# Patient Record
Sex: Female | Born: 1935 | Race: White | Hispanic: No | Smoking: Former smoker
Health system: Southern US, Community
[De-identification: ages and names within clinical notes are randomized; demographics above are authoritative.]

## PROBLEM LIST (undated history)

## (undated) DIAGNOSIS — I10 Essential (primary) hypertension: Secondary | ICD-10-CM

## (undated) DIAGNOSIS — K5790 Diverticulosis of intestine, part unspecified, without perforation or abscess without bleeding: Secondary | ICD-10-CM

## (undated) DIAGNOSIS — R911 Solitary pulmonary nodule: Secondary | ICD-10-CM

## (undated) DIAGNOSIS — F4310 Post-traumatic stress disorder, unspecified: Secondary | ICD-10-CM

## (undated) DIAGNOSIS — M8580 Other specified disorders of bone density and structure, unspecified site: Secondary | ICD-10-CM

## (undated) DIAGNOSIS — I499 Cardiac arrhythmia, unspecified: Secondary | ICD-10-CM

## (undated) DIAGNOSIS — K219 Gastro-esophageal reflux disease without esophagitis: Secondary | ICD-10-CM

## (undated) DIAGNOSIS — E785 Hyperlipidemia, unspecified: Secondary | ICD-10-CM

## (undated) DIAGNOSIS — D649 Anemia, unspecified: Secondary | ICD-10-CM

## (undated) DIAGNOSIS — A64 Unspecified sexually transmitted disease: Secondary | ICD-10-CM

## (undated) HISTORY — DX: Hyperlipidemia, unspecified: E78.5

## (undated) HISTORY — DX: Diverticulosis of intestine, part unspecified, without perforation or abscess without bleeding: K57.90

## (undated) HISTORY — DX: Essential (primary) hypertension: I10

## (undated) HISTORY — DX: Unspecified sexually transmitted disease: A64

## (undated) HISTORY — DX: Anemia, unspecified: D64.9

## (undated) HISTORY — PX: CERVICAL BIOPSY  W/ LOOP ELECTRODE EXCISION: SUR135

## (undated) HISTORY — DX: Solitary pulmonary nodule: R91.1

## (undated) HISTORY — PX: COLPOSCOPY: SHX161

## (undated) HISTORY — DX: Post-traumatic stress disorder, unspecified: F43.10

## (undated) HISTORY — DX: Other specified disorders of bone density and structure, unspecified site: M85.80

## (undated) HISTORY — PX: APPENDECTOMY: SHX54

## (undated) HISTORY — PX: TONSILLECTOMY: SUR1361

## (undated) HISTORY — PX: KNEE SURGERY: SHX244

## (undated) HISTORY — DX: Gastro-esophageal reflux disease without esophagitis: K21.9

---

## 1998-02-25 ENCOUNTER — Other Ambulatory Visit: Admission: RE | Admit: 1998-02-25 | Discharge: 1998-02-25 | Payer: Self-pay | Admitting: Obstetrics and Gynecology

## 1999-04-16 ENCOUNTER — Other Ambulatory Visit: Admission: RE | Admit: 1999-04-16 | Discharge: 1999-04-16 | Payer: Self-pay | Admitting: Obstetrics and Gynecology

## 1999-08-03 ENCOUNTER — Other Ambulatory Visit: Admission: RE | Admit: 1999-08-03 | Discharge: 1999-08-03 | Payer: Self-pay | Admitting: Obstetrics and Gynecology

## 1999-08-03 ENCOUNTER — Encounter (INDEPENDENT_AMBULATORY_CARE_PROVIDER_SITE_OTHER): Payer: Self-pay | Admitting: Specialist

## 2000-08-02 ENCOUNTER — Other Ambulatory Visit: Admission: RE | Admit: 2000-08-02 | Discharge: 2000-08-02 | Payer: Self-pay | Admitting: Obstetrics and Gynecology

## 2000-09-22 ENCOUNTER — Encounter: Admission: RE | Admit: 2000-09-22 | Discharge: 2000-09-22 | Payer: Self-pay | Admitting: Obstetrics and Gynecology

## 2000-09-22 ENCOUNTER — Encounter: Payer: Self-pay | Admitting: Obstetrics and Gynecology

## 2001-07-31 ENCOUNTER — Other Ambulatory Visit: Admission: RE | Admit: 2001-07-31 | Discharge: 2001-07-31 | Payer: Self-pay | Admitting: Obstetrics and Gynecology

## 2001-12-11 ENCOUNTER — Encounter: Payer: Self-pay | Admitting: Obstetrics and Gynecology

## 2001-12-11 ENCOUNTER — Encounter: Admission: RE | Admit: 2001-12-11 | Discharge: 2001-12-11 | Payer: Self-pay | Admitting: Obstetrics and Gynecology

## 2002-04-16 HISTORY — PX: FACIAL COSMETIC SURGERY: SHX629

## 2002-07-24 ENCOUNTER — Other Ambulatory Visit: Admission: RE | Admit: 2002-07-24 | Discharge: 2002-07-24 | Payer: Self-pay | Admitting: Obstetrics and Gynecology

## 2002-09-16 DIAGNOSIS — K5790 Diverticulosis of intestine, part unspecified, without perforation or abscess without bleeding: Secondary | ICD-10-CM

## 2002-09-16 HISTORY — DX: Diverticulosis of intestine, part unspecified, without perforation or abscess without bleeding: K57.90

## 2002-10-12 ENCOUNTER — Ambulatory Visit (HOSPITAL_COMMUNITY): Admission: RE | Admit: 2002-10-12 | Discharge: 2002-10-12 | Payer: Self-pay | Admitting: Gastroenterology

## 2002-10-12 ENCOUNTER — Encounter (INDEPENDENT_AMBULATORY_CARE_PROVIDER_SITE_OTHER): Payer: Self-pay | Admitting: Specialist

## 2002-12-18 ENCOUNTER — Encounter: Admission: RE | Admit: 2002-12-18 | Discharge: 2002-12-18 | Payer: Self-pay | Admitting: Obstetrics and Gynecology

## 2002-12-18 ENCOUNTER — Encounter: Payer: Self-pay | Admitting: Obstetrics and Gynecology

## 2003-08-15 ENCOUNTER — Other Ambulatory Visit: Admission: RE | Admit: 2003-08-15 | Discharge: 2003-08-15 | Payer: Self-pay | Admitting: Obstetrics and Gynecology

## 2003-10-03 ENCOUNTER — Encounter: Admission: RE | Admit: 2003-10-03 | Discharge: 2003-10-03 | Payer: Self-pay | Admitting: Internal Medicine

## 2004-05-22 ENCOUNTER — Encounter: Admission: RE | Admit: 2004-05-22 | Discharge: 2004-05-22 | Payer: Self-pay | Admitting: Internal Medicine

## 2004-05-27 ENCOUNTER — Encounter: Admission: RE | Admit: 2004-05-27 | Discharge: 2004-05-27 | Payer: Self-pay | Admitting: Internal Medicine

## 2004-08-19 ENCOUNTER — Other Ambulatory Visit: Admission: RE | Admit: 2004-08-19 | Discharge: 2004-08-19 | Payer: Self-pay | Admitting: Obstetrics and Gynecology

## 2005-07-07 ENCOUNTER — Encounter: Admission: RE | Admit: 2005-07-07 | Discharge: 2005-07-07 | Payer: Self-pay | Admitting: Internal Medicine

## 2005-09-14 ENCOUNTER — Other Ambulatory Visit: Admission: RE | Admit: 2005-09-14 | Discharge: 2005-09-14 | Payer: Self-pay | Admitting: Obstetrics and Gynecology

## 2005-10-07 ENCOUNTER — Encounter: Admission: RE | Admit: 2005-10-07 | Discharge: 2005-10-07 | Payer: Self-pay | Admitting: Obstetrics and Gynecology

## 2006-09-15 ENCOUNTER — Other Ambulatory Visit: Admission: RE | Admit: 2006-09-15 | Discharge: 2006-09-15 | Payer: Self-pay | Admitting: Obstetrics and Gynecology

## 2006-09-16 ENCOUNTER — Encounter: Admission: RE | Admit: 2006-09-16 | Discharge: 2006-09-16 | Payer: Self-pay | Admitting: Internal Medicine

## 2007-05-16 ENCOUNTER — Encounter: Admission: RE | Admit: 2007-05-16 | Discharge: 2007-06-08 | Payer: Self-pay | Admitting: Internal Medicine

## 2007-09-19 ENCOUNTER — Ambulatory Visit: Payer: Self-pay | Admitting: Family Medicine

## 2007-09-27 ENCOUNTER — Other Ambulatory Visit: Admission: RE | Admit: 2007-09-27 | Discharge: 2007-09-27 | Payer: Self-pay | Admitting: Obstetrics and Gynecology

## 2007-09-28 DIAGNOSIS — E669 Obesity, unspecified: Secondary | ICD-10-CM | POA: Insufficient documentation

## 2007-10-24 ENCOUNTER — Ambulatory Visit: Payer: Self-pay | Admitting: Family Medicine

## 2007-10-26 ENCOUNTER — Ambulatory Visit: Payer: Self-pay | Admitting: *Deleted

## 2007-11-02 ENCOUNTER — Encounter: Admission: RE | Admit: 2007-11-02 | Discharge: 2007-11-02 | Payer: Self-pay | Admitting: Internal Medicine

## 2007-11-28 ENCOUNTER — Encounter: Admission: RE | Admit: 2007-11-28 | Discharge: 2007-11-28 | Payer: Self-pay | Admitting: Internal Medicine

## 2007-12-20 ENCOUNTER — Encounter: Admission: RE | Admit: 2007-12-20 | Discharge: 2007-12-20 | Payer: Self-pay | Admitting: Gastroenterology

## 2007-12-20 IMAGING — RF DG ESOPHAGUS
15 of 18 series · 20 of 24 positions shown · non-contrast
Comparison: None

CLINICAL DATA: Dysphasia---- fluoroscopy time of 1.7-minute

BARIUM SWALLOW / ESOPHAGRAM

[Series 2: run · 1 of 1 slices shown (1 of 15)]
[im 1/1]
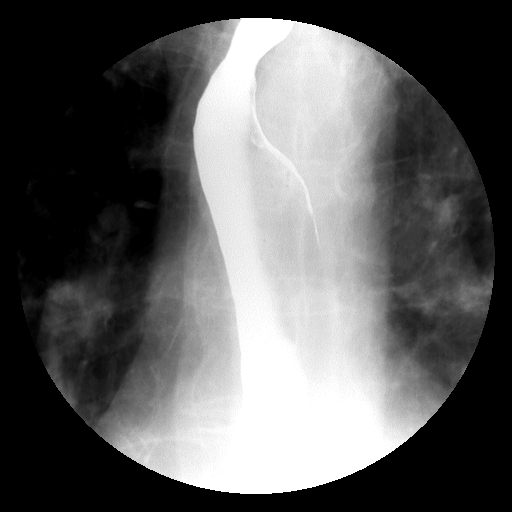

[Series 3: run · 1 of 1 slices shown (2 of 15)]
[im 1/1]
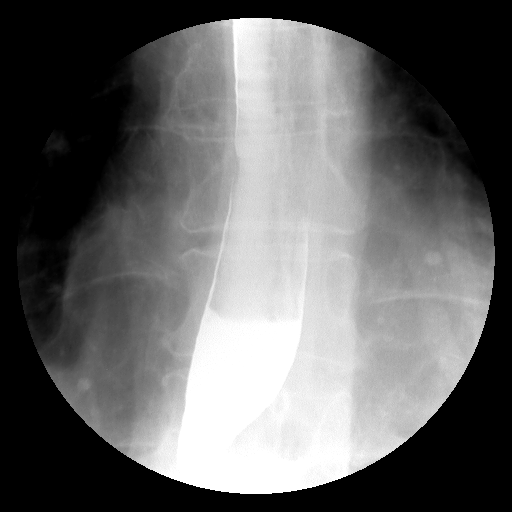

[Series 5: run · 1 of 1 slices shown (3 of 15)]
[im 1/1]
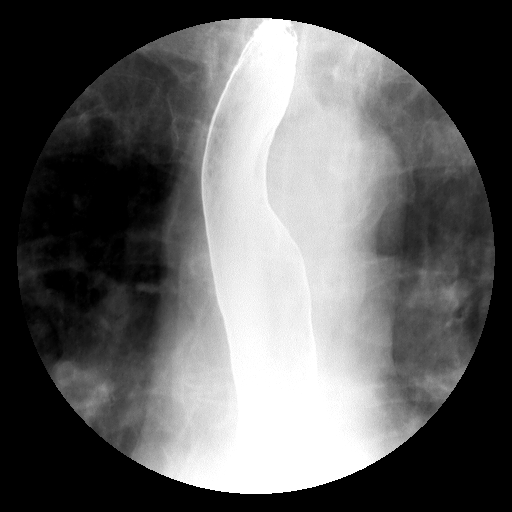

[Series 6: run · 1 of 1 slices shown (4 of 15)]
[im 1/1]
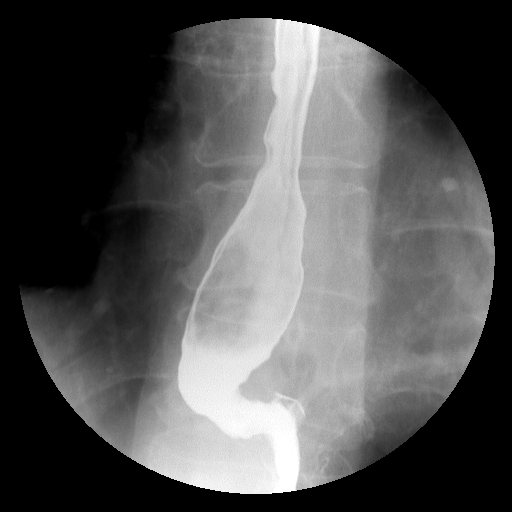

[Series 7: run · 3 of 7 slices shown (5 of 15)]
[im 1/7]
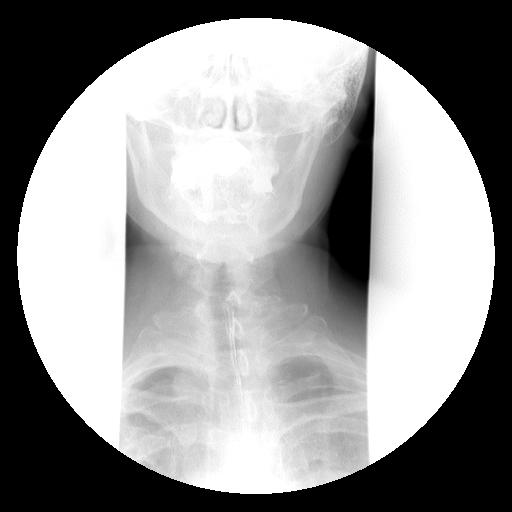
[im 3/7]
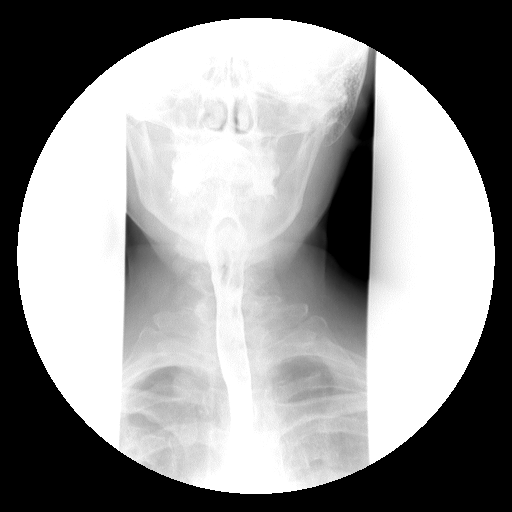
[im 5/7]
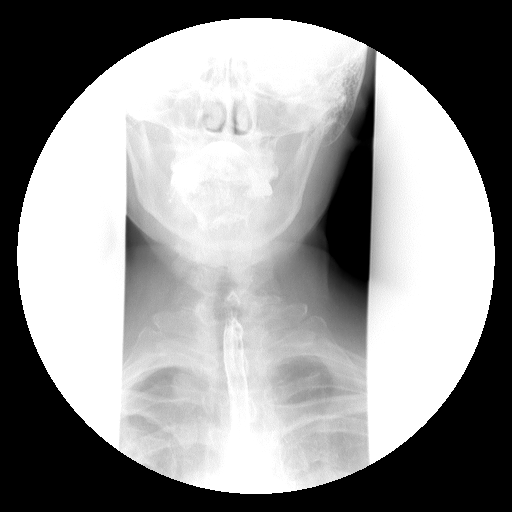

[Series 8: run · 4 of 6 slices shown (6 of 15)]
[im 1/6]
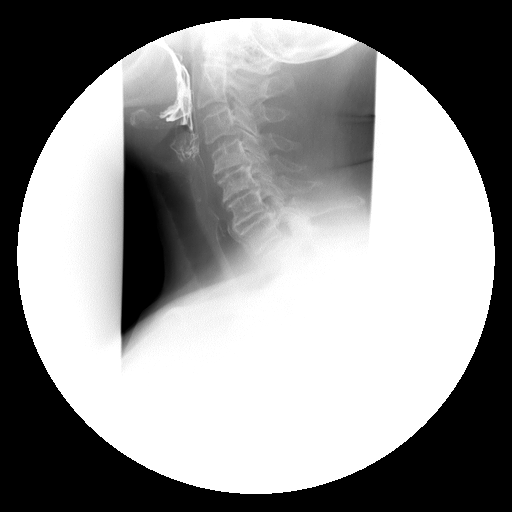
[im 2/6]
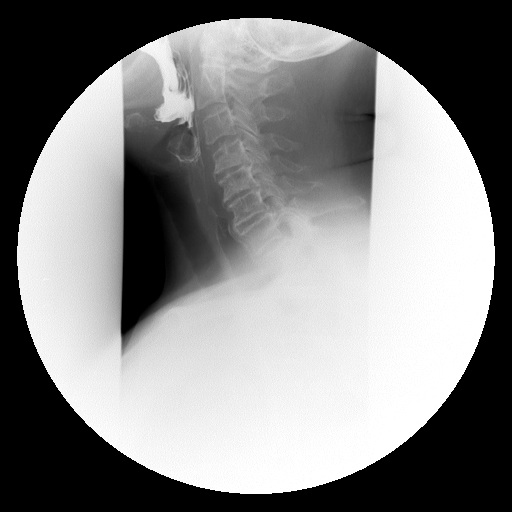
[im 4/6]
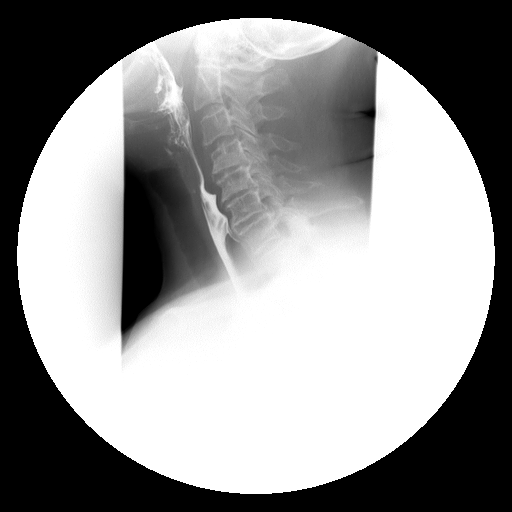
[im 6/6]
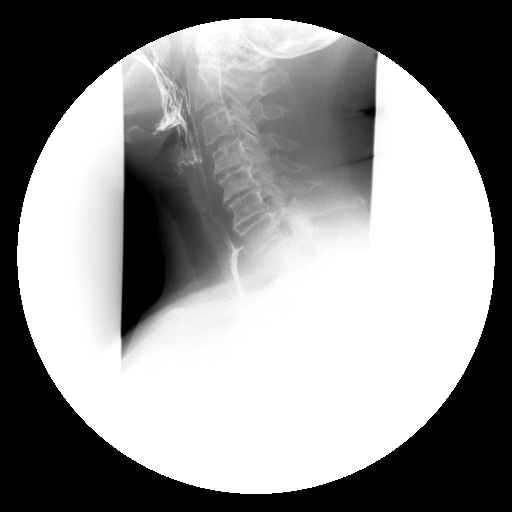

[Series 9: run · 1 of 1 slices shown (7 of 15)]
[im 1/1]
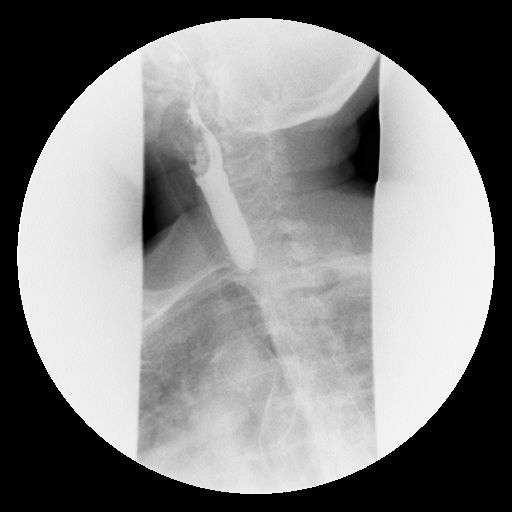

[Series 11: run · 1 of 1 slices shown (8 of 15)]
[im 1/1]
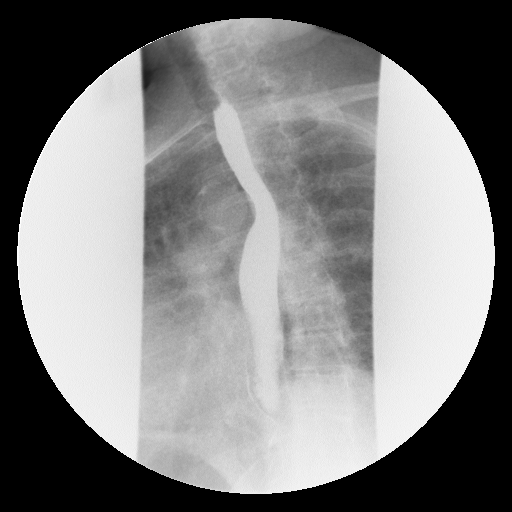

[Series 12: run · 1 of 1 slices shown (9 of 15)]
[im 1/1]
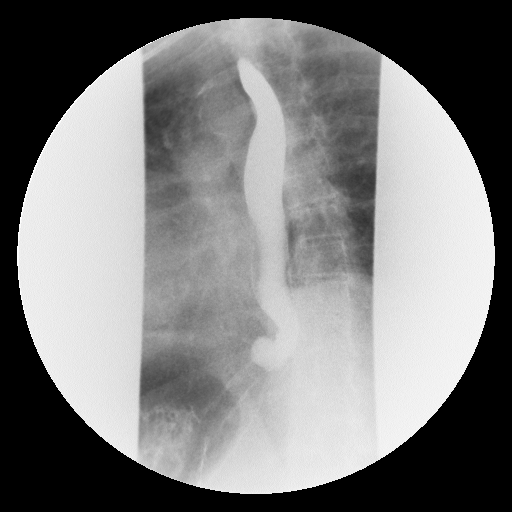

[Series 13: run · 1 of 1 slices shown (10 of 15)]
[im 1/1]
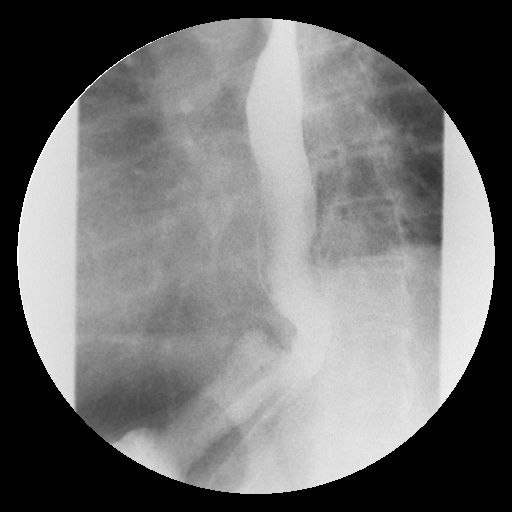

[Series 14: run · 1 of 1 slices shown (11 of 15)]
[im 1/1]
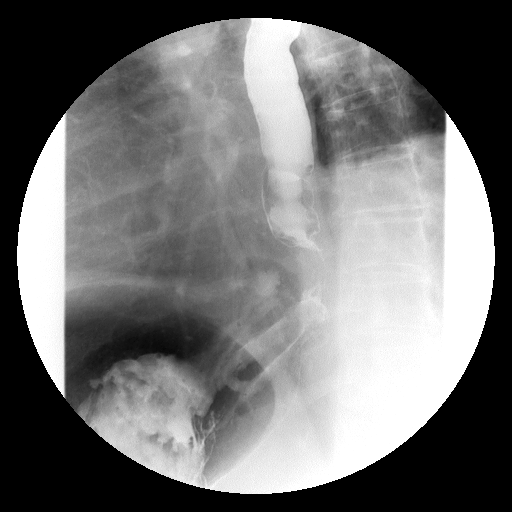

[Series 15: run · 1 of 1 slices shown (12 of 15)]
[im 1/1]
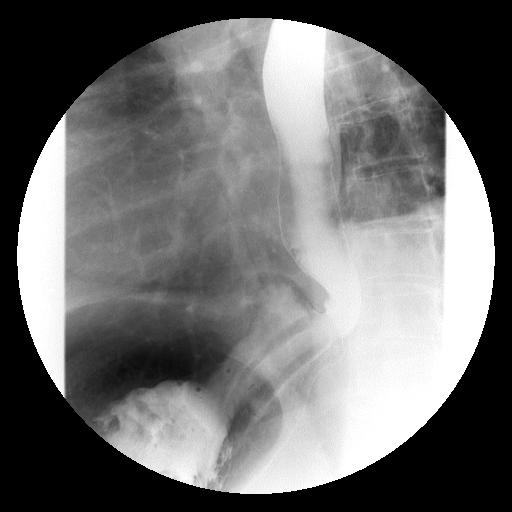

[Series 17: run · 1 of 1 slices shown (13 of 15)]
[im 1/1]
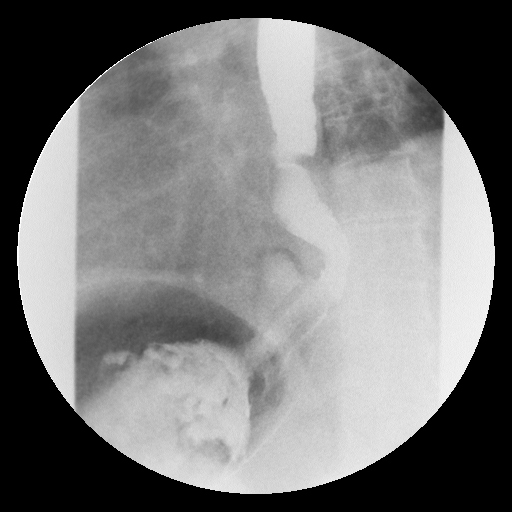

[Series 19: run · 1 of 1 slices shown (14 of 15)]
[im 1/1]
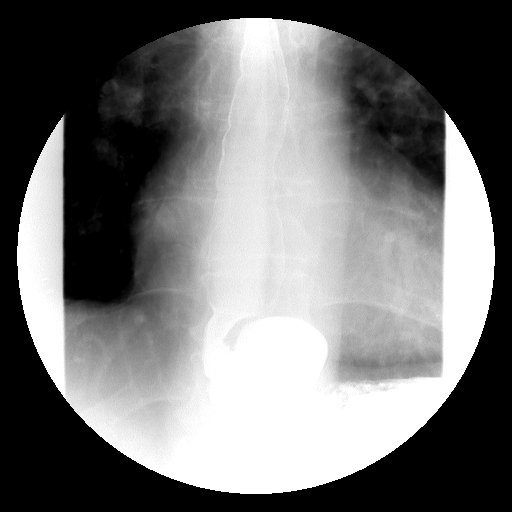

[Series 20: run · 1 of 1 slices shown (15 of 15)]
[im 1/1]
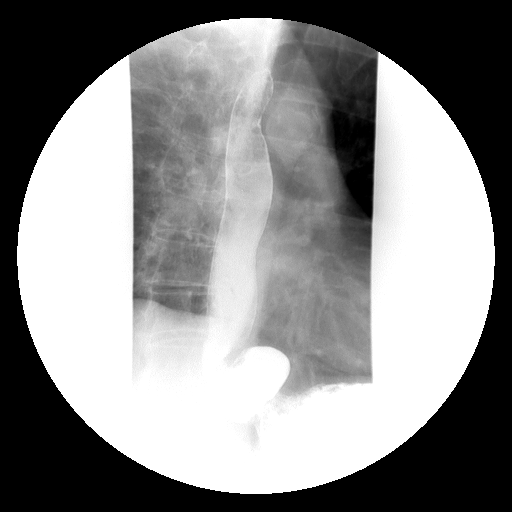

[20 of 24 positions shown; findings below may reference images not displayed]

FINDINGS: Double contrast upper GI shows the mucosa of the
esophagus to be normal.  A single contrast study shows the
swallowing mechanism to be normal.  There is a small hiatal hernia
present with mild to moderate gastroesophageal reflux.  Mild
tertiary contractions are noted distally.  Barium pill was given at
the end of the study which did pass into the stomach without delay.
IMPRESSION: 1.  Small hiatal hernia with mild to moderate reflux.
2.  Barium pill passes into the stomach without delay.

## 2008-04-24 ENCOUNTER — Ambulatory Visit (HOSPITAL_COMMUNITY): Admission: RE | Admit: 2008-04-24 | Discharge: 2008-04-24 | Payer: Self-pay | Admitting: Gastroenterology

## 2008-10-02 ENCOUNTER — Other Ambulatory Visit: Admission: RE | Admit: 2008-10-02 | Discharge: 2008-10-02 | Payer: Self-pay | Admitting: Obstetrics and Gynecology

## 2008-11-06 ENCOUNTER — Encounter: Admission: RE | Admit: 2008-11-06 | Discharge: 2008-11-06 | Payer: Self-pay | Admitting: Internal Medicine

## 2009-06-02 ENCOUNTER — Encounter: Admission: RE | Admit: 2009-06-02 | Discharge: 2009-06-02 | Payer: Self-pay | Admitting: Gastroenterology

## 2009-06-24 ENCOUNTER — Ambulatory Visit (HOSPITAL_COMMUNITY): Admission: RE | Admit: 2009-06-24 | Discharge: 2009-06-24 | Payer: Self-pay | Admitting: Gastroenterology

## 2009-09-24 ENCOUNTER — Encounter: Payer: Self-pay | Admitting: Cardiovascular Disease

## 2010-02-02 ENCOUNTER — Encounter: Admission: RE | Admit: 2010-02-02 | Discharge: 2010-02-02 | Payer: Self-pay | Admitting: Gastroenterology

## 2010-02-11 ENCOUNTER — Encounter: Admission: RE | Admit: 2010-02-11 | Discharge: 2010-02-11 | Payer: Self-pay | Admitting: Internal Medicine

## 2010-04-06 ENCOUNTER — Encounter: Admission: RE | Admit: 2010-04-06 | Discharge: 2010-04-06 | Payer: Self-pay | Admitting: Internal Medicine

## 2010-09-05 ENCOUNTER — Encounter: Payer: Self-pay | Admitting: Internal Medicine

## 2010-09-15 NOTE — Assessment & Plan Note (Signed)
Summary: Wt Mgmt Class / JCS               ]

## 2010-09-15 NOTE — Assessment & Plan Note (Signed)
Summary: f/u with dr Saheed Carrington/bmc               ]

## 2010-12-23 ENCOUNTER — Ambulatory Visit (HOSPITAL_COMMUNITY)
Admission: RE | Admit: 2010-12-23 | Discharge: 2010-12-23 | Disposition: A | Discharge status: Home / Self Care | Payer: Medicare Other | Source: Ambulatory Visit | Attending: Gastroenterology | Admitting: Gastroenterology

## 2010-12-23 DIAGNOSIS — K449 Diaphragmatic hernia without obstruction or gangrene: Secondary | ICD-10-CM | POA: Insufficient documentation

## 2010-12-23 DIAGNOSIS — K222 Esophageal obstruction: Secondary | ICD-10-CM | POA: Insufficient documentation

## 2010-12-23 DIAGNOSIS — IMO0002 Reserved for concepts with insufficient information to code with codable children: Secondary | ICD-10-CM | POA: Insufficient documentation

## 2010-12-23 DIAGNOSIS — T18108A Unspecified foreign body in esophagus causing other injury, initial encounter: Secondary | ICD-10-CM | POA: Insufficient documentation

## 2010-12-29 NOTE — Op Note (Signed)
[name], Jacobs                ACCOUNT NO.:  [id number]   MEDICAL RECORD NO.:  [id number]          PATIENT TYPE:  AMB   LOCATION:  ENDO                         FACILITY:  [facility]   PHYSICIAN:  Bernette Redbird, M.D.   DATE OF BIRTH:  01/03/1936   DATE OF PROCEDURE:  04/24/2008  DATE OF DISCHARGE:                               OPERATIVE REPORT   PROCEDURE:  Upper endoscopy with Savary dilatation of the esophagus  under fluoroscopy.   INDICATIONS:  Significant dysphagia symptoms in a 75 year old female,  not relieved by PPI therapy.   FINDINGS:  Moderately tight esophageal ring-like stricture at the GE  junction, dilated to 18 mm by Savary technique.   PROCEDURE:  The nature, purpose, and risks of the procedure have been  discussed with the patient who provided written consent after coming as  an outpatient to the [facility].  Sedation was fentanyl  100 mcg and Versed 10 mg IV without clinical instability.  The Pentax  video endoscope was passed under direct vision.  There might have been a  little bit of thickening of the posterior commissure of the larynx, but  there was no overt severe reflux laryngitis.  The esophagus was entered  without significant difficulty.   Right at the GE junction, there was a little bit of focal erosive  change, perhaps due to an impacted pill.  It did not look like classic  linear reflux esophagitis.  There was also a rather tight ring, which  offered resistance to passage of the 10-mm endoscope, but it was able to  pop through there into a small hiatal hernia, which was below the ring  and from there into a normal-appearing stomach, free of gastritis,  erosions, ulcers, polyps, or masses including a retroflexed view of the  cardia.  The pylorus, duodenal bulb, and the second duodenum looked  normal.   Savary dilatation was then performed in the standard fashion.  The  spring-tipped guidewire was passed through the scope into the  antrum of  the stomach, the scope was removed in an exchange fashion, the position  of the wire was confirmed fluoroscopically, and 14- and 16-mm Savary  dilators were slid over the guidewire under fluoroscopic guidance,  confirming passage of the widest portion of the dilator below the level  of the diaphragm in each case.  The patient was then re-endoscoped under  direct vision, which showed some fracture of the ring and a small amount  of fresh hemorrhage but no severe mucosal disruption, and still a little  bit of resistance to passage of the scope, so I went ahead and proceeded  dilatation with an 18-mm dilator in the same fashion.  The patient was  then re-endoscoped, which showed free passage of the scope into the  stomach and again a small amount of hemorrhage but no evidence of undue  trauma to the esophagus or hypopharynx.   The patient tolerated the procedure well, and there were no apparent  complications.   IMPRESSION:  Moderately tight ring-like stricture at the  gastroesophageal junction, no doubt accounting for the  patient's  dysphagia symptoms, dilated to 18 mm by Savary technique as described  above.   PLAN:  Clinical followup of dysphagia symptoms.           ______________________________  Bernette Redbird, M.D.     RB/MEDQ  D:  04/24/2008  T:  04/25/2008  Job:  [id number]   cc:   Gwen Pounds, MD

## 2011-01-01 NOTE — Op Note (Signed)
NAME:  Marissa Jacobs, Marissa Jacobs                          ACCOUNT NO.:  [id number]   MEDICAL RECORD NO.:  [id number]                   PATIENT TYPE:  AMB   LOCATION:  ENDO                                 FACILITY:  [facility]   PHYSICIAN:  Bernette Redbird, M.D.                DATE OF BIRTH:  Apr 18, 1936   DATE OF PROCEDURE:  10/12/2002  DATE OF DISCHARGE:                                 OPERATIVE REPORT   PROCEDURE:  Colonoscopy with biopsies.   INDICATIONS:  Colon cancer screening in a 75 year old female who had a  negative flexible sigmoidoscopy to 50 cm in the fairly recent past and does  not have any worrisome symptoms.   FINDINGS:  Mild left-side diverticulosis.  Diminutive hyperplastic-appearing  rectal polyps.   DESCRIPTION OF PROCEDURE:  The nature, purpose, and risks of the procedure  had been discussed with the patient, who provided written consent.  Sedation  was fentanyl 100 mcg and Versed 10 mg IV prior to and during the course of  the procedure without arrhythmias or significant desaturation.  The minimal  O2 saturation was approximately 88%.   The Olympus adjustable-tension pediatric video colonoscope was advanced  around the colon to the cecum as identified by visualization of the  ileocecal valve and the absence of further lumen, and pullback was then  performed.  The quality of the prep was excellent, and it is felt that all  areas were well-seen.   There were a couple of diminutive 2-3 mm sessile polyps in the rectum  removed by cold biopsy.   No larger polyps were seen, and there was no evidence of cancer, colitis,  vascular malformations, or other abnormalities except for a little bit of  left-side diverticulosis.   Retroflexion was not performed in the rectum, but reinspection of the rectum  disclosed no additional findings.  The patient tolerated the procedure well,  and there were no apparent complications.   It should be noted that scope advancement was fairly  easy, but we did  require some abdominal compression to help facilitate advancement and to  control looping.   IMPRESSION:  1. Diminutive rectal polyps.  2. Minimal left-side diverticulosis.   PLAN:  Await pathology on the biopsies, with anticipated follow-up, either a  flexible sigmoidoscopy or colonoscopy, in about five years depending on the  histologic findings.                                               Bernette Redbird, M.D.    RB/MEDQ  D:  10/12/2002  T:  10/12/2002  Job:  [id number]   cc:   Gwen Pounds, M.D.  [address]  [place]  [place] [address]  Fax: [phone]   Edwena Felty. Romine, M.D.  [address]  [place]  [place] [address]  Fax: [phone]

## 2011-04-09 ENCOUNTER — Other Ambulatory Visit: Payer: Self-pay | Admitting: Internal Medicine

## 2011-04-09 DIAGNOSIS — Z1231 Encounter for screening mammogram for malignant neoplasm of breast: Secondary | ICD-10-CM

## 2011-04-21 ENCOUNTER — Ambulatory Visit
Admission: RE | Admit: 2011-04-21 | Discharge: 2011-04-21 | Disposition: A | Discharge status: Home / Self Care | Payer: Medicare Other | Source: Ambulatory Visit | Attending: Internal Medicine | Admitting: Internal Medicine

## 2011-04-21 DIAGNOSIS — Z1231 Encounter for screening mammogram for malignant neoplasm of breast: Secondary | ICD-10-CM

## 2011-04-23 ENCOUNTER — Other Ambulatory Visit: Payer: Self-pay | Admitting: Dermatology

## 2011-06-29 ENCOUNTER — Other Ambulatory Visit: Payer: Self-pay | Admitting: Gastroenterology

## 2011-07-21 ENCOUNTER — Ambulatory Visit
Admission: RE | Admit: 2011-07-21 | Discharge: 2011-07-21 | Disposition: A | Discharge status: Home / Self Care | Payer: Medicare Other | Source: Ambulatory Visit | Attending: Gastroenterology | Admitting: Gastroenterology

## 2011-08-17 HISTORY — PX: ESOPHAGEAL DILATION: SHX303

## 2011-10-01 ENCOUNTER — Other Ambulatory Visit: Payer: Self-pay | Admitting: Internal Medicine

## 2011-10-01 DIAGNOSIS — E041 Nontoxic single thyroid nodule: Secondary | ICD-10-CM

## 2011-10-04 ENCOUNTER — Ambulatory Visit
Admission: RE | Admit: 2011-10-04 | Discharge: 2011-10-04 | Disposition: A | Discharge status: Home / Self Care | Payer: Medicare Other | Source: Ambulatory Visit | Attending: Internal Medicine | Admitting: Internal Medicine

## 2011-10-04 DIAGNOSIS — E041 Nontoxic single thyroid nodule: Secondary | ICD-10-CM

## 2012-03-24 ENCOUNTER — Other Ambulatory Visit: Payer: Self-pay | Admitting: Dermatology

## 2012-05-01 ENCOUNTER — Encounter: Payer: Self-pay | Admitting: *Deleted

## 2012-05-01 ENCOUNTER — Encounter: Payer: Medicare Other | Attending: Internal Medicine | Admitting: *Deleted

## 2012-05-01 VITALS — Ht 62.0 in | Wt 145.8 lb

## 2012-05-01 DIAGNOSIS — Z713 Dietary counseling and surveillance: Secondary | ICD-10-CM | POA: Insufficient documentation

## 2012-05-01 DIAGNOSIS — E785 Hyperlipidemia, unspecified: Secondary | ICD-10-CM | POA: Insufficient documentation

## 2012-05-01 NOTE — Progress Notes (Signed)
  Medical Nutrition Therapy:  Appt start time: 1100 end time:  1200.   Assessment:  Primary concerns today: hyperlipidemia.   MEDICATIONS: see list   DIETARY INTAKE:  Usual eating pattern includes 2 meals and 1-3 snacks per day.  Everyday foods include whole grains, lean proteins, fruits, and vegetables.  Avoided foods include high fat and most concentrated sweets.    24-hr recall:  B ( AM): cereal with skim milk; english muffin with low-fat cheese or cinnamon and fruit spread; smoothies (fruit, veggies, whey protein or flax); yogurt with slivered almonds; fruit; may have egg on wheat toast.  coffee Snk ( AM): not usually.  May have apple slices with peanut butter or nuts  L ( PM): may skip.  May have smoothie Snk ( PM): sometimes has part skim milk cheese with crackers and grapes or banana or orange D ( PM): fish (grilled or broiled) with vegetables or chicken; soup Snk ( PM): weight watchers fudgesicle or rare occasion lemon pie Beverages: green tea, skim milk, lemonade sometimes, zero calorie flavored water (not much plain water) Used to eat more sweets, but recently stopped.  Used to eat out more, but recently lessened  Usual physical activity: works out with trainer 1 day and by self 3 days (weights) and walks 5 days (30 min)  Estimated energy needs: 1500 calories 170 g carbohydrates 112 g protein 42 g fat  Progress Towards Goal(s):  In progress.   Nutritional Diagnosis:  NI-5.6.2 Excessive fat intake As related to history of high consumption of sweets and eating out, coupled with low physical activity.  As evidenced by overweight status and hyperlipidemia.    Intervention:  Nutrition counseling provided.  Unfortunately Marissa Jacobs was late to her appointment so her visit time was cut short.  However, spent time explaining lab results and the different types of blood lipids.  Clarified that her TAG were fine and HDL was fine- just LDL and total cholesterol were high.  Discussed  tips to reduce cholesterol.  Encouraged cardio exercise 3-4 days/week in addition to her usual lifting weights.  Encouraged whole grains, fruits, vegetables, and using olive oil in cooking.  Discussed [employer] as dietary resource.  Encouraged eating 3 meals a day and to avoid meal skipping.  Encouraged lean proteins at every meal and gradual weight loss of 2 lb/week  Handouts given during visit include:  1500 calorie meal plan  Reading food labels  Tips to reduce cholesterol and low sodium seasonings  Monitoring/Evaluation:  Dietary intake, exercise, and body weight in 1 month(s).

## 2012-05-01 NOTE — Patient Instructions (Signed)
Goals:  Eat 3 meals/day, Avoid meal skipping   Increase protein rich foods  Follow "Plate Method" for portion control  Limit carbohydrate1-2 servings/meal   Choose more whole grains, lean protein, low-fat dairy, and fruits/non-starchy vegetables.   Aim for >30 min of physical activity daily  Limit sugar-sweetened beverages and concentrated sweets  Follow tips to reduce cholesterol

## 2012-05-11 ENCOUNTER — Ambulatory Visit: Payer: Medicare Other | Admitting: Dietician

## 2012-05-29 ENCOUNTER — Ambulatory Visit: Payer: Medicare Other | Admitting: *Deleted

## 2012-06-15 ENCOUNTER — Other Ambulatory Visit: Payer: Self-pay | Admitting: Internal Medicine

## 2012-06-15 DIAGNOSIS — Z1231 Encounter for screening mammogram for malignant neoplasm of breast: Secondary | ICD-10-CM

## 2012-06-29 ENCOUNTER — Ambulatory Visit: Payer: Medicare Other | Admitting: *Deleted

## 2012-07-21 ENCOUNTER — Ambulatory Visit
Admission: RE | Admit: 2012-07-21 | Discharge: 2012-07-21 | Disposition: A | Discharge status: Home / Self Care | Payer: Medicare Other | Source: Ambulatory Visit | Attending: Internal Medicine | Admitting: Internal Medicine

## 2012-07-21 DIAGNOSIS — Z1231 Encounter for screening mammogram for malignant neoplasm of breast: Secondary | ICD-10-CM

## 2012-07-28 ENCOUNTER — Encounter: Payer: Self-pay | Admitting: Dietician

## 2012-07-28 ENCOUNTER — Encounter: Payer: Medicare Other | Attending: Internal Medicine | Admitting: Dietician

## 2012-07-28 VITALS — Wt 138.5 lb

## 2012-07-28 DIAGNOSIS — E785 Hyperlipidemia, unspecified: Secondary | ICD-10-CM | POA: Insufficient documentation

## 2012-07-28 DIAGNOSIS — Z713 Dietary counseling and surveillance: Secondary | ICD-10-CM | POA: Insufficient documentation

## 2012-07-28 NOTE — Progress Notes (Signed)
A: Pt reports some meal skipping. Has some trouble with appetite, especially in terms of protein options.  Pt has moderate wt loss of 7 lbs. Since last visit. Her stated goal is to return to previous normal wt range of 120-125 lbs.  Pt is curious about options for quick, high protein, transportable foods.   Pt intends to begin structured cardio routine- wants to know about duration, heart rate range, calculating heart rate range, etc.  Pt is concerned about certain sugar free foods she eats with her SO (DM II).   Pt also curious about kcal content of alcoholic beverages.  Dx: Food and nutrition-related knowledge deficit related to protein options, protein intake, kcal content of foods, as evidenced by pt questions regarding these topics.  I: Pt educated regarding kcal content of certain foods, necessary daily protein intake, including adequate dairy choices for protein intake, calcium content.  Pt provided instruction to continue previous dietary changes (low fat intake, kcal control for about 1500 kcal meal plan), and advised to continue exercise routine with 3 days per week cardio at minimum 20 minutes per session of any enjoyable activity. Pt provided Karvonen method for heart rate range calculation, also instructed on talk test to determine moderate versus vigorous intensity.  Pt has been advised to continue current wt loss goals, but to avoid dropping wt below previous norm to prevent loss of lean tissue. Pt also instructed to eat minimum 2 servings dairy plus 6 oz. Meat/chix/fish/veg protein food each day to ensure adequate protein intake.  In regards to sugar free foods, RD explained why diabetic patients should eat those foods, and that those foods are perfectly fine for non-diabetics as well.  M/E: Follow up in about 2 months, after next meeting with PCP and updated labs drawn for LDL, total cholesterol.

## 2012-08-01 ENCOUNTER — Other Ambulatory Visit: Payer: Self-pay | Admitting: Dermatology

## 2012-08-04 ENCOUNTER — Other Ambulatory Visit: Payer: Self-pay | Admitting: Internal Medicine

## 2012-08-04 DIAGNOSIS — M899 Disorder of bone, unspecified: Secondary | ICD-10-CM

## 2012-08-04 DIAGNOSIS — M949 Disorder of cartilage, unspecified: Secondary | ICD-10-CM

## 2012-08-11 ENCOUNTER — Other Ambulatory Visit: Payer: Medicare Other

## 2012-08-22 ENCOUNTER — Other Ambulatory Visit: Payer: Medicare Other

## 2012-09-29 ENCOUNTER — Ambulatory Visit: Payer: Medicare Other | Admitting: Dietician

## 2012-10-06 ENCOUNTER — Ambulatory Visit: Payer: Medicare Other | Admitting: *Deleted

## 2012-12-15 ENCOUNTER — Telehealth: Payer: Self-pay | Admitting: Obstetrics and Gynecology

## 2012-12-15 NOTE — Telephone Encounter (Signed)
Pt says her iron is low and wondering if she should start taking vitamin d.

## 2012-12-15 NOTE — Telephone Encounter (Signed)
Patient called with her Vitamin D results of 2 days ago. Was told it was 30. Explained due to new guidelines her results were within a good normal range and may use OTC vitamin d 600-800IU daily. Patient understood this. Patient is also concerned of her HGB report but does not know what it was and will call back on Monday with this. States Dr. Matthias Hughs her GI doctor was to have faxed this report here to our office. sue

## 2013-02-14 ENCOUNTER — Telehealth: Payer: Self-pay | Admitting: Obstetrics and Gynecology

## 2013-02-14 NOTE — Telephone Encounter (Signed)
Patient has some itching , very uncomfortable. Can you call  In something or does she need an appointment?

## 2013-02-14 NOTE — Telephone Encounter (Signed)
Patient states is having vaginal itching intermittently. Not every day but is very irriatating. Has a out of town trip scheduled first of August and would like to get this resolved. Pharmacy [facility]. Please advise. Marissa Jacobs  Chart in your cabinet.

## 2013-02-14 NOTE — Telephone Encounter (Signed)
Needs OV.  

## 2013-02-15 NOTE — Telephone Encounter (Signed)
Appointment scheduled with Dr. Tresa Res for 02/20/13 cm

## 2013-02-20 ENCOUNTER — Encounter: Payer: Self-pay | Admitting: Certified Nurse Midwife

## 2013-02-20 ENCOUNTER — Encounter: Payer: Self-pay | Admitting: Obstetrics and Gynecology

## 2013-02-20 ENCOUNTER — Ambulatory Visit (INDEPENDENT_AMBULATORY_CARE_PROVIDER_SITE_OTHER): Payer: MEDICARE | Admitting: Obstetrics and Gynecology

## 2013-02-20 VITALS — BP 130/62 | Ht 61.0 in | Wt 138.0 lb

## 2013-02-20 DIAGNOSIS — L293 Anogenital pruritus, unspecified: Secondary | ICD-10-CM

## 2013-02-20 DIAGNOSIS — L292 Pruritus vulvae: Secondary | ICD-10-CM

## 2013-02-20 NOTE — Progress Notes (Signed)
77 yo WF here c/o vaginal itching and irritation. Is mostly on the right side of the vulva, and is intermittant over the last month.  She is seeing a new man and plans to go up to [place] to visit him in August and is concerned that this itching might interfere with sex.  They have not been active sexually yet.  She has not had sex for many months.     Exam:  Ext: appears perfectly normal.  Area she points to is right gluteal just outside the majora, but no lesions are seen.              Vagina: pink, healthy appearing              BM:  No masses or tenderness.  A:  intermittant vulvar irritation  P: clobetasol cream prn and instructed.  STD risk discussed.  Rec: olive oil w/sex as needed.  Questions answered.

## 2013-02-20 NOTE — Patient Instructions (Signed)
Apply cream sparingly twice a day if itching occurs.

## 2013-02-21 MED ORDER — CLOBETASOL PROPIONATE 0.05 % EX OINT: TOPICAL_OINTMENT | Freq: Two times a day (BID) | CUTANEOUS | Status: DC

## 2013-02-21 NOTE — Addendum Note (Signed)
Addended by: Alison Murray on: 02/21/2013 09:42 AM   Modules accepted: Orders

## 2013-03-09 ENCOUNTER — Ambulatory Visit (INDEPENDENT_AMBULATORY_CARE_PROVIDER_SITE_OTHER): Payer: MEDICARE | Admitting: Obstetrics and Gynecology

## 2013-03-09 ENCOUNTER — Telehealth: Payer: Self-pay | Admitting: Obstetrics and Gynecology

## 2013-03-09 ENCOUNTER — Encounter: Payer: Self-pay | Admitting: Obstetrics and Gynecology

## 2013-03-09 VITALS — BP 120/60 | HR 60 | Temp 98.0°F | Ht 61.0 in | Wt 136.0 lb

## 2013-03-09 DIAGNOSIS — B3731 Acute candidiasis of vulva and vagina: Secondary | ICD-10-CM

## 2013-03-09 DIAGNOSIS — B373 Candidiasis of vulva and vagina: Secondary | ICD-10-CM

## 2013-03-09 MED ORDER — NYSTATIN-TRIAMCINOLONE 100000-0.1 UNIT/GM-% EX OINT: TOPICAL_OINTMENT | Freq: Two times a day (BID) | CUTANEOUS | Status: DC

## 2013-03-09 NOTE — Patient Instructions (Signed)
Use the cream I prescribed twice a day until the redness goes away, usually it will take about 3-4 days.

## 2013-03-09 NOTE — Telephone Encounter (Signed)
Patient has rash in her vaginal area . Wants to see someone today.

## 2013-03-09 NOTE — Telephone Encounter (Signed)
Spoke with pt about appt for rash. Scheduled appt with CR today at 1:45.

## 2013-03-09 NOTE — Progress Notes (Signed)
77 yo SWF G2P1 stating she took a shower this am and when drying off, noted her vulvar was slightly tender.  She looked in the mirror and saw it was red.  She is worried it is something she could pass to her boyfriend, who she hasn't seen since March but is going to see this weekend.  There is no itching associated with the redness.    Exam:  Ext:  Erythema in creases of lat Lab maj bilaterally, c/w yeast dermatitis.  Mild in degree.  BUS nl.  Vag appears pink and healthy w/o discharge.  BM:  Uterus small, mobile, NT.  Adnexa NT and w/o mass.  A:  Yeast dermatitis of vulva  P:  Nystat/TAC cream bid until redness resolves.

## 2013-06-11 ENCOUNTER — Other Ambulatory Visit: Payer: Self-pay | Admitting: Internal Medicine

## 2013-06-11 ENCOUNTER — Other Ambulatory Visit: Payer: Self-pay

## 2013-06-11 DIAGNOSIS — M899 Disorder of bone, unspecified: Secondary | ICD-10-CM

## 2013-06-11 DIAGNOSIS — Z1231 Encounter for screening mammogram for malignant neoplasm of breast: Secondary | ICD-10-CM

## 2013-06-18 ENCOUNTER — Encounter (INDEPENDENT_AMBULATORY_CARE_PROVIDER_SITE_OTHER): Payer: Self-pay

## 2013-06-18 ENCOUNTER — Ambulatory Visit (INDEPENDENT_AMBULATORY_CARE_PROVIDER_SITE_OTHER): Payer: Medicare Other | Admitting: Physical Therapy

## 2013-06-18 DIAGNOSIS — E559 Vitamin D deficiency, unspecified: Secondary | ICD-10-CM

## 2013-06-18 DIAGNOSIS — M6281 Muscle weakness (generalized): Secondary | ICD-10-CM

## 2013-06-18 DIAGNOSIS — M949 Disorder of cartilage, unspecified: Secondary | ICD-10-CM

## 2013-06-18 DIAGNOSIS — M899 Disorder of bone, unspecified: Secondary | ICD-10-CM

## 2013-06-18 DIAGNOSIS — R293 Abnormal posture: Secondary | ICD-10-CM

## 2013-06-26 ENCOUNTER — Encounter: Payer: Medicare Other | Admitting: Physical Therapy

## 2013-06-27 ENCOUNTER — Telehealth: Payer: Self-pay | Admitting: Obstetrics & Gynecology

## 2013-06-27 NOTE — Telephone Encounter (Signed)
Pt would like an appointment to see Dr. Hyacinth Meeker. Says it's personal and did not give any other reason.

## 2013-06-27 NOTE — Telephone Encounter (Signed)
LMTCB  aa 

## 2013-06-27 NOTE — Telephone Encounter (Signed)
Spoke with pt who reports she has been having itching around her rectum and up towards the vagina. Pt not SA but is dating someone from [place] who is coming for Thanksgiving. Pt denies odor or discharge. Pt cannot see anything when she looks at area with a mirror. Pt does not want to have this problem during the holidays. Sched OV with SM 06-29-13 at 10:30.

## 2013-06-28 ENCOUNTER — Encounter: Payer: Medicare Other | Admitting: Physical Therapy

## 2013-06-29 ENCOUNTER — Encounter: Payer: Self-pay | Admitting: Obstetrics & Gynecology

## 2013-06-29 ENCOUNTER — Ambulatory Visit (INDEPENDENT_AMBULATORY_CARE_PROVIDER_SITE_OTHER): Payer: MEDICARE | Admitting: Obstetrics & Gynecology

## 2013-06-29 VITALS — BP 120/64 | HR 72 | Ht 61.0 in | Wt 139.0 lb

## 2013-06-29 DIAGNOSIS — L293 Anogenital pruritus, unspecified: Secondary | ICD-10-CM

## 2013-06-29 DIAGNOSIS — L292 Pruritus vulvae: Secondary | ICD-10-CM

## 2013-06-29 NOTE — Progress Notes (Signed)
Subjective:     Patient ID: Marissa Jacobs, female   DOB: 06-Mar-1936, 77 y.o.   MRN: [id number]  Vaginal Itching  77 yo G2P1 DWF here for recurrent vulvar itching issues.  Is dating a man from [place] and hasn't been sexually active for months.  She has been prescribed clobetasol and mycolog.  Both seem to help but don't fix it.  Did have some topical reaction to a mini pad in the summer.  No vaginal bleeding.  No discharge.  No odor.  Long term pt of Dr. Harlene Salts.  Uses dove soap.  Uses angel soft toilet paper.  Uses snuggle fabric softener.    Review of Systems  All other systems reviewed and are negative.       Objective:   Physical Exam  Constitutional: She is oriented to person, place, and time. She appears well-developed and well-nourished.  Genitourinary: Vagina normal.    There is no rash, tenderness, lesion or injury on the right labia. There is lesion (see picture) on the left labia. There is no rash, tenderness or injury on the left labia.  Lymphadenopathy:       Right: No inguinal adenopathy present.       Left: No inguinal adenopathy present.  Neurological: She is alert and oriented to person, place, and time.  Skin: Skin is warm and dry.   Biopsy recommended.  Area right inferior vulvar cleansed with Betadine x 3.  0.5 cc 1% Lidocaine instilled.  3mm punch biopsy obtained.  Silver nitrate used for hemostasis.  Pt tolerated procedure well.  Biopsy labeled and sent to pathology.    Assessment:     Vulvar irritation S/P biopsy today     Plan:     Advised no fabric softner, free and clear detergent.  Also advised to change to Gilead toilet paper.  Biopsy pending and results will be called to pt.  Neosporin ok.

## 2013-06-29 NOTE — Patient Instructions (Signed)

## 2013-07-03 ENCOUNTER — Encounter: Payer: Medicare Other | Admitting: Physical Therapy

## 2013-07-04 ENCOUNTER — Telehealth: Payer: Self-pay | Admitting: Emergency Medicine

## 2013-07-04 NOTE — Telephone Encounter (Signed)
Message left to return call to Ashar Lewinski at [phone].    

## 2013-07-04 NOTE — Telephone Encounter (Signed)
Message copied by Joeseph Amor on Wed Jul 04, 2013  8:49 AM ------      Message from: Jerene Bears      Created: Tue Jul 03, 2013 10:57 PM       Please inform biopsy showed benign findings most consistent with a topical irritant--like the things we talked about (toilet paper, soaps, detergents).  It is ok to use the steroid ointment with symptoms but I want her to keep changing her products until she figures out which one is really causing the problem.  I'd like to have her check in in 3 months for OV just for f/u and to make sure the issue is improved/resolved. ------

## 2013-07-05 NOTE — Telephone Encounter (Signed)
Spoke with patient. Message from Dr. Hyacinth Meeker given. We discussed removing irritants from lifestyle and reinforced message from Dr. Hyacinth Meeker, free and clear detergents, body soaps and toilet paper with no additives. Patient is using neosporin which was okay with Dr. Hyacinth Meeker. She will decrease use as symptoms improve and we discussed using clobetesol for symptoms.   3 month follow up scheduled. Patient is agreeable with plan and will call back if needs further assistance or follow up prn.

## 2013-07-10 NOTE — Telephone Encounter (Signed)
Pt has a couple of questions for Dr. Hyacinth Meeker  No information given

## 2013-07-11 ENCOUNTER — Other Ambulatory Visit: Payer: Self-pay | Admitting: *Deleted

## 2013-07-11 MED ORDER — FAMCICLOVIR 125 MG PO TABS: 125.0000 mg | ORAL_TABLET | Freq: Two times a day (BID) | ORAL | Status: DC

## 2013-07-11 NOTE — Telephone Encounter (Signed)
LMTCB cm 

## 2013-07-11 NOTE — Telephone Encounter (Signed)
Patient is returning a call to Carol.

## 2013-07-11 NOTE — Telephone Encounter (Signed)
Pt asking for Rx for fever blister on lip. Rx for Famvir 125mg  bid  #10 x 1 refill routed to Dr. Hyacinth Meeker for approval.

## 2013-07-11 NOTE — Telephone Encounter (Signed)
Spoke with patient she is concerned that the Biopsy site has not completely healed, her sweet heart is in from out Of town not sure what to do. Using Neosporin on the Biopsy site daily and Cortizone Cream every third day.  Are  there any Precautions? Do's and Don'ts ?    Pt also has a fever blister on her lip can you prescribe a Cream or something.  [facility]  [place].

## 2013-07-11 NOTE — Telephone Encounter (Signed)
Left patient a voice message per her request,  Rx for Famvir sent to Pharmacy, use lubrication with sexual activity.

## 2013-07-11 NOTE — Telephone Encounter (Signed)
Has been on famvir in the past BID for 5 days.  Use lubrication with sexual activity.

## 2013-07-19 ENCOUNTER — Ambulatory Visit
Admission: RE | Admit: 2013-07-19 | Discharge: 2013-07-19 | Disposition: A | Discharge status: Home / Self Care | Payer: Medicare Other | Source: Ambulatory Visit | Attending: Internal Medicine | Admitting: Internal Medicine

## 2013-07-19 DIAGNOSIS — M899 Disorder of bone, unspecified: Secondary | ICD-10-CM

## 2013-07-23 ENCOUNTER — Ambulatory Visit: Payer: Medicare Other

## 2013-08-01 ENCOUNTER — Ambulatory Visit: Payer: Medicare Other

## 2013-08-10 ENCOUNTER — Ambulatory Visit
Admission: RE | Admit: 2013-08-10 | Discharge: 2013-08-10 | Disposition: A | Discharge status: Home / Self Care | Payer: Medicare Other | Source: Ambulatory Visit

## 2013-08-10 DIAGNOSIS — Z1231 Encounter for screening mammogram for malignant neoplasm of breast: Secondary | ICD-10-CM

## 2013-08-22 ENCOUNTER — Ambulatory Visit: Payer: Medicare Other

## 2013-10-04 ENCOUNTER — Telehealth: Payer: Self-pay | Admitting: Obstetrics & Gynecology

## 2013-10-04 NOTE — Telephone Encounter (Signed)
Patient cancel appointment 10/05/13 with Dr. Sabra Heck for 3 month recheck. Patient reports she is "not having the problem anymore but will call back to reschedule if needed."

## 2013-10-05 ENCOUNTER — Ambulatory Visit: Payer: MEDICARE | Admitting: Obstetrics & Gynecology

## 2013-10-29 ENCOUNTER — Ambulatory Visit: Payer: Self-pay | Admitting: Obstetrics and Gynecology

## 2013-10-29 ENCOUNTER — Encounter: Payer: Self-pay | Admitting: Obstetrics & Gynecology

## 2013-10-29 ENCOUNTER — Ambulatory Visit (INDEPENDENT_AMBULATORY_CARE_PROVIDER_SITE_OTHER): Payer: Medicare Other | Admitting: Obstetrics & Gynecology

## 2013-10-29 VITALS — BP 142/78 | HR 64 | Resp 16 | Ht 61.0 in | Wt 139.0 lb

## 2013-10-29 DIAGNOSIS — Z124 Encounter for screening for malignant neoplasm of cervix: Secondary | ICD-10-CM

## 2013-10-29 MED ORDER — LORAZEPAM 0.5 MG PO TABS: 0.5000 mg | ORAL_TABLET | Freq: Every day | ORAL | Status: DC

## 2013-10-29 MED ORDER — ESTRADIOL 0.025 MG/24HR TD PTTW: 1.0000 | MEDICATED_PATCH | TRANSDERMAL | Status: DC

## 2013-10-29 MED ORDER — PROGESTERONE MICRONIZED 100 MG PO CAPS: 100.0000 mg | ORAL_CAPSULE | Freq: Every day | ORAL | Status: DC

## 2013-10-29 NOTE — Patient Instructions (Addendum)

## 2013-10-29 NOTE — Progress Notes (Signed)
78 y.o. [id number] DivorcedCaucasianF here for annual exam.   Significant other is moving here from [place].  Son having a little trouble with this.  He's afraid this will change the relationship they have.  No vaginal bleeding.  No vulvar itching.  Having sex about three times a week.    Sees Dr. Virgina Jock every six months.  Last appt 10/08/13.  Cholesterol is mildly elevated.  BP mildly elevated at last visit.  Patient's last menstrual period was 08/16/1993.          Sexually active: yes  The current method of family planning is none.    Exercising: yes  walking and weight lifting Smoker:  Former smoker in [place] Maintenance: Pap:  10/03/09 WNL History of abnormal Pap:  Yes/CIN I with LEEP 1997 MMG:  08/10/13 3D-normal Colonoscopy:  12/12-repeat in 10 years BMD:   12/14-stable osteopenia TDaP:  With Dr Virgina Jock Screening Labs: PCP, Hb today: PCP, Urine today: PCP   reports that she quit smoking about 50 years ago. She has never used smokeless tobacco. She reports that she drinks about 2.5 ounces of alcohol per week. She reports that she does not use illicit drugs.  Past Medical History  Diagnosis Date  . GERD (gastroesophageal reflux disease)   . Hyperlipidemia   . STD (sexually transmitted disease)     HSV  . Post traumatic stress disorder   . Diverticulosis 2/04  . Anemia     secondary to taking omeprazole-on iron    Past Surgical History  Procedure Laterality Date  . Colposcopy    . Cervical biopsy  w/ loop electrode excision      CIN1  . Facial cosmetic surgery  9/03  . Esophageal dilation  2013  . Appendectomy    . Tonsillectomy    . Knee surgery      Current Outpatient Prescriptions  Medication Sig Dispense Refill  . CALCIUM PO Take 600 mg by mouth 2 (two) times daily.       . Cholecalciferol (VITAMIN D PO) Take 400 Units by mouth daily.       . Coenzyme Q10 (CO Q 10 PO) Take by mouth daily.      Marland Kitchen estradiol (VIVELLE-DOT) 0.025 MG/24HR Place 1 patch onto the  skin 2 (two) times a week.      . hydrochlorothiazide (MICROZIDE) 12.5 MG capsule Take 12.5 mg by mouth 2 (two) times daily.       . IRON PO Take by mouth daily. nuiron      . lisinopril (PRINIVIL,ZESTRIL) 10 MG tablet Take 10 mg by mouth daily.      . Omega-3 Fatty Acids (FISH OIL PO) Take 600 mg by mouth 2 (two) times daily.       . Omeprazole 20 MG TBEC Take 1 tablet by mouth daily.       . progesterone (PROMETRIUM) 100 MG capsule Take 100 mg by mouth daily.      Marland Kitchen tretinoin (RETIN-A) 0.025 % cream       . Ascorbic Acid (VITAMIN C PO) Take by mouth daily.      . clobetasol ointment (TEMOVATE) 0.05 % Apply topically 2 (two) times daily.  60 g  0  . famciclovir (FAMVIR) 125 MG tablet Take 1 tablet (125 mg total) by mouth 2 (two) times daily.  10 tablet  1  . Multiple Vitamins-Minerals (MULTIVITAMIN WITH MINERALS) tablet Take 1 tablet by mouth daily.      Marland Kitchen nystatin-triamcinolone ointment (MYCOLOG) Apply  topically 2 (two) times daily.  30 g  0   No current facility-administered medications for this visit.    Family History  Problem Relation Age of Onset  .       ROS:  Pertinent items are noted in HPI.  Otherwise, a comprehensive ROS was negative.  Exam:   BP 142/78  Pulse 64  Resp 16  Ht 5\' 1"  (1.549 m)  Wt 139 lb (63.05 kg)  BMI 26.28 kg/m2  LMP 08/16/1993  Weight change: +2lbs   Height: 5\' 1"  (154.9 cm)  Ht Readings from Last 3 Encounters:  10/29/13 5\' 1"  (1.549 m)  06/29/13 5\' 1"  (1.549 m)  03/09/13 5\' 1"  (1.549 m)    General appearance: alert, cooperative and appears stated age Head: Normocephalic, without obvious abnormality, atraumatic Neck: no adenopathy, supple, symmetrical, trachea midline and thyroid normal to inspection and palpation Lungs: clear to auscultation bilaterally Breasts: normal appearance, no masses or tenderness Heart: regular rate and rhythm Abdomen: soft, non-tender; bowel sounds normal; no masses,  no organomegaly Extremities: extremities  normal, atraumatic, no cyanosis or edema Skin: Skin color, texture, turgor normal. No rashes or lesions Lymph nodes: Cervical, supraclavicular, and axillary nodes normal. No abnormal inguinal nodes palpated Neurologic: Grossly normal   Pelvic: External genitalia:  no lesions              Urethra:  normal appearing urethra with no masses, tenderness or lesions              Bartholins and Skenes: normal                 Vagina: normal appearing vagina with normal color and discharge, no lesions              Cervix: no lesions              Pap taken: yes Bimanual Exam:  Uterus:  normal size, contour, position, consistency, mobility, non-tender              Adnexa: normal adnexa and no mass, fullness, tenderness               Rectovaginal: Confirms               Anus:  normal sphincter tone, no lesions  A:  Well Woman with normal exam PMP, on low dosed HRT Osteopenia Anxiety H/O HSV Esophageal stricture--has esophageal stretching about once a year with GI in [place]. Insomnia  P:   Mammogram yearly BMD done 12/14.   pap smear done today. Vivelle dot 0.025 mg twice weekly and Prometrium 100mg  daily 1-15 each month.  Rx to patient (for Vivelle dot to pt) and [facility]  D/W pt risks including stroke, MI, and breast cancer.  Pt willing to accept these risks. Ativan 0.5mg  qhs prn insomnia. Doesn't take Famvir unless needs to take it.  No RX needed. return annually or prn  An After Visit Summary was printed and given to the patient.

## 2013-10-31 LAB — IPS PAP SMEAR ONLY

## 2013-11-03 ENCOUNTER — Other Ambulatory Visit: Payer: Self-pay | Admitting: Obstetrics and Gynecology

## 2014-02-14 ENCOUNTER — Other Ambulatory Visit: Payer: Self-pay | Admitting: *Deleted

## 2014-02-14 MED ORDER — NYSTATIN-TRIAMCINOLONE 100000-0.1 UNIT/GM-% EX OINT: TOPICAL_OINTMENT | Freq: Two times a day (BID) | CUTANEOUS | Status: DC

## 2014-02-14 NOTE — Telephone Encounter (Signed)
Fax From: [facility] for Nystatin-Triamcinolone Last Refilled: 02/20/13 #60 grams  Last AEX: 10/29/13 With Dr. Sabra Heck Aex Scheduled:  11/01/14 with Dr. Sabra Heck  Please advise.

## 2014-06-17 ENCOUNTER — Encounter: Payer: Self-pay | Admitting: Obstetrics & Gynecology

## 2014-07-05 ENCOUNTER — Other Ambulatory Visit: Payer: Self-pay

## 2014-07-05 DIAGNOSIS — Z1231 Encounter for screening mammogram for malignant neoplasm of breast: Secondary | ICD-10-CM

## 2014-07-26 ENCOUNTER — Telehealth: Payer: Self-pay | Admitting: Obstetrics & Gynecology

## 2014-07-26 NOTE — Telephone Encounter (Signed)
Patient is asking for name of family counselor Dr.Miller had recommended. Patient says you can leave the name on her voicemail.

## 2014-07-29 NOTE — Telephone Encounter (Signed)
Patient was seen 10/29/13 for AEX, please advise Dr. Sabra Heck.

## 2014-07-31 NOTE — Telephone Encounter (Signed)
Marissa Jacobs at [place] or [facility] on [address]

## 2014-08-01 ENCOUNTER — Other Ambulatory Visit: Payer: Self-pay | Admitting: Obstetrics & Gynecology

## 2014-08-01 NOTE — Telephone Encounter (Signed)
Patient notified and thanks you!

## 2014-08-01 NOTE — Telephone Encounter (Signed)
Medication refill request: Ativan 0.5 mg  Last AEX:  10/29/13 with Dr. Sabra Heck Next AEX: 11/01/14 with Dr. Sabra Heck Last MMG (if hormonal medication request): N/A Refill authorized: #30/1 rfs, please advise.

## 2014-08-02 NOTE — Telephone Encounter (Signed)
RX printed, signed by Dr. Sabra Heck and faxed to [facility].

## 2014-08-12 ENCOUNTER — Ambulatory Visit
Admission: RE | Admit: 2014-08-12 | Discharge: 2014-08-12 | Disposition: A | Discharge status: Home / Self Care | Payer: PRIVATE HEALTH INSURANCE | Source: Ambulatory Visit

## 2014-08-12 DIAGNOSIS — Z1231 Encounter for screening mammogram for malignant neoplasm of breast: Secondary | ICD-10-CM

## 2014-11-01 ENCOUNTER — Ambulatory Visit (INDEPENDENT_AMBULATORY_CARE_PROVIDER_SITE_OTHER): Payer: Medicare Other | Admitting: Obstetrics & Gynecology

## 2014-11-01 ENCOUNTER — Encounter: Payer: Self-pay | Admitting: Obstetrics & Gynecology

## 2014-11-01 VITALS — BP 130/58 | HR 80 | Resp 16 | Ht 61.25 in | Wt 141.4 lb

## 2014-11-01 DIAGNOSIS — Z0000 Encounter for general adult medical examination without abnormal findings: Secondary | ICD-10-CM

## 2014-11-01 DIAGNOSIS — Z01419 Encounter for gynecological examination (general) (routine) without abnormal findings: Secondary | ICD-10-CM | POA: Diagnosis not present

## 2014-11-01 DIAGNOSIS — M8580 Other specified disorders of bone density and structure, unspecified site: Secondary | ICD-10-CM | POA: Diagnosis not present

## 2014-11-01 DIAGNOSIS — K219 Gastro-esophageal reflux disease without esophagitis: Secondary | ICD-10-CM | POA: Insufficient documentation

## 2014-11-01 DIAGNOSIS — R319 Hematuria, unspecified: Secondary | ICD-10-CM

## 2014-11-01 DIAGNOSIS — K222 Esophageal obstruction: Secondary | ICD-10-CM

## 2014-11-01 LAB — POCT URINALYSIS DIPSTICK
Bilirubin, UA: NEGATIVE
Glucose, UA: NEGATIVE
Ketones, UA: NEGATIVE
Leukocytes, UA: NEGATIVE
Nitrite, UA: NEGATIVE
Protein, UA: NEGATIVE
Urobilinogen, UA: NEGATIVE
pH, UA: 5

## 2014-11-01 MED ORDER — LORAZEPAM 0.5 MG PO TABS: 0.5000 mg | ORAL_TABLET | Freq: Every evening | ORAL | Status: DC | PRN

## 2014-11-01 MED ORDER — PROGESTERONE MICRONIZED 100 MG PO CAPS: 100.0000 mg | ORAL_CAPSULE | Freq: Every day | ORAL | Status: DC

## 2014-11-01 MED ORDER — ESTRADIOL 0.025 MG/24HR TD PTTW: 1.0000 | MEDICATED_PATCH | TRANSDERMAL | Status: DC

## 2014-11-01 NOTE — Addendum Note (Signed)
Addended by: Robley Fries on: 11/01/2014 03:20 PM   Modules accepted: Orders, SmartSet

## 2014-11-01 NOTE — Progress Notes (Signed)
79 y.o. [id number] DivorcedCaucasianF here for annual exam.  Doing well.  No vaginal bleeding.  Significant other moved here in August.    PCP:  Dr. Virgina Jock.  Will be seen in August for routine exam.  Usually seen every six months.  Patient's last menstrual period was 08/16/1993.          Sexually active: Yes.    The current method of family planning is post menopausal status.    Exercising: Yes.    Walking, weights 2-3x/wk Smoker:  no  Health Maintenance: Pap:  10/29/13 Negative  History of abnormal Pap:  no MMG:  08/12/14  Colonoscopy:  07/2011 Polyps- f/u in 10 years BMD:   07/19/2013 TDaP:  Not sure Screening Labs:labs done by Shon Baton, MD    reports that she quit smoking about 51 years ago. She has never used smokeless tobacco. She reports that she drinks about 2.5 oz of alcohol per week. She reports that she does not use illicit drugs.  Past Medical History  Diagnosis Date  . GERD (gastroesophageal reflux disease)   . Hyperlipidemia   . STD (sexually transmitted disease)     HSV  . Post traumatic stress disorder   . Diverticulosis 2/04  . Anemia     secondary to taking omeprazole-on iron  . Osteopenia     Past Surgical History  Procedure Laterality Date  . Colposcopy    . Cervical biopsy  w/ loop electrode excision      CIN1  . Facial cosmetic surgery  9/03  . Esophageal dilation  2013  . Appendectomy    . Tonsillectomy    . Knee surgery      Family History  Problem Relation Age of Onset  .       ROS:  Pertinent items are noted in HPI.  Otherwise, a comprehensive ROS was negative.  Exam:    General appearance: alert, cooperative and appears stated age Head: Normocephalic, without obvious abnormality, atraumatic Neck: no adenopathy, supple, symmetrical, trachea midline and thyroid normal to inspection and palpation Lungs: clear to auscultation bilaterally Breasts: normal appearance, no masses or tenderness Heart: regular rate and rhythm Abdomen: soft,  non-tender; bowel sounds normal; no masses,  no organomegaly Extremities: extremities normal, atraumatic, no cyanosis or edema Skin: Skin color, texture, turgor normal. No rashes or lesions Lymph nodes: Cervical, supraclavicular, and axillary nodes normal. No abnormal inguinal nodes palpated Neurologic: Grossly normal   Pelvic: External genitalia:  no lesions              Urethra:  normal appearing urethra with no masses, tenderness or lesions              Bartholins and Skenes: normal                 Vagina: normal appearing vagina with normal color and discharge, no lesions              Cervix: no lesions              Pap taken: No. Bimanual Exam:  Uterus:  normal size, contour, position, consistency, mobility, non-tender              Adnexa: normal adnexa and no mass, fullness, tenderness               Rectovaginal: Confirms               Anus:  normal sphincter tone, no lesions  Chaperone was present for exam.  A:  Well Woman with normal exam PMP, on low dosed HRT Osteopenia Anxiety H/O HSV Esophageal stricture--has esophageal stretching.  Goes about once a year with Dr. Adria Devon at [facility]. Insomnia  P: Mammogram yearly BMD done 12/14. Order placed for BMD.  She will do it this year with her MMG. pap smear done 2015.  No pap today. Vivelle dot 0.025 mg twice weekly and Prometrium 100mg  daily 1-15 each month. Rx to patient (for Vivelle dot to pt) and [facility]. D/W pt risks including stroke, MI, and breast cancer. Pt desires to continue these medications. Ativan 0.5mg  qhs prn insomnia.  #30/1RF. Doesn't take Famvir unless needs to take it. No RX needed. return annually or prn   j

## 2014-11-02 LAB — URINALYSIS, MICROSCOPIC ONLY
Bacteria, UA: NONE SEEN
Casts: NONE SEEN
Crystals: NONE SEEN
Squamous Epithelial / LPF: NONE SEEN

## 2015-01-22 ENCOUNTER — Telehealth: Payer: Self-pay | Admitting: Obstetrics & Gynecology

## 2015-01-22 ENCOUNTER — Other Ambulatory Visit: Payer: Self-pay | Admitting: Obstetrics & Gynecology

## 2015-01-22 NOTE — Telephone Encounter (Signed)
Note not needed 

## 2015-01-22 NOTE — Telephone Encounter (Signed)
Medication refill request: Famciclovir 125 mg  Last AEX:  11/01/14 with SM Next AEX: 01/14/16 with SM Last MMG (if hormonal medication request): n/a Refill authorized: Please advise  (Routed to Dr. Quincy Simmonds since Dr. Sabra Heck is out of the office today)

## 2015-04-15 ENCOUNTER — Other Ambulatory Visit: Payer: Self-pay | Admitting: Obstetrics & Gynecology

## 2015-04-15 MED ORDER — PROGESTERONE MICRONIZED 100 MG PO CAPS: 100.0000 mg | ORAL_CAPSULE | Freq: Every day | ORAL | Status: DC

## 2015-05-12 ENCOUNTER — Ambulatory Visit: Payer: PRIVATE HEALTH INSURANCE | Attending: Internal Medicine | Admitting: Physical Therapy

## 2015-05-12 DIAGNOSIS — R293 Abnormal posture: Secondary | ICD-10-CM | POA: Diagnosis present

## 2015-05-12 DIAGNOSIS — R29898 Other symptoms and signs involving the musculoskeletal system: Secondary | ICD-10-CM | POA: Diagnosis present

## 2015-05-12 DIAGNOSIS — M436 Torticollis: Secondary | ICD-10-CM | POA: Insufficient documentation

## 2015-05-12 NOTE — Patient Instructions (Signed)
Posture Tips DO: - stand tall and erect - keep chin tucked in - keep head and shoulders in alignment - check posture regularly in mirror or large window - pull head back against headrest in car seat;  Change your position often.  Sit with lumbar support. DON'T: - slouch or slump while watching TV or reading - sit, stand or lie in one position  for too long;  Sitting is especially hard on the spine so if you sit at a desk/use the computer, then stand up often!   Copyright  VHI. All rights reserved.  Posture - Standing   Good posture is important. Avoid slouching and forward head thrust. Maintain curve in low back and align ears over shoul- ders, hips over ankles.  Pull your belly button in toward your back bone.   Copyright  VHI. All rights reserved.  Posture - Sitting   Sit upright, head facing forward. Try using a roll to support lower back. Keep shoulders relaxed, and avoid rounded back. Keep hips level with knees. Avoid crossing legs for long periods.   Copyright  VHI. All rights reserved.  Flexibility: Corner Stretch   Standing in corner with hands just above shoulder level and feet _10-12___ inches from corner, lean forward until a comfortable stretch is felt across chest. Hold __30__ seconds. Repeat ___2-3_ times per set. Do _1-3__ sets per session. Do _2___ sessions per day.  http://orth.exer.us/343   Copyright  VHI. All rights reserved.    USE THE WALL TO CHECK IN WITH POSTURE  CHIN RETRACTIONHead Press With Chin Tuck   Tuck chin SLIGHTLY toward chest, keep mouth closed. Feel weight on back of head. Increase weight by pressing head down. Hold __10_ seconds. Relax. Repeat _3-5__ times. Surface: floor   Copyright  VHI. All rights reserved.

## 2015-05-12 NOTE — Therapy (Signed)
[place], [place], [address] Phone: [phone]   Fax:  [phone]  Physical Therapy Evaluation  Patient Details  Name: Marissa Jacobs MRN: [id number] Date of Birth: Oct 05, 1935 Referring Provider:  Shon Baton, MD  Encounter Date: 05/12/2015      PT End of Session - 05/12/15 1319    Visit Number 1   Number of Visits 8   Date for PT Re-Evaluation 06/09/15   PT Start Time 1202   PT Stop Time 1245   PT Time Calculation (min) 43 min   Activity Tolerance Patient tolerated treatment well   Behavior During Therapy --  needs redirection, asked multitude of questions    Arrived 15 min late, she went to the wrong facility.     Past Medical History  Diagnosis Date  . GERD (gastroesophageal reflux disease)   . Hyperlipidemia   . STD (sexually transmitted disease)     HSV  . Post traumatic stress disorder   . Diverticulosis 2/04  . Anemia     secondary to taking omeprazole-on iron  . Osteopenia     Past Surgical History  Procedure Laterality Date  . Colposcopy    . Cervical biopsy  w/ loop electrode excision      CIN1  . Facial cosmetic surgery  9/03  . Esophageal dilation  2013  . Appendectomy    . Tonsillectomy    . Knee surgery      There were no vitals filed for this visit.  Visit Diagnosis:  Abnormal posture  Upper extremity weakness  Stiffness of cervical spine      Subjective Assessment - 05/12/15 1207    Subjective Woke 6 weeks ago with stiffness, pain in neck. This is now resolved.  She is here for PT to improve her posture and prevent further decline of spine.  She has occ stiffness in neck.  Legs are occ weak when she sits in one position for awhile. She is a regular exerciser and would like to be sure her routine is safe and  effective.    Pertinent History Sees Chiro, osteopenia, GERD   Limitations Sitting   How long can you sit comfortably? not limited   How long can you  stand comfortably? not limited   How long can you walk comfortably? not limited   Diagnostic tests XR arthritis, scoliosis   Patient Stated Goals prevent my head from drifting further fw   Currently in Pain? No/denies   Pain Score --  Was severe when initially began   Pain Location Neck   Pain Orientation Posterior   Pain Descriptors / Indicators Tightness   Pain Type Chronic pain   Pain Onset More than a month ago   Pain Frequency Occasional   Aggravating Factors  stretches, chiro   Pain Relieving Factors heat, ibuprofen    Multiple Pain Sites No            [facility] PT Assessment - 05/12/15 1216    Assessment   Medical Diagnosis Cervical spondylosis, scoliosis, kyphosis   Onset Date/Surgical Date --  6 weeks ago   Next MD Visit unknown   Prior Therapy yes   Precautions   Precautions --  osteopenia   Restrictions   Weight Bearing Restrictions No   Balance Screen   Has the patient fallen in the past 6 months No   Prior Function   Level of Independence Independent   Vocation Full time employment   Vocation Requirements  real estate agent   Cognition   Overall Cognitive Status Within Functional Limits for tasks assessed   Observation/Other Assessments   Focus on Therapeutic Outcomes (FOTO)  NT   Sensation   Light Touch Not tested   Coordination   Gross Motor Movements are Fluid and Coordinated Not tested   Posture/Postural Control   Posture/Postural Control Postural limitations   Postural Limitations Forward head;Increased thoracic kyphosis;Left pelvic obliquity   Posture Comments high Rt. shoulder, high L hip,    AROM   Cervical Flexion 50   Cervical Extension 40   Cervical - Right Side Bend 27   Cervical - Left Side Bend 26   Cervical - Right Rotation 50   Cervical - Left Rotation 55   Lumbar Flexion WNL   Lumbar Extension 25%   Lumbar - Right Side Bend WNL   Lumbar - Left Side Bend WNL   Lumbar - Right Rotation WFL   Lumbar - Left Rotation [facility]   Strength    Right/Left Shoulder --  4/5 shoulder flex and abd, rhomboids 3+/5    Right Shoulder Flexion 4/5   Right Shoulder Extension 4/5   Left Shoulder Flexion 4-/5   Left Shoulder Extension 4/5   Right/Left Hip --  WNL   Right/Left Knee --  [facility]   Palpation   Palpation comment min pain Rt. upper trap and levator           [facility] Adult PT Treatment/Exercise - 05/12/15 1216    Self-Care   Self-Care Posture   Posture used wall for alignment, mirror for feedbackj regarding chin tuck           PT Education - 05/12/15 1318    Education provided Yes   Education Details PT/POC, osteopenia, posture, spinal curves and prevention of progression of sx , pt with alot of questions regarding products, chiropractor and exercises   Person(s) Educated Patient   Methods Explanation;Demonstration;Handout   Comprehension Verbalized understanding;Returned demonstration             PT Long Term Goals - 05/12/15 1542    PT LONG TERM GOAL #1   Title Pt will be I with HEP for posture/core/UE  strength   Time 5   Period Weeks   Status New   PT LONG TERM GOAL #2   Title Pt will be able to correct posture in standing and sitting without cues.    Time 5   Period Weeks   Status New   PT LONG TERM GOAL #3   Title Pt will increase UE strength in rhomboids/lower trap to 4/5 or more to improve posture   Baseline 3+/5   Time 5   Status New   PT LONG TERM GOAL #4   Title Pt will understand RICE, concepts of muscle balance and use safe body mechanics related to Osteopenia   Time 5   Period Weeks   Status New               Plan - 05/12/15 1539    Clinical Impression Statement Patient presents with postural imbalance, abnormal spinal alignment and min UE weakness.  Her neck pain has resolved, however she seeks guidance on proper HEP for prevention of progression of postural changes.  She was advised to stop going to the chiropractor while in PT and was open to that.    Pt will benefit from  skilled therapeutic intervention in order to improve on the following deficits Decreased strength;Postural dysfunction;Impaired flexibility;Decreased range of motion;Hypomobility;Pain;Increased  fascial restricitons   Rehab Potential Good   PT Frequency 2x / week   PT Duration 4 weeks   PT Treatment/Interventions Manual techniques;Cryotherapy;Therapeutic activities;Therapeutic exercise;Electrical Stimulation;Neuromuscular re-education;Moist Heat;Ultrasound;Patient/family education;Taping;Dry needling;Other (comment);Functional mobility training  Pilates based PT   PT Next Visit Plan pt to bring in her HEP/routine for guidance   PT Home Exercise Plan gave corner stretch and cervical retractions   Consulted and Agree with Plan of Care Patient          G-Codes - May 22, 2015 1546    Functional Assessment Tool Used clinical judgement   Functional Limitation Changing and maintaining body position   Changing and Maintaining Body Position Current Status (V9563) At least 20 percent but less than 40 percent impaired, limited or restricted   Changing and Maintaining Body Position Goal Status (O7564) At least 1 percent but less than 20 percent impaired, limited or restricted       Problem List Patient Active Problem List   Diagnosis Date Noted  . Acid reflux 11/01/2014  . Hyperlipidemia 07/28/2012  . OBESITY, UNSPECIFIED 09/28/2007    Marleny Faller May 22, 2015, 3:49 PM  [place] [facility] [address] [place], [place], [address] Phone: [phone]   Fax:  [phone]  Raeford Razor, PT May 22, 2015 3:49 PM Phone: [phone] Fax: [phone]

## 2015-05-13 ENCOUNTER — Ambulatory Visit: Payer: PRIVATE HEALTH INSURANCE

## 2015-06-02 ENCOUNTER — Ambulatory Visit: Payer: Medicare Other | Attending: Internal Medicine | Admitting: Physical Therapy

## 2015-06-02 DIAGNOSIS — R293 Abnormal posture: Secondary | ICD-10-CM | POA: Diagnosis present

## 2015-06-02 DIAGNOSIS — M436 Torticollis: Secondary | ICD-10-CM | POA: Insufficient documentation

## 2015-06-02 DIAGNOSIS — R29898 Other symptoms and signs involving the musculoskeletal system: Secondary | ICD-10-CM | POA: Diagnosis present

## 2015-06-02 NOTE — Therapy (Signed)
[place], [place], [address] Phone: [phone]   Fax:  [phone]  Physical Therapy Treatment  Patient Details  Name: LONIE RUMMELL MRN: [id number] Date of Birth: 1936-07-18 No Data Recorded  Encounter Date: 06/02/2015      PT End of Session - 06/02/15 1517    Visit Number 2   Number of Visits 8   Date for PT Re-Evaluation 06/09/15   PT Start Time 3664   PT Stop Time 1507   PT Time Calculation (min) 33 min   Activity Tolerance Patient tolerated treatment well   Behavior During Therapy [facility] for tasks assessed/performed      Past Medical History  Diagnosis Date  . GERD (gastroesophageal reflux disease)   . Hyperlipidemia   . STD (sexually transmitted disease)     HSV  . Post traumatic stress disorder   . Diverticulosis 2/04  . Anemia     secondary to taking omeprazole-on iron  . Osteopenia     Past Surgical History  Procedure Laterality Date  . Colposcopy    . Cervical biopsy  w/ loop electrode excision      CIN1  . Facial cosmetic surgery  9/03  . Esophageal dilation  2013  . Appendectomy    . Tonsillectomy    . Knee surgery      There were no vitals filed for this visit.  Visit Diagnosis:  Abnormal posture  Upper extremity weakness  Stiffness of cervical spine      Subjective Assessment - 06/02/15 1442    Subjective No complaints of pain. Patient has been doing her chin tucks against the wall.    Currently in Pain? No/denies                         [facility] Adult PT Treatment/Exercise - 06/02/15 0001    Lumbar Exercises: Supine   Other Supine Lumbar Exercises foam roller: arms alternating, horiz abd/add and circles    Other Supine Lumbar Exercises marching and clam x 10 each    Lumbar Exercises: Prone   Single Arm Raise 5 reps  poor head neck and shoulder alignment    Straight Leg Raise 10 reps   Other Prone Lumbar Exercises prone knee bend  x10 each     Other Prone Lumbar Exercises Transverse Abdominus        Self care: chin tuck against wall, foam roller benefits, posture and technique for new core stab ex., spine extension for posture work.          PT Education - 06/02/15 1516    Education provided Yes   Education Details HEP and technique   Person(s) Educated Patient   Methods Explanation;Demonstration;Tactile cues;Verbal cues;Handout   Comprehension Verbalized understanding;Need further instruction;Tactile cues required;Verbal cues required             PT Long Term Goals - 06/02/15 1519    PT LONG TERM GOAL #1   Title Pt will be I with HEP for posture/core/UE  strength   Status On-going   PT LONG TERM GOAL #2   Title Pt will be able to correct posture in standing and sitting without cues.    Status On-going   PT LONG TERM GOAL #3   Title Pt will increase UE strength in rhomboids/lower trap to 4/5 or more to improve posture   Status On-going   PT LONG TERM GOAL #4   Title Pt will understand RICE,  concepts of muscle balance and use safe body mechanics related to Osteopenia   Status On-going               Plan - 06/02/15 1517    Clinical Impression Statement Worked on principles of stabilization and working against gravity for posture. Pt has foam roller at home and was encouraged to try HEP on the roller when ready.  Mod to max cues for technique today.    PT Next Visit Plan pt to bring in her HEP/routine for guidance   PT Home Exercise Plan gave corner stretch and cervical retractions, prone swim prep and Tr A progression   Consulted and Agree with Plan of Care Patient        Problem List Patient Active Problem List   Diagnosis Date Noted  . Acid reflux 11/01/2014  . Hyperlipidemia 07/28/2012  . OBESITY, UNSPECIFIED 09/28/2007    Varie Machamer 06/02/2015, 3:20 PM  [place], [place], [address] Phone: [phone]    Fax:  [phone]  Name: ISSA LUSTER MRN: [id number] Date of Birth: 1936-06-14  Raeford Razor, PT 06/02/2015 3:21 PM Phone: [phone] Fax: [phone]

## 2015-06-02 NOTE — Patient Instructions (Signed)
Given prone UE lift and LE lift, as wellas Transverse Abdominus activation, clam and march.  Patient with many questions in limited amt of time.

## 2015-06-05 ENCOUNTER — Encounter: Payer: PRIVATE HEALTH INSURANCE | Admitting: Physical Therapy

## 2015-06-09 ENCOUNTER — Encounter: Payer: PRIVATE HEALTH INSURANCE | Admitting: Physical Therapy

## 2015-06-12 ENCOUNTER — Encounter: Payer: PRIVATE HEALTH INSURANCE | Admitting: Physical Therapy

## 2015-06-13 ENCOUNTER — Encounter: Payer: PRIVATE HEALTH INSURANCE | Admitting: Physical Therapy

## 2015-06-16 ENCOUNTER — Ambulatory Visit: Payer: Medicare Other | Admitting: Physical Therapy

## 2015-06-16 DIAGNOSIS — R293 Abnormal posture: Secondary | ICD-10-CM | POA: Diagnosis not present

## 2015-06-16 DIAGNOSIS — R29898 Other symptoms and signs involving the musculoskeletal system: Secondary | ICD-10-CM

## 2015-06-16 DIAGNOSIS — M436 Torticollis: Secondary | ICD-10-CM

## 2015-06-16 NOTE — Therapy (Signed)
[place], [place], [address] Phone: [phone]   Fax:  [phone]  Physical Therapy Treatment  Patient Details  Name: Marissa Jacobs MRN: [id number] Date of Birth: 01/05/1936 No Data Recorded  Encounter Date: 06/16/2015      PT End of Session - 06/16/15 1448    Visit Number 3   Number of Visits 8   Date for PT Re-Evaluation 06/09/15   PT Start Time 1425  pt 10 min late   PT Stop Time 1518   PT Time Calculation (min) 53 min   Activity Tolerance Patient tolerated treatment well   Behavior During Therapy [facility] for tasks assessed/performed      Past Medical History  Diagnosis Date  . GERD (gastroesophageal reflux disease)   . Hyperlipidemia   . STD (sexually transmitted disease)     HSV  . Post traumatic stress disorder   . Diverticulosis 2/04  . Anemia     secondary to taking omeprazole-on iron  . Osteopenia     Past Surgical History  Procedure Laterality Date  . Colposcopy    . Cervical biopsy  w/ loop electrode excision      CIN1  . Facial cosmetic surgery  9/03  . Esophageal dilation  2013  . Appendectomy    . Tonsillectomy    . Knee surgery      There were no vitals filed for this visit.  Visit Diagnosis:  Abnormal posture  Upper extremity weakness  Stiffness of cervical spine      Subjective Assessment - 06/16/15 1427    Subjective No pain, patient brought in folder full of exercises.     Currently in Pain? No/denies      PT demonstrated her HEP: prone alternating arms, "superman", verbally reviewed other exercises included corner stretch, foam roller and plank.    Pilates Reformer used for LE/core strength, postural strength, lumbopelvic disassociation and core control.  Exercises included:  Footwork 2 Red and 1 Blue parallel, neutral/ER.   Supine Arm work 1 Red 1 Yellow, Arcs and V  Feet in Straps 1 red 1 Yellow Arcs and circles  Seated Arms on long box: Rows,  shoulder extension ( 1Red) and hinge back with biceps (blue x 10) Patient needs max cues for breathing, alignment and maintaining pelvic stability.           PT Education - 06/16/15 1441    Education provided Yes   Education Details Forensic scientist) Educated Patient   Methods Demonstration;Explanation   Comprehension Verbalized understanding;Need further instruction;Verbal cues required;Tactile cues required             PT Long Term Goals - 06/16/15 1521    PT LONG TERM GOAL #1   Title Pt will be I with HEP for posture/core/UE  strength   Status Achieved   PT LONG TERM GOAL #2   Title Pt will be able to correct posture in standing and sitting without cues.    Status Achieved   PT LONG TERM GOAL #3   Title Pt will increase UE strength in rhomboids/lower trap to 4/5 or more to improve posture   Status Partially Met   PT LONG TERM GOAL #4   Title Pt will understand RICE, concepts of muscle balance and use safe body mechanics related to Osteopenia   Status Achieved               Plan - 06/16/15 1520  Clinical Impression Statement PAtient is I with all exercises, has had a good home program and just needed direction.  She is pain free, wants to have better posture but now understands it is more about preventing further decline.  She will continue with community based Pilates instruction.    PT Next Visit Plan NA, DC   PT Home Exercise Plan gave corner stretch and cervical retractions, prone swim prep and Tr A progression   Consulted and Agree with Plan of Care Patient          G-Codes - 2015-06-20 1521    Functional Assessment Tool Used clinical judgement   Functional Limitation Changing and maintaining body position   Changing and Maintaining Body Position Current Status (H6154) At least 1 percent but less than 20 percent impaired, limited or restricted   Changing and Maintaining Body Position Goal Status (S8457) At least 1 percent but less than 20  percent impaired, limited or restricted   Changing and Maintaining Body Position Discharge Status (N3448) At least 1 percent but less than 20 percent impaired, limited or restricted      Problem List Patient Active Problem List   Diagnosis Date Noted  . Acid reflux 11/01/2014  . Hyperlipidemia 07/28/2012  . OBESITY, UNSPECIFIED 09/28/2007    Marissa Jacobs 20-Jun-2015, 3:30 PM  [facility] [address] [place], [place], [address] Phone: [phone]   Fax:  [phone]  Name: Marissa Jacobs MRN: [id number] Date of Birth: 1935/09/03  PHYSICAL THERAPY DISCHARGE SUMMARY  Visits from Start of Care: 3  Current functional level related to goals / functional outcomes: Goals met   Remaining deficits: Posture, neck and scapular stability   Education / Equipment: HEP, posture and pilates Plan: Patient agrees to discharge.  Patient goals were met. Patient is being discharged due to the patient's request.  ?????    No need for skilled PT at this time.  Raeford Razor, PT 06-20-15 3:31 PM Phone: [phone] Fax: [phone]

## 2015-06-19 ENCOUNTER — Encounter: Payer: PRIVATE HEALTH INSURANCE | Admitting: Physical Therapy

## 2015-06-23 ENCOUNTER — Encounter: Payer: PRIVATE HEALTH INSURANCE | Admitting: Physical Therapy

## 2015-06-26 ENCOUNTER — Encounter: Payer: PRIVATE HEALTH INSURANCE | Admitting: Physical Therapy

## 2015-07-09 ENCOUNTER — Other Ambulatory Visit: Payer: Self-pay

## 2015-07-09 DIAGNOSIS — Z1231 Encounter for screening mammogram for malignant neoplasm of breast: Secondary | ICD-10-CM

## 2015-08-14 ENCOUNTER — Other Ambulatory Visit: Payer: Self-pay | Admitting: Neurology

## 2015-08-14 ENCOUNTER — Ambulatory Visit
Admission: RE | Admit: 2015-08-14 | Discharge: 2015-08-14 | Disposition: A | Discharge status: Home / Self Care | Payer: Medicare Other | Source: Ambulatory Visit

## 2015-08-14 DIAGNOSIS — Z1231 Encounter for screening mammogram for malignant neoplasm of breast: Secondary | ICD-10-CM

## 2015-08-14 NOTE — Telephone Encounter (Signed)
Denied refill for Epi pen. Patient needs OV. Last OV 09/12/12

## 2015-09-22 ENCOUNTER — Telehealth: Payer: Self-pay | Admitting: Obstetrics & Gynecology

## 2015-09-22 NOTE — Telephone Encounter (Signed)
Dr.Miller, may I send in rx for Vivelle Dot 0.25 mg #8 0RF to [facility] on [address]? Unfortunately we are unable to know if her insurance will cover this. If PA is needed our office will be notified and I would be happy to complete this for the patient.

## 2015-09-22 NOTE — Telephone Encounter (Signed)
It is fine to call into the [facility].  You know the PA will be declines for brand only but ok to try.  Thanks.

## 2015-09-22 NOTE — Telephone Encounter (Signed)
Patient requesting refill of Vivelle Dot 0.25 mg (30 day supply) sent to [facility] on [address].. Patient typically get this rx from [place] but she has not received it just yet. Patient has been off of this for more than 10 days. Patient says we may need to authorize this rx because she gets brand name only. She said if we need to call Aurea Graff to Authorize it their number is [phone] and to press option 1. Patient asked if we could get this done today, I told her that refill requests typically take 48 hours to complete, but that we would do the best we could. Best # to reach: [phone]

## 2015-09-23 MED ORDER — VIVELLE-DOT 0.025 MG/24HR TD PTTW: 1.0000 | MEDICATED_PATCH | TRANSDERMAL | Status: DC

## 2015-09-23 NOTE — Telephone Encounter (Signed)
Left message to call [place] at [phone].  Does patient need rx sent to [facility] off [address] at [address] or [address] at [place]?

## 2015-09-23 NOTE — Telephone Encounter (Signed)
Spoke with patient. She would like to have her rx sent to [facility] off [address] at [address] [employer] [address]. Rx for Vivelle Dot 0.025 mg #8 0RF sent to pharmacy of choice. She is agreeable. States she has been off of her patch for 2 weeks and would like to know if it is okay for her to restart. Advised it is okay for her to place a new patch at this time. Reports that she takes Prometrium 100 mg days 1-15 of the month. She has not been taking this medication since she has not had a patch on. Requesting recommendations on how to take her Prometrium for this month. Advised I will speak with Dr.Miller and return call with further recommendations. She is agreeable.

## 2015-09-23 NOTE — Telephone Encounter (Signed)
Left message to call Puneet Selden at [phone]. 

## 2015-09-23 NOTE — Telephone Encounter (Signed)
Spoke with patient. Advised of message as seen below from Dr.Miller. She is agreeable and verbalizes understanding.  Routing to provider for final review. Patient agreeable to disposition. Will close encounter.  

## 2015-09-23 NOTE — Telephone Encounter (Signed)
Patient returning call.

## 2015-09-23 NOTE — Telephone Encounter (Signed)
She can just restart the prometrium now and then take it again at the beginning of March.  It is not a problem to take it closer together this month.

## 2015-09-23 NOTE — Telephone Encounter (Signed)
Pt sent me a letter that I will put on your desk.  We did a PA last year and it was approved.  Will you do this again?  Thanks.

## 2015-09-24 NOTE — Telephone Encounter (Signed)
PA for patient's Prometrium 100 mg has been submitted via covermymeds. I will await response regarding coverage approval or denial.

## 2015-10-09 ENCOUNTER — Encounter: Payer: Self-pay | Admitting: *Deleted

## 2015-10-15 ENCOUNTER — Ambulatory Visit (INDEPENDENT_AMBULATORY_CARE_PROVIDER_SITE_OTHER): Payer: Medicare Other | Admitting: *Deleted

## 2015-10-15 ENCOUNTER — Encounter: Payer: Self-pay | Admitting: Vascular Surgery

## 2015-10-15 DIAGNOSIS — I781 Nevus, non-neoplastic: Secondary | ICD-10-CM

## 2015-10-15 NOTE — Progress Notes (Signed)
X=.3% Sotradecol administered with a 27g butterfly.  Patient received a total of 5cc.  Treated all spider veins I saw. Tol well. Easy access. Anticipate good results. Follow prn. She was happy with her last tx in 2009.  Photos: No.  Compression stockings applied: Yes.

## 2015-10-16 ENCOUNTER — Telehealth: Payer: Self-pay

## 2015-10-16 NOTE — Telephone Encounter (Signed)
Notified patient that her PA for Vivelle Dot has been approved from 08/15/2015-08/15/2016. She is agreeable and states that she got notification regarding her Vivelle Dot and her Prometrium PA approvals. She is very grateful. States she has written a letter to [place] thanking her and will drop this by the office. Patient is asking if there are any openings for an annual exam in March. Her last aex was performed 11/01/2014. States she is currently scheduled for her aex in May. Appointment for her aex has been moved to 11/11/2015 at 3 pm with Dr.Miller. She is agreeable to date and time.  Routing to provider for final review. Patient agreeable to disposition. Will close encounter.

## 2015-10-24 ENCOUNTER — Telehealth: Payer: Self-pay | Admitting: Vascular Surgery

## 2015-10-24 NOTE — Telephone Encounter (Signed)
Pt. had a balance of $300 on 10-15-15 for sclero.  I charged her credit card $500 and did not realize my error until the end of the day.  We voided the $500.  Pt. is to stop back so we could run her card for $300.

## 2015-10-27 ENCOUNTER — Other Ambulatory Visit: Payer: Self-pay | Admitting: Internal Medicine

## 2015-10-27 DIAGNOSIS — I10 Essential (primary) hypertension: Secondary | ICD-10-CM

## 2015-11-05 ENCOUNTER — Other Ambulatory Visit: Payer: Medicare Other

## 2015-11-10 ENCOUNTER — Ambulatory Visit
Admission: RE | Admit: 2015-11-10 | Discharge: 2015-11-10 | Disposition: A | Discharge status: Home / Self Care | Payer: Medicare Other | Source: Ambulatory Visit | Attending: Internal Medicine | Admitting: Internal Medicine

## 2015-11-10 DIAGNOSIS — I10 Essential (primary) hypertension: Secondary | ICD-10-CM

## 2015-11-11 ENCOUNTER — Encounter: Payer: Self-pay | Admitting: Obstetrics & Gynecology

## 2015-11-11 ENCOUNTER — Ambulatory Visit (INDEPENDENT_AMBULATORY_CARE_PROVIDER_SITE_OTHER): Payer: Medicare Other | Admitting: Obstetrics & Gynecology

## 2015-11-11 VITALS — BP 110/56 | HR 98 | Resp 16 | Ht 61.0 in | Wt 144.0 lb

## 2015-11-11 DIAGNOSIS — Z01419 Encounter for gynecological examination (general) (routine) without abnormal findings: Secondary | ICD-10-CM

## 2015-11-11 DIAGNOSIS — Z124 Encounter for screening for malignant neoplasm of cervix: Secondary | ICD-10-CM | POA: Diagnosis not present

## 2015-11-11 MED ORDER — PROGESTERONE MICRONIZED 100 MG PO CAPS: 100.0000 mg | ORAL_CAPSULE | Freq: Every day | ORAL | Status: DC

## 2015-11-11 MED ORDER — ESTRADIOL 0.025 MG/24HR TD PTTW: 1.0000 | MEDICATED_PATCH | TRANSDERMAL | Status: DC

## 2015-11-11 NOTE — Progress Notes (Signed)
80 y.o. [id number] DivorcedCaucasianF here for annual exam.  Doing well.  On HRT and absolutely does NOT want to stop.  Still dating same person.  He moved here and they are living in her home.  Denies vaginal bleeding.  PCP:  Dr. Virgina Jock.  Lab work and vaccines are done with him.  She forgot to bring lab work with her today.  Patient's last menstrual period was 08/16/1993.          Sexually active: Yes.    The current method of family planning is post menopausal status.    Exercising: Yes.    pilates, light weight Smoker:  no  Health Maintenance: Pap:  10/29/13 Neg History of abnormal Pap:  no MMG:  08/15/15 BIRADS1:Neg Colonoscopy:  12/25/2010 f/u 10 years  BMD:   07/19/13 osteopenia  TDaP:  UTD w/ PCP Screening Labs: PCP, Urine today: PCP   reports that she quit smoking about 52 years ago. She has never used smokeless tobacco. She reports that she drinks about 3.0 oz of alcohol per week. She reports that she does not use illicit drugs.  Past Medical History  Diagnosis Date  . GERD (gastroesophageal reflux disease)   . Hyperlipidemia   . STD (sexually transmitted disease)     HSV  . Post traumatic stress disorder   . Diverticulosis 2/04  . Anemia     secondary to taking omeprazole-on iron  . Osteopenia     Past Surgical History  Procedure Laterality Date  . Colposcopy    . Cervical biopsy  w/ loop electrode excision      CIN1  . Facial cosmetic surgery  9/03  . Esophageal dilation  2013  . Appendectomy    . Tonsillectomy    . Knee surgery      Current Outpatient Prescriptions  Medication Sig Dispense Refill  . CALCIUM PO Take 600 mg by mouth 2 (two) times daily.     . Cholecalciferol (VITAMIN D PO) Take 2,500 mg by mouth daily.     . Coenzyme Q10 (CO Q 10 PO) Take by mouth daily.    Marland Kitchen estradiol (VIVELLE-DOT) 0.025 MG/24HR Place 1 patch onto the skin 2 (two) times a week. 24 patch 4  . hydrochlorothiazide (MICROZIDE) 12.5 MG capsule Take 12.5 mg by mouth 2 (two) times  daily.     Marland Kitchen lisinopril (PRINIVIL,ZESTRIL) 10 MG tablet Take 10 mg by mouth daily.    . Magnesium 200 MG TABS Take by mouth at bedtime.    . Melatonin 3 MG CAPS Place under the tongue at bedtime.    . Omega-3 Fatty Acids (FISH OIL PO) Take 600 mg by mouth 2 (two) times daily.     . Omeprazole 20 MG TBEC Take 1 tablet by mouth daily.     . progesterone (PROMETRIUM) 100 MG capsule Take 1 capsule (100 mg total) by mouth daily. (Patient taking differently: Take 100 mg by mouth daily. First 15 days) 30 capsule 13  . tretinoin (RETIN-A) 0.025 % cream     . clobetasol ointment (TEMOVATE) 0.05 % Apply topically 2 (two) times daily. (Patient not taking: Reported on 11/01/2014) 60 g 0  . Multiple Vitamins-Minerals (MULTIVITAMIN WITH MINERALS) tablet Take 1 tablet by mouth daily. Reported on 11/11/2015     No current facility-administered medications for this visit.    Family History  Problem Relation Age of Onset  .       ROS:  Pertinent items are noted in HPI.  Otherwise, a comprehensive  ROS was negative.  Exam:   BP 110/56 mmHg  Pulse 98  Resp 16  Ht '5\' 1"'$  (1.549 m)  Wt 144 lb (65.318 kg)  BMI 27.22 kg/m2  LMP 08/16/1993  Weight change: +5#  Height: '5\' 1"'$  (154.9 cm)  Ht Readings from Last 3 Encounters:  11/11/15 '5\' 1"'$  (1.549 m)  11/01/14 5' 1.25" (1.556 m)  10/29/13 '5\' 1"'$  (1.549 m)    General appearance: alert, cooperative and appears stated age Head: Normocephalic, without obvious abnormality, atraumatic Neck: no adenopathy, supple, symmetrical, trachea midline and thyroid normal to inspection and palpation Lungs: clear to auscultation bilaterally Breasts: normal appearance, no masses or tenderness Heart: regular rate and rhythm Abdomen: soft, non-tender; bowel sounds normal; no masses,  no organomegaly Extremities: extremities normal, atraumatic, no cyanosis or edema Skin: Skin color, texture, turgor normal. No rashes or lesions Lymph nodes: Cervical, supraclavicular, and axillary  nodes normal. No abnormal inguinal nodes palpated Neurologic: Grossly normal   Pelvic: External genitalia:  no lesions              Urethra:  normal appearing urethra with no masses, tenderness or lesions              Bartholins and Skenes: normal                 Vagina: normal appearing vagina with normal color and discharge, no lesions              Cervix: no lesions              Pap taken: Yes.   Bimanual Exam:  Uterus:  normal size, contour, position, consistency, mobility, non-tender              Adnexa: normal adnexa and no mass, fullness, tenderness               Rectovaginal: Confirms               Anus:  normal sphincter tone, no lesions  Chaperone was present for exam.  A:  Well Woman with normal exam PMP, on low dosed HRT Osteopenia Anxiety H/O HSV, no recent outbreaks Esophageal stricture--has esophageal stretching. Is seen at [facility] by Dr. Adria Devon Insomnia  P: Mammogram yearly BMD done 12/14. Order placed for BMD. She will do it this year with her MMG. pap smear done 2015.  Pap obtained today. Vivelle dot 0.025 mg twice weekly and Prometrium '100mg'$  daily 1-15 each month. Rx to patient (for Vivelle dot to pt) and Prom[facility] to [facility]. D/W pt risks including stroke, MI, and breast cancer. Pt desires to continue these medications and is aware of risks including DVT, PE, stroke, MI Doesn't take Famvir unless needs to take it. No RX needed. return annually or prn

## 2015-11-12 LAB — IPS PAP SMEAR ONLY

## 2016-01-13 ENCOUNTER — Ambulatory Visit: Payer: Medicare Other | Admitting: Obstetrics & Gynecology

## 2016-02-04 ENCOUNTER — Other Ambulatory Visit: Payer: Self-pay | Admitting: Internal Medicine

## 2016-02-04 DIAGNOSIS — M47892 Other spondylosis, cervical region: Secondary | ICD-10-CM

## 2016-02-18 ENCOUNTER — Ambulatory Visit
Admission: RE | Admit: 2016-02-18 | Discharge: 2016-02-18 | Disposition: A | Discharge status: Home / Self Care | Payer: Medicare Other | Source: Ambulatory Visit | Attending: Internal Medicine | Admitting: Internal Medicine

## 2016-02-18 DIAGNOSIS — M47892 Other spondylosis, cervical region: Secondary | ICD-10-CM

## 2016-03-31 ENCOUNTER — Encounter (HOSPITAL_COMMUNITY): Payer: Medicare Other

## 2016-04-09 ENCOUNTER — Telehealth: Payer: Self-pay | Admitting: Obstetrics & Gynecology

## 2016-04-09 NOTE — Telephone Encounter (Signed)
Patient says she is going to the bathroom more than usual.

## 2016-04-09 NOTE — Telephone Encounter (Signed)
It is common in menopause to have this symptom.  As her exam was normal in march and the symptoms were going on before that time per her history, I think it is ok to watch and let me know if it worsens or she has any additional symptoms.  Would be happy to see her at any time.

## 2016-04-09 NOTE — Telephone Encounter (Signed)
Spoke with patient. Patient reports that she has been experiencing frequent urination since before March. Patientw as seen for her aex on 11/11/2015 with Dr.Miller and reports she forgot to mention this at her appointment. She is currently taking hydrochlorothiazide 12.5 mg daily which she reports she has been taking for years. Denies any recent change to dosage. Reports she is getting up to use the restroom 3-4 timer per night. Usually gets up 2 times per night. Asking if this could be from her medication or if this is normal. Denies any urinary pressure, burning with urination, lower back pain, fever, or chills. "I do not want to start on any medication for this since that could cause other side effects. I just want to know if this is normal." Advised will need to be seen in the office for further evaluation to determine cause of frequent urination. Patient states she would like Dr.Miller's opinion prior to scheduling. Again states she in not interested in medication for urinary frequency.

## 2016-04-12 NOTE — Telephone Encounter (Signed)
Left message to call Teran Daughenbaugh at [phone]. 

## 2016-04-12 NOTE — Telephone Encounter (Signed)
Spoke with patient. Advised of message as seen below from Dr.Miller. She is agreeable and verbalizes understanding.  Routing to provider for final review. Patient agreeable to disposition. Will close encounter.  

## 2016-04-22 DIAGNOSIS — R079 Chest pain, unspecified: Secondary | ICD-10-CM | POA: Insufficient documentation

## 2016-04-22 DIAGNOSIS — R9431 Abnormal electrocardiogram [ECG] [EKG]: Secondary | ICD-10-CM | POA: Insufficient documentation

## 2016-04-23 ENCOUNTER — Ambulatory Visit (INDEPENDENT_AMBULATORY_CARE_PROVIDER_SITE_OTHER): Payer: Medicare Other | Admitting: Interventional Cardiology

## 2016-04-23 ENCOUNTER — Encounter: Payer: Self-pay | Admitting: Interventional Cardiology

## 2016-04-23 VITALS — BP 136/68 | HR 66 | Ht 62.0 in | Wt 141.0 lb

## 2016-04-23 DIAGNOSIS — R0789 Other chest pain: Secondary | ICD-10-CM | POA: Diagnosis not present

## 2016-04-23 DIAGNOSIS — R9431 Abnormal electrocardiogram [ECG] [EKG]: Secondary | ICD-10-CM

## 2016-04-23 DIAGNOSIS — E785 Hyperlipidemia, unspecified: Secondary | ICD-10-CM | POA: Diagnosis not present

## 2016-04-23 MED ORDER — ASPIRIN EC 81 MG PO TBEC: 81.0000 mg | DELAYED_RELEASE_TABLET | Freq: Every day | ORAL | 3 refills | Status: DC

## 2016-04-23 NOTE — Progress Notes (Signed)
Cardiology Office Note    Date:  04/23/2016   ID:  [name], DOB [birth year], MRN [id number]  PCP:  Precious Reel, MD  Cardiologist: Marissa Grooms, MD   Chief Complaint  Patient presents with  . Coronary Artery Disease  . Chest Pain    History of Present Illness:  Marissa Jacobs is a 80 y.o. female is referred for evaluation of recurring chest discomfort and transient EKG abnormality.  This is a pleasant 80 year old female with altered with [place]. For one year she has experienced intermittent episodes of chest discomfort that she poorly characterizes. She describes the chest by placing her hand over the chest and states it feels as though there is a tightness or pressure. These episodes occur spontaneously. They're not precipitated by physical activity. There is no radiation of the discomfort. There is no sweating. Concurrently, she has also noticed more dyspnea on exertion than usual. She and her husband usually walk the [employer] several times a week. Within this past year she has noted dyspnea with this activity that was not present several years ago. The walk is never precipitated any chest discomfort. She was seen in June after having had an episode of chest discomfort 1 week prior. An EKG done at [facility] by Marissa Molder, NP demonstrated anterolateral precordial T-wave inversion with poor R-wave progression. This finding was new compared to prior EKGs (potassium on this date was 3.4).Marland Kitchen  Past Medical History:  Diagnosis Date  . Anemia    secondary to taking omeprazole-on iron  . Diverticulosis 2/04  . GERD (gastroesophageal reflux disease)   . Hyperlipidemia   . Osteopenia   . Post traumatic stress disorder   . STD (sexually transmitted disease)    HSV    Past Surgical History:  Procedure Laterality Date  . APPENDECTOMY    . CERVICAL BIOPSY  W/ LOOP ELECTRODE EXCISION     CIN1  . COLPOSCOPY    . ESOPHAGEAL DILATION  2013    . FACIAL COSMETIC SURGERY  9/03  . KNEE SURGERY    . TONSILLECTOMY      Current Medications: Outpatient Medications Prior to Visit  Medication Sig Dispense Refill  . CALCIUM PO Take 600 mg by mouth 2 (two) times daily.     . Cholecalciferol (VITAMIN D PO) Take 2,500 mg by mouth daily.     . Coenzyme Q10 (CO Q 10 PO) Take by mouth daily.    Marland Kitchen estradiol (VIVELLE-DOT) 0.025 MG/24HR Place 1 patch onto the skin 2 (two) times a week. 24 patch 4  . hydrochlorothiazide (MICROZIDE) 12.5 MG capsule Take 12.5 mg by mouth 2 (two) times daily.     Marland Kitchen lisinopril (PRINIVIL,ZESTRIL) 10 MG tablet Take 10 mg by mouth daily.    . Magnesium 200 MG TABS Take by mouth at bedtime.    . Melatonin 3 MG CAPS Place under the tongue at bedtime.    . Multiple Vitamins-Minerals (MULTIVITAMIN WITH MINERALS) tablet Take 1 tablet by mouth daily. Reported on 11/11/2015    . Omega-3 Fatty Acids (FISH OIL PO) Take 600 mg by mouth 2 (two) times daily.     Marland Kitchen tretinoin (RETIN-A) 0.025 % cream     . clobetasol ointment (TEMOVATE) 0.05 % Apply topically 2 (two) times daily. (Patient not taking: Reported on 11/01/2014) 60 g 0  . Omeprazole 20 MG TBEC Take 1 tablet by mouth daily.     . progesterone (PROMETRIUM) 100 MG capsule Take  1 capsule (100 mg total) by mouth daily. (Patient not taking: Reported on 04/23/2016) 30 capsule 13  . progesterone (PROMETRIUM) 100 MG capsule Take 1 capsule (100 mg total) by mouth daily. (Patient not taking: Reported on 04/23/2016) 30 capsule 12   No facility-administered medications prior to visit.      Allergies:   Codeine and Amoxicillin   Social History   Social History  . Marital status: Divorced    Spouse name: N/A  . Number of children: N/A  . Years of education: N/A   Social History Main Topics  . Smoking status: Former Smoker    Quit date: 02/21/1963  . Smokeless tobacco: Never Used     Comment: in college  . Alcohol use 3.0 oz/week    5 Standard drinks or equivalent per week  .  Drug use: No  . Sexual activity: Yes    Partners: Male    Birth control/ protection: Post-menopausal   Other Topics Concern  . None   Social History Narrative  . None     Family History:  The patient's family history includes COPD in her mother; Sudden death in her father.   ROS:   Please see the history of present illness.    She has some stiffness in her neck. She has numbness and tingling in her hands and arms that awakens her from sleep. There is mid and low back discomfort for which she sees a our practice. She has had low potassium associated with hydrochlorothiazide therapy. The potassium was 3.4 on the day that the above EKG was done (note in the H&P).  All other systems reviewed and are negative.   PHYSICAL EXAM:   VS:  BP 136/68   Pulse 66   Ht '5\' 2"'$  (1.575 m)   Wt 141 lb (64 kg)   LMP 08/16/1993   BMI 25.79 kg/m    GEN: Well nourished, well developed, in no acute distress  HEENT: normal  Neck: no JVD, carotid bruits, or masses Cardiac: RRR; no murmurs, rubs, or gallops,no edema  Respiratory:  clear to auscultation bilaterally, normal work of breathing GI: soft, nontender, nondistended, + BS MS: no deformity or atrophy  Skin: warm and dry, no rash Neuro:  Alert and Oriented x 3, Strength and sensation are intact Psych: euthymic mood, full affect  Wt Readings from Last 3 Encounters:  04/23/16 141 lb (64 kg)  11/11/15 144 lb (65.3 kg)  11/01/14 141 lb 6.4 oz (64.1 kg)      Studies/Labs Reviewed:   EKG:  EKG  Normal sinus rhythm, nonspecific ST abnormality. Essentially normal tracing. When compared to the [place] electrocardiogram from June 2017 the poor R-wave progression and precordial V1 through V4 T wave inversion has normalized.  Recent Labs: No results found for requested labs within last 8760 hours.   Lipid Panel No results found for: CHOL, TRIG, HDL, CHOLHDL, VLDL, LDLCALC, LDLDIRECT  Additional studies/ records that were  reviewed today include:  Data from [facility] was reviewed. Laboratory data with a potassium of 3.4 in June was noted. An echocardiogram performed in 2011 demonstrated normal LV size and function with EF 75%. There was mild LVH noted.    ASSESSMENT:    1. Other chest pain   2. Abnormal ECG   3. Hyperlipidemia      PLAN:  In order of problems listed above:  1. The chest discomfort in conjunction with the transient EKG abnormalities noted above are worrisome for ischemic heart  disease. Her symptoms are somewhat atypical in that there is no exertion related discomfort. Episodes of discomfort appeared to occur at random and last less than 5 minutes. Over the past she has she has noted dyspnea on exertion. We need to exclude obstructive coronary disease. Other possibilities include coronary artery spasm and/or stress cardiomyopathy syndrome that was resolving at the time of EKG in June. To assess this we will perform a stress Myoview study to exclude reversible ischemia. This will also assess LV function. 2. The EKG in June at [facility] had the appearance of a recent anterolateral myocardial infarction. In comparison to today's EKG, the tracing has resolved to a normal appearing tracing. This pattern raises a question of stress cardiomyopathy type clinical syndrome at that time. 3. We discussed hyperlipidemia. She is anti-statin. Under the current circumstances I have recommended that she start an aspirin 81 mg per day. It would not be unreasonable to attempt LDL cholesterol controlled to near 70 which will require statin therapy. She wants to speak to her primary physician about this.    Medication Adjustments/Labs and Tests Ordered: Current medicines are reviewed at length with the patient today.  Concerns regarding medicines are outlined above.  Medication changes, Labs and Tests ordered today are listed in the Patient Instructions below. Patient Instructions   Your physician has recommended you make the following change in your medication:  1.) start aspirin 81 mg (enteric coated) once a day  Your physician has requested that you have en exercise stress myoview. For further information please visit [email or web address]. Please follow instruction sheet, as given.  Follow up with your physician will depend on test results.      Signed, Marissa Grooms, MD  04/23/2016 12:49 PM    [address], [place],   [id number] Phone: [phone]; Fax: [phone]

## 2016-04-23 NOTE — Patient Instructions (Signed)
Your physician has recommended you make the following change in your medication:  1.) start aspirin 81 mg (enteric coated) once a day  Your physician has requested that you have en exercise stress myoview. For further information please visit [email or web address]. Please follow instruction sheet, as given.  Follow up with your physician will depend on test results.

## 2016-04-27 ENCOUNTER — Telehealth (HOSPITAL_COMMUNITY): Payer: Self-pay | Admitting: *Deleted

## 2016-04-27 NOTE — Telephone Encounter (Signed)
Left message on voicemail per DPR in reference to upcoming appointment scheduled on 04/29/16 with detailed instructions given per Myocardial Perfusion Study Information Sheet for the test. LM to arrive 15 minutes early, and that it is imperative to arrive on time for appointment to keep from having the test rescheduled. If you need to cancel or reschedule your appointment, please call the office within 24 hours of your appointment. Failure to do so may result in a cancellation of your appointment, and a $50 no show fee. Phone number given for call back for any questions. Kirstie Peri

## 2016-04-28 ENCOUNTER — Telehealth: Payer: Self-pay | Admitting: Interventional Cardiology

## 2016-04-28 NOTE — Telephone Encounter (Signed)
Did not need the encounter

## 2016-04-29 ENCOUNTER — Ambulatory Visit (HOSPITAL_COMMUNITY): Payer: Medicare Other | Attending: Cardiology

## 2016-04-29 DIAGNOSIS — R0789 Other chest pain: Secondary | ICD-10-CM | POA: Diagnosis present

## 2016-04-29 LAB — MYOCARDIAL PERFUSION IMAGING
Estimated workload: 7 METS
Exercise duration (min): 5 min
Exercise duration (sec): 0 s
LV dias vol: 52 mL (ref 46–106)
LV sys vol: 5 mL
MPHR: 142 {beats}/min
Peak HR: 142 {beats}/min
Percent HR: 100 %
RATE: 0.29
Rest HR: 68 {beats}/min
SDS: 2
SRS: 5
SSS: 7
TID: 0.73

## 2016-04-29 MED ORDER — TECHNETIUM TC 99M TETROFOSMIN IV KIT
10.7000 | PACK | Freq: Once | INTRAVENOUS | Status: AC | PRN
  Administered 2016-04-29: 11 via INTRAVENOUS
  Filled 2016-04-29: qty 11

## 2016-04-29 MED ORDER — TECHNETIUM TC 99M TETROFOSMIN IV KIT
10.6000 | PACK | Freq: Once | INTRAVENOUS | Status: DC | PRN
  Filled 2016-04-29: qty 11

## 2016-04-29 MED ORDER — REGADENOSON 0.4 MG/5ML IV SOLN: 0.4000 mg | Freq: Once | INTRAVENOUS | Status: DC

## 2016-04-29 MED ORDER — TECHNETIUM TC 99M TETROFOSMIN IV KIT
32.6000 | PACK | Freq: Once | INTRAVENOUS | Status: DC | PRN
  Filled 2016-04-29: qty 33

## 2016-04-29 MED ORDER — TECHNETIUM TC 99M TETROFOSMIN IV KIT
32.6000 | PACK | Freq: Once | INTRAVENOUS | Status: AC | PRN
  Administered 2016-04-29: 33 via INTRAVENOUS
  Filled 2016-04-29: qty 33

## 2016-07-06 ENCOUNTER — Other Ambulatory Visit: Payer: Self-pay | Admitting: Internal Medicine

## 2016-07-06 DIAGNOSIS — Z1231 Encounter for screening mammogram for malignant neoplasm of breast: Secondary | ICD-10-CM

## 2016-07-13 ENCOUNTER — Other Ambulatory Visit: Payer: Self-pay | Admitting: Internal Medicine

## 2016-07-13 DIAGNOSIS — M8580 Other specified disorders of bone density and structure, unspecified site: Secondary | ICD-10-CM

## 2016-07-21 ENCOUNTER — Ambulatory Visit
Admission: RE | Admit: 2016-07-21 | Discharge: 2016-07-21 | Disposition: A | Discharge status: Home / Self Care | Payer: Medicare Other | Source: Ambulatory Visit | Attending: Internal Medicine | Admitting: Internal Medicine

## 2016-07-29 ENCOUNTER — Ambulatory Visit
Admission: RE | Admit: 2016-07-29 | Discharge: 2016-07-29 | Disposition: A | Discharge status: Home / Self Care | Payer: Medicare Other | Source: Ambulatory Visit | Attending: Internal Medicine | Admitting: Internal Medicine

## 2016-07-29 DIAGNOSIS — M8580 Other specified disorders of bone density and structure, unspecified site: Secondary | ICD-10-CM

## 2016-08-24 ENCOUNTER — Ambulatory Visit
Admission: RE | Admit: 2016-08-24 | Discharge: 2016-08-24 | Disposition: A | Discharge status: Home / Self Care | Payer: Medicare HMO | Source: Ambulatory Visit | Attending: Internal Medicine | Admitting: Internal Medicine

## 2016-08-24 DIAGNOSIS — Z1231 Encounter for screening mammogram for malignant neoplasm of breast: Secondary | ICD-10-CM | POA: Diagnosis not present

## 2016-08-27 DIAGNOSIS — Z6826 Body mass index (BMI) 26.0-26.9, adult: Secondary | ICD-10-CM | POA: Diagnosis not present

## 2016-08-27 DIAGNOSIS — I10 Essential (primary) hypertension: Secondary | ICD-10-CM | POA: Diagnosis not present

## 2016-08-27 DIAGNOSIS — G5603 Carpal tunnel syndrome, bilateral upper limbs: Secondary | ICD-10-CM | POA: Diagnosis not present

## 2016-08-27 DIAGNOSIS — M4722 Other spondylosis with radiculopathy, cervical region: Secondary | ICD-10-CM | POA: Diagnosis not present

## 2016-08-27 DIAGNOSIS — M5030 Other cervical disc degeneration, unspecified cervical region: Secondary | ICD-10-CM | POA: Diagnosis not present

## 2016-08-27 DIAGNOSIS — M542 Cervicalgia: Secondary | ICD-10-CM | POA: Diagnosis not present

## 2016-08-31 DIAGNOSIS — R69 Illness, unspecified: Secondary | ICD-10-CM | POA: Diagnosis not present

## 2016-09-24 DIAGNOSIS — G5603 Carpal tunnel syndrome, bilateral upper limbs: Secondary | ICD-10-CM | POA: Diagnosis not present

## 2016-09-24 DIAGNOSIS — G5623 Lesion of ulnar nerve, bilateral upper limbs: Secondary | ICD-10-CM | POA: Diagnosis not present

## 2016-09-24 DIAGNOSIS — M9981 Other biomechanical lesions of cervical region: Secondary | ICD-10-CM | POA: Diagnosis not present

## 2016-09-28 DIAGNOSIS — M7989 Other specified soft tissue disorders: Secondary | ICD-10-CM | POA: Diagnosis not present

## 2016-09-28 DIAGNOSIS — M79605 Pain in left leg: Secondary | ICD-10-CM | POA: Diagnosis not present

## 2016-09-28 DIAGNOSIS — M79604 Pain in right leg: Secondary | ICD-10-CM | POA: Diagnosis not present

## 2016-10-08 DIAGNOSIS — I10 Essential (primary) hypertension: Secondary | ICD-10-CM | POA: Diagnosis not present

## 2016-10-08 DIAGNOSIS — G5603 Carpal tunnel syndrome, bilateral upper limbs: Secondary | ICD-10-CM | POA: Diagnosis not present

## 2016-10-08 DIAGNOSIS — M5030 Other cervical disc degeneration, unspecified cervical region: Secondary | ICD-10-CM | POA: Diagnosis not present

## 2016-10-08 DIAGNOSIS — Z6826 Body mass index (BMI) 26.0-26.9, adult: Secondary | ICD-10-CM | POA: Diagnosis not present

## 2016-10-08 DIAGNOSIS — M4722 Other spondylosis with radiculopathy, cervical region: Secondary | ICD-10-CM | POA: Diagnosis not present

## 2016-10-20 DIAGNOSIS — L821 Other seborrheic keratosis: Secondary | ICD-10-CM | POA: Diagnosis not present

## 2016-10-20 DIAGNOSIS — L570 Actinic keratosis: Secondary | ICD-10-CM | POA: Diagnosis not present

## 2016-10-22 DIAGNOSIS — M1712 Unilateral primary osteoarthritis, left knee: Secondary | ICD-10-CM | POA: Diagnosis not present

## 2016-10-29 DIAGNOSIS — M1712 Unilateral primary osteoarthritis, left knee: Secondary | ICD-10-CM | POA: Diagnosis not present

## 2016-11-04 DIAGNOSIS — G5603 Carpal tunnel syndrome, bilateral upper limbs: Secondary | ICD-10-CM | POA: Diagnosis not present

## 2016-11-04 DIAGNOSIS — M79642 Pain in left hand: Secondary | ICD-10-CM | POA: Diagnosis not present

## 2016-11-04 DIAGNOSIS — M79641 Pain in right hand: Secondary | ICD-10-CM | POA: Diagnosis not present

## 2016-11-05 DIAGNOSIS — R209 Unspecified disturbances of skin sensation: Secondary | ICD-10-CM | POA: Diagnosis not present

## 2016-11-05 DIAGNOSIS — I10 Essential (primary) hypertension: Secondary | ICD-10-CM | POA: Diagnosis not present

## 2016-11-05 DIAGNOSIS — E559 Vitamin D deficiency, unspecified: Secondary | ICD-10-CM | POA: Diagnosis not present

## 2016-11-05 DIAGNOSIS — Z6827 Body mass index (BMI) 27.0-27.9, adult: Secondary | ICD-10-CM | POA: Diagnosis not present

## 2016-11-05 DIAGNOSIS — E041 Nontoxic single thyroid nodule: Secondary | ICD-10-CM | POA: Diagnosis not present

## 2016-11-05 DIAGNOSIS — E663 Overweight: Secondary | ICD-10-CM | POA: Diagnosis not present

## 2016-11-05 DIAGNOSIS — R208 Other disturbances of skin sensation: Secondary | ICD-10-CM | POA: Diagnosis not present

## 2016-11-08 ENCOUNTER — Other Ambulatory Visit: Payer: Self-pay | Admitting: Internal Medicine

## 2016-11-08 DIAGNOSIS — E041 Nontoxic single thyroid nodule: Secondary | ICD-10-CM

## 2016-11-12 ENCOUNTER — Ambulatory Visit
Admission: RE | Admit: 2016-11-12 | Discharge: 2016-11-12 | Disposition: A | Discharge status: Home / Self Care | Payer: Medicare HMO | Source: Ambulatory Visit | Attending: Internal Medicine | Admitting: Internal Medicine

## 2016-11-12 DIAGNOSIS — E042 Nontoxic multinodular goiter: Secondary | ICD-10-CM | POA: Diagnosis not present

## 2016-11-12 DIAGNOSIS — E041 Nontoxic single thyroid nodule: Secondary | ICD-10-CM

## 2016-11-15 ENCOUNTER — Encounter: Payer: Self-pay | Admitting: Obstetrics & Gynecology

## 2016-11-15 ENCOUNTER — Ambulatory Visit (INDEPENDENT_AMBULATORY_CARE_PROVIDER_SITE_OTHER): Payer: Medicare HMO | Admitting: Obstetrics & Gynecology

## 2016-11-15 VITALS — BP 136/70 | HR 78 | Resp 16 | Ht 61.0 in | Wt 145.0 lb

## 2016-11-15 DIAGNOSIS — Z01419 Encounter for gynecological examination (general) (routine) without abnormal findings: Secondary | ICD-10-CM | POA: Diagnosis not present

## 2016-11-15 DIAGNOSIS — Z961 Presence of intraocular lens: Secondary | ICD-10-CM | POA: Diagnosis not present

## 2016-11-15 MED ORDER — ESTRADIOL 0.025 MG/24HR TD PTTW: 1.0000 | MEDICATED_PATCH | TRANSDERMAL | 4 refills | Status: DC

## 2016-11-15 MED ORDER — PROMETRIUM 100 MG PO CAPS
100.0000 mg | ORAL_CAPSULE | Freq: Every day | ORAL | 12 refills | Status: DC
Start: 2016-11-15 — End: 2017-12-05

## 2016-11-15 NOTE — Progress Notes (Signed)
81 y.o. [id number] DivorcedCaucasianF here for annual exam.  Pt had questionable chest tightness, discomfort last year.  Was Marissa Jacobs at [facility].  EKG was abnormal.  She was referred to Dr. Tamala Jacobs.  Myocardial Perfusion Imaging performed 04/29/16 was normal.  Evaluation also showed low potassium that took four weeks with oral potassium to return to normal.  She has follow up with Dr. [name] last week.  Blood work was all normal.    Patient's last menstrual period was 08/16/1998.          Sexually active: Yes.    The current method of family planning is post menopausal status.    Exercising: Yes.    walking Smoker:  no  Health Maintenance: Pap:  11/11/15 Neg. 10/29/13 Neg.  History of abnormal Pap:  Yes, hx of LEEP 1997 MMG:  08/24/16 BIRADS1:neg  Colonoscopy: 12/25/10 f/u 10 years  BMD:   07/29/16 Osteopenia  TDaP:  Current with PCP Pneumonia vaccine(s):  Done with PCP Zostavax:   Done with PCP Hep C testing: not indicated Screening Labs: PCP   reports that she quit smoking about 53 years ago. She has never used smokeless tobacco. She reports that she drinks about 3.0 oz of alcohol per week . She reports that she does not use drugs.  Past Medical History:  Diagnosis Date  . Anemia    secondary to taking omeprazole-on iron  . Diverticulosis 2/04  . GERD (gastroesophageal reflux disease)   . Hyperlipidemia   . Osteopenia   . Post traumatic stress disorder   . STD (sexually transmitted disease)    HSV    Past Surgical History:  Procedure Laterality Date  . APPENDECTOMY    . CERVICAL BIOPSY  W/ LOOP ELECTRODE EXCISION     CIN1  . COLPOSCOPY    . ESOPHAGEAL DILATION  2013  . FACIAL COSMETIC SURGERY  9/03  . KNEE SURGERY    . TONSILLECTOMY      Current Outpatient Prescriptions  Medication Sig Dispense Refill  . CALCIUM PO Take 600 mg by mouth 2 (two) times daily.     . Cholecalciferol (VITAMIN D PO) Take 2,500 mg by mouth daily.     . Coenzyme Q10 (CO Q 10 PO) Take by  mouth daily.    Marland Kitchen EPIPEN 2-PAK 0.3 MG/0.3ML SOAJ injection     . estradiol (VIVELLE-DOT) 0.025 MG/24HR Place 1 patch onto the skin 2 (two) times a week. 24 patch 4  . folic acid (FOLVITE) 025 MCG tablet Take 400 mcg by mouth daily.    . hydrochlorothiazide (MICROZIDE) 12.5 MG capsule Take 12.5 mg by mouth daily.     Marland Kitchen lisinopril (PRINIVIL,ZESTRIL) 10 MG tablet Take 10 mg by mouth daily.    . Magnesium 200 MG TABS Take by mouth at bedtime.    . Melatonin 3 MG CAPS Place under the tongue at bedtime.    . Multiple Vitamins-Minerals (MULTIVITAMIN WITH MINERALS) tablet Take 1 tablet by mouth daily. Reported on 11/11/2015    . Omega-3 Fatty Acids (FISH OIL PO) Take 600 mg by mouth 2 (two) times daily.     Marland Kitchen omeprazole (PRILOSEC) 10 MG capsule Take 10 mg by mouth daily.  11  . PROMETRIUM 100 MG capsule Take 100 mg by mouth daily. For the first 15 days each month.    . tretinoin (RETIN-A) 0.025 % cream     . vitamin B-12 (CYANOCOBALAMIN) 1000 MCG tablet Take 1,000 mcg by mouth daily.     No  current facility-administered medications for this visit.    Facility-Administered Medications Ordered in Other Visits  Medication Dose Route Frequency Provider Last Rate Last Dose  . regadenoson (LEXISCAN) injection SOLN 0.4 mg  0.4 mg Intravenous Once Marissa Croitoru, MD      . technetium tetrofosmin (TC-MYOVIEW) injection 11 millicurie  11 millicurie Intravenous Once PRN Marissa Croitoru, MD      . technetium tetrofosmin (TC-MYOVIEW) injection 33 millicurie  33 millicurie Intravenous Once PRN Marissa Klein, MD        Family History  Problem Relation Age of Onset  . COPD Mother   . Sudden death Father     ROS:  Pertinent items are noted in HPI.  Otherwise, a comprehensive ROS was negative.  Exam:   BP 136/70 (BP Location: Left Arm, Patient Position: Sitting, Cuff Size: Normal)   Pulse 78   Resp 16   Ht '5\' 1"'$  (1.549 m)   Wt 145 lb (65.8 kg)   LMP 08/16/1998   BMI 27.40 kg/m   Weight change: -1#     Height: '5\' 1"'$  (154.9 cm)  Ht Readings from Last 3 Encounters:  11/15/16 '5\' 1"'$  (1.549 m)  04/23/16 '5\' 2"'$  (1.575 m)  11/11/15 '5\' 1"'$  (1.549 m)    General appearance: alert, cooperative and appears stated age Head: Normocephalic, without obvious abnormality, atraumatic Neck: no adenopathy, supple, symmetrical, trachea midline and thyroid normal to inspection and palpation Lungs: clear to auscultation bilaterally Breasts: normal appearance, no masses or tenderness Heart: regular rate and rhythm Abdomen: soft, non-tender; bowel sounds normal; no masses,  no organomegaly Extremities: extremities normal, atraumatic, no cyanosis or edema Skin: Skin color, texture, turgor normal. No rashes or lesions Lymph nodes: Cervical, supraclavicular, and axillary nodes normal. No abnormal inguinal nodes palpated Neurologic: Grossly normal   Pelvic: External genitalia:  no lesions              Urethra:  normal appearing urethra with no masses, tenderness or lesions              Bartholins and Skenes: normal                 Vagina: normal appearing vagina with normal color and discharge, no lesions              Cervix: no lesions              Pap taken: No. Bimanual Exam:  Uterus:  normal size, contour, position, consistency, mobility, non-tender              Adnexa: normal adnexa and no mass, fullness, tenderness               Rectovaginal: Confirms               Anus:  normal sphincter tone, no lesions  Chaperone was present for exam.  A:  Well Woman with normal exam PMP, on low dosed HRT Osteopenia Anxiety GERD H/O HSV without recent outbreak H/O esophageal stricture with h/o esophageal stretching.  Typically does this yearly.  Seen at [facility] with Marissa Jacobs.  Last appt 8/17.    P:   Mammogram guidelines reviewed Pap smear 2017.  No pap today. RX for Vivelle dot 0.'025mg'$  patches twice weekly #24/4RF and Prometrium '100mg'$  days 1-15 each month.  Pt can also take daily if desires.  #30/12RF.   Reviewed risks with pt, again this year, including DVT, PE, MI, breast cancer.  She desires to continue and is clearly aware  of risks. Blood work UTD with Dr. [name].  She will bring a copy of lab work for her records. return annually or prn

## 2016-12-07 DIAGNOSIS — G5601 Carpal tunnel syndrome, right upper limb: Secondary | ICD-10-CM | POA: Diagnosis not present

## 2016-12-21 DIAGNOSIS — M79641 Pain in right hand: Secondary | ICD-10-CM | POA: Diagnosis not present

## 2017-01-28 DIAGNOSIS — M722 Plantar fascial fibromatosis: Secondary | ICD-10-CM | POA: Diagnosis not present

## 2017-02-25 ENCOUNTER — Ambulatory Visit: Payer: Medicare Other | Admitting: Obstetrics & Gynecology

## 2017-03-03 DIAGNOSIS — R69 Illness, unspecified: Secondary | ICD-10-CM | POA: Diagnosis not present

## 2017-03-28 DIAGNOSIS — J029 Acute pharyngitis, unspecified: Secondary | ICD-10-CM | POA: Diagnosis not present

## 2017-03-28 DIAGNOSIS — I10 Essential (primary) hypertension: Secondary | ICD-10-CM | POA: Diagnosis not present

## 2017-04-06 DIAGNOSIS — L700 Acne vulgaris: Secondary | ICD-10-CM | POA: Diagnosis not present

## 2017-04-26 DIAGNOSIS — I10 Essential (primary) hypertension: Secondary | ICD-10-CM | POA: Diagnosis not present

## 2017-04-26 DIAGNOSIS — K229 Disease of esophagus, unspecified: Secondary | ICD-10-CM | POA: Diagnosis not present

## 2017-04-26 DIAGNOSIS — Z880 Allergy status to penicillin: Secondary | ICD-10-CM | POA: Diagnosis not present

## 2017-04-26 DIAGNOSIS — K222 Esophageal obstruction: Secondary | ICD-10-CM | POA: Diagnosis not present

## 2017-04-26 DIAGNOSIS — R1310 Dysphagia, unspecified: Secondary | ICD-10-CM | POA: Diagnosis not present

## 2017-04-26 DIAGNOSIS — K449 Diaphragmatic hernia without obstruction or gangrene: Secondary | ICD-10-CM | POA: Diagnosis not present

## 2017-05-02 DIAGNOSIS — E784 Other hyperlipidemia: Secondary | ICD-10-CM | POA: Diagnosis not present

## 2017-05-02 DIAGNOSIS — I10 Essential (primary) hypertension: Secondary | ICD-10-CM | POA: Diagnosis not present

## 2017-05-02 DIAGNOSIS — N390 Urinary tract infection, site not specified: Secondary | ICD-10-CM | POA: Diagnosis not present

## 2017-05-02 DIAGNOSIS — E041 Nontoxic single thyroid nodule: Secondary | ICD-10-CM | POA: Diagnosis not present

## 2017-05-02 DIAGNOSIS — R8299 Other abnormal findings in urine: Secondary | ICD-10-CM | POA: Diagnosis not present

## 2017-05-02 DIAGNOSIS — E559 Vitamin D deficiency, unspecified: Secondary | ICD-10-CM | POA: Diagnosis not present

## 2017-05-03 DIAGNOSIS — E041 Nontoxic single thyroid nodule: Secondary | ICD-10-CM | POA: Diagnosis not present

## 2017-05-03 DIAGNOSIS — I10 Essential (primary) hypertension: Secondary | ICD-10-CM | POA: Diagnosis not present

## 2017-05-09 DIAGNOSIS — E784 Other hyperlipidemia: Secondary | ICD-10-CM | POA: Diagnosis not present

## 2017-05-09 DIAGNOSIS — M859 Disorder of bone density and structure, unspecified: Secondary | ICD-10-CM | POA: Diagnosis not present

## 2017-05-09 DIAGNOSIS — I10 Essential (primary) hypertension: Secondary | ICD-10-CM | POA: Diagnosis not present

## 2017-05-09 DIAGNOSIS — L508 Other urticaria: Secondary | ICD-10-CM | POA: Diagnosis not present

## 2017-05-09 DIAGNOSIS — Z0000 Encounter for general adult medical examination without abnormal findings: Secondary | ICD-10-CM | POA: Diagnosis not present

## 2017-05-09 DIAGNOSIS — K219 Gastro-esophageal reflux disease without esophagitis: Secondary | ICD-10-CM | POA: Diagnosis not present

## 2017-05-09 DIAGNOSIS — E663 Overweight: Secondary | ICD-10-CM | POA: Diagnosis not present

## 2017-05-09 DIAGNOSIS — M25562 Pain in left knee: Secondary | ICD-10-CM | POA: Diagnosis not present

## 2017-05-09 DIAGNOSIS — R69 Illness, unspecified: Secondary | ICD-10-CM | POA: Diagnosis not present

## 2017-05-09 DIAGNOSIS — E041 Nontoxic single thyroid nodule: Secondary | ICD-10-CM | POA: Diagnosis not present

## 2017-05-18 DIAGNOSIS — Z1212 Encounter for screening for malignant neoplasm of rectum: Secondary | ICD-10-CM | POA: Diagnosis not present

## 2017-06-08 DIAGNOSIS — L821 Other seborrheic keratosis: Secondary | ICD-10-CM | POA: Diagnosis not present

## 2017-06-08 DIAGNOSIS — D2261 Melanocytic nevi of right upper limb, including shoulder: Secondary | ICD-10-CM | POA: Diagnosis not present

## 2017-06-08 DIAGNOSIS — L503 Dermatographic urticaria: Secondary | ICD-10-CM | POA: Diagnosis not present

## 2017-06-08 DIAGNOSIS — L700 Acne vulgaris: Secondary | ICD-10-CM | POA: Diagnosis not present

## 2017-06-08 DIAGNOSIS — L509 Urticaria, unspecified: Secondary | ICD-10-CM | POA: Diagnosis not present

## 2017-06-08 DIAGNOSIS — D692 Other nonthrombocytopenic purpura: Secondary | ICD-10-CM | POA: Diagnosis not present

## 2017-06-23 DIAGNOSIS — M1712 Unilateral primary osteoarthritis, left knee: Secondary | ICD-10-CM | POA: Diagnosis not present

## 2017-07-05 DIAGNOSIS — M1712 Unilateral primary osteoarthritis, left knee: Secondary | ICD-10-CM | POA: Diagnosis not present

## 2017-07-22 ENCOUNTER — Other Ambulatory Visit: Payer: Self-pay | Admitting: Obstetrics & Gynecology

## 2017-07-22 DIAGNOSIS — Z1231 Encounter for screening mammogram for malignant neoplasm of breast: Secondary | ICD-10-CM

## 2017-08-25 ENCOUNTER — Ambulatory Visit: Payer: Medicare HMO

## 2017-09-13 ENCOUNTER — Ambulatory Visit: Payer: Medicare HMO

## 2017-09-14 DIAGNOSIS — R42 Dizziness and giddiness: Secondary | ICD-10-CM | POA: Diagnosis not present

## 2017-09-14 DIAGNOSIS — Z6827 Body mass index (BMI) 27.0-27.9, adult: Secondary | ICD-10-CM | POA: Diagnosis not present

## 2017-09-14 DIAGNOSIS — R0789 Other chest pain: Secondary | ICD-10-CM | POA: Diagnosis not present

## 2017-09-14 DIAGNOSIS — R69 Illness, unspecified: Secondary | ICD-10-CM | POA: Diagnosis not present

## 2017-09-14 DIAGNOSIS — K219 Gastro-esophageal reflux disease without esophagitis: Secondary | ICD-10-CM | POA: Diagnosis not present

## 2017-09-16 ENCOUNTER — Telehealth: Payer: Self-pay

## 2017-09-16 NOTE — Telephone Encounter (Signed)
SENT REFERRAL TO SCHEDULING 

## 2017-10-04 ENCOUNTER — Ambulatory Visit: Payer: Medicare HMO

## 2017-10-12 DIAGNOSIS — G5602 Carpal tunnel syndrome, left upper limb: Secondary | ICD-10-CM | POA: Diagnosis not present

## 2017-10-18 ENCOUNTER — Ambulatory Visit: Payer: Medicare HMO

## 2017-11-07 ENCOUNTER — Telehealth: Payer: Self-pay

## 2017-11-07 DIAGNOSIS — R0789 Other chest pain: Secondary | ICD-10-CM | POA: Diagnosis not present

## 2017-11-07 DIAGNOSIS — M859 Disorder of bone density and structure, unspecified: Secondary | ICD-10-CM | POA: Diagnosis not present

## 2017-11-07 DIAGNOSIS — E7849 Other hyperlipidemia: Secondary | ICD-10-CM | POA: Diagnosis not present

## 2017-11-07 DIAGNOSIS — I10 Essential (primary) hypertension: Secondary | ICD-10-CM | POA: Diagnosis not present

## 2017-11-07 DIAGNOSIS — Z6827 Body mass index (BMI) 27.0-27.9, adult: Secondary | ICD-10-CM | POA: Diagnosis not present

## 2017-11-07 DIAGNOSIS — E663 Overweight: Secondary | ICD-10-CM | POA: Diagnosis not present

## 2017-11-07 NOTE — Telephone Encounter (Signed)
Referral sent to scheduling.

## 2017-11-08 ENCOUNTER — Ambulatory Visit: Payer: Medicare HMO

## 2017-12-01 DIAGNOSIS — R69 Illness, unspecified: Secondary | ICD-10-CM | POA: Diagnosis not present

## 2017-12-05 ENCOUNTER — Other Ambulatory Visit: Payer: Self-pay | Admitting: Obstetrics & Gynecology

## 2017-12-05 NOTE — Telephone Encounter (Signed)
Medication refill request: Prometrium 100mg  #30 Last AEX:  11-15-16 Next AEX: 01-20-18 Last MMG (if hormonal medication request): 08-24-16 EXN:[id number] APPT.12-06-17 Refill authorized: Please advise

## 2017-12-06 ENCOUNTER — Ambulatory Visit: Payer: Medicare HMO

## 2017-12-26 DIAGNOSIS — H5211 Myopia, right eye: Secondary | ICD-10-CM | POA: Diagnosis not present

## 2017-12-26 DIAGNOSIS — Z961 Presence of intraocular lens: Secondary | ICD-10-CM | POA: Diagnosis not present

## 2017-12-27 ENCOUNTER — Ambulatory Visit: Payer: Medicare HMO | Admitting: Cardiology

## 2017-12-27 ENCOUNTER — Encounter (INDEPENDENT_AMBULATORY_CARE_PROVIDER_SITE_OTHER): Payer: Self-pay

## 2017-12-27 ENCOUNTER — Encounter: Payer: Self-pay | Admitting: Cardiology

## 2017-12-27 VITALS — BP 130/78 | HR 79 | Ht 61.0 in | Wt 141.0 lb

## 2017-12-27 DIAGNOSIS — R0789 Other chest pain: Secondary | ICD-10-CM

## 2017-12-27 DIAGNOSIS — E781 Pure hyperglyceridemia: Secondary | ICD-10-CM | POA: Diagnosis not present

## 2017-12-27 NOTE — Progress Notes (Signed)
Cardiology Office Note   Date:  12/27/2017   ID:  Marissa Jacobs, DOB April 09, 1936, MRN [id number]  PCP:  Shon Baton, MD  Cardiologist:  Dr. Tamala Julian    Chief Complaint  Patient presents with  . Chest Pain      History of Present Illness: Marissa Jacobs is a 82 y.o. female who presents for chest pain.    On previous visit 04/23/16 pt had experienced intermittent episodes of chest discomfort that she poorly characterized. She described the chest pain at that time by placing her hand over the chest and states it feels as though there is a tightness or pressure. Those episodes occurred  spontaneously. Not precipitated by physical activity, no radiation of the discomfort, no sweating. Concurrently, she has also noticed more dyspnea on exertion than usual. She and her husband usually walk the [employer] several times a week.  She had noted dyspnea with this activity that was not present several years ago. The walk never precipitated any chest discomfort. She was seen in June 2017 after having had an episode of chest discomfort 1 week prior. An EKG done at [facility] by Janus Molder, NP demonstrated anterolateral precordial T-wave inversion with poor R-wave progression. This finding was new compared to prior EKGs (potassium on this date was 3.4)..  nuc study was done and neg for ischemia.  She had been doing well since 2017.    Today she reports episodes of chest tightness occurring with rest most of the time.   Occurs a couple times per month or none for a month.  When it occurs it is a feeling of doom.  No SOB or nausea or diaphoresis.   She continues to be active working out at [employer] and walking a lot.  She also has a hx of esophageal strictures and has had dilatations.  Last one 04/2017.  She is having episodes when food does not go down, but not all the time.     Past Medical History:  Diagnosis Date  . Anemia    secondary to taking omeprazole-on iron  . Diverticulosis  2/04  . GERD (gastroesophageal reflux disease)   . Hyperlipidemia   . Osteopenia   . Post traumatic stress disorder   . STD (sexually transmitted disease)    HSV    Past Surgical History:  Procedure Laterality Date  . APPENDECTOMY    . CERVICAL BIOPSY  W/ LOOP ELECTRODE EXCISION     CIN1  . COLPOSCOPY    . ESOPHAGEAL DILATION  2013  . FACIAL COSMETIC SURGERY  9/03  . KNEE SURGERY    . TONSILLECTOMY       Current Outpatient Medications  Medication Sig Dispense Refill  . CALCIUM PO Take 600 mg by mouth 2 (two) times daily.     . cetirizine (ZYRTEC) 10 MG tablet Take 1 tablet by mouth as needed.    . Cholecalciferol (VITAMIN D PO) Take 2,500 mg by mouth daily.     . clindamycin (CLEOCIN T) 1 % external solution Apply 1 application topically as needed.    . Coenzyme Q10 (CO Q 10 PO) Take by mouth daily.    Marland Kitchen EPIPEN 2-PAK 0.3 MG/0.3ML SOAJ injection     . estradiol (VIVELLE-DOT) 0.025 MG/24HR Place 1 patch onto the skin 2 (two) times a week. 24 patch 4  . folic acid (FOLVITE) 852 MCG tablet Take 400 mcg by mouth daily.    . hydrochlorothiazide (MICROZIDE) 12.5 MG capsule Take  12.5 mg by mouth daily.     Marland Kitchen lisinopril (PRINIVIL,ZESTRIL) 10 MG tablet Take 10 mg by mouth daily.    . Magnesium 200 MG TABS Take by mouth at bedtime.    . Multiple Vitamins-Minerals (MULTIVITAMIN WITH MINERALS) tablet Take 1 tablet by mouth daily. Reported on 11/11/2015    . Omega-3 Fatty Acids (FISH OIL PO) Take 600 mg by mouth 2 (two) times daily.     Marland Kitchen omeprazole (PRILOSEC) 10 MG capsule Take 10 mg by mouth daily.  11  . PROMETRIUM 100 MG capsule TAKE 1 CAPSULE (100 MG TOTAL) BY MOUTH DAILY. 30 capsule 1  . tretinoin (RETIN-A) 0.025 % cream     . vitamin B-12 (CYANOCOBALAMIN) 1000 MCG tablet Take 1,000 mcg by mouth daily.     No current facility-administered medications for this visit.     Allergies:   Codeine and Amoxicillin    Social History:  The patient  reports that she quit smoking about 54  years ago. She has never used smokeless tobacco. She reports that she drinks about 3.0 oz of alcohol per week. She reports that she does not use drugs.   Family History:  The patient's family history includes COPD in her mother; Healthy in her brother; Sudden death (age of onset: 67) in her father.    ROS:  General:no colds or fevers, no weight changes Skin:no rashes or ulcers HEENT:no blurred vision, no congestion CV:see HPI PUL:see HPI GI:no diarrhea constipation or melena, no indigestion see HPI GU:no hematuria, no dysuria MS:no joint pain, no claudication Neuro:no syncope, no lightheadedness Endo:no diabetes, no thyroid disease  Wt Readings from Last 3 Encounters:  12/27/17 141 lb (64 kg)  11/15/16 145 lb (65.8 kg)  04/23/16 141 lb (64 kg)     PHYSICAL EXAM: VS:  BP 130/78   Pulse 79   Ht 5\' 1"  (1.549 m)   Wt 141 lb (64 kg)   LMP 08/16/1998   SpO2 97%   BMI 26.64 kg/m  , BMI Body mass index is 26.64 kg/m. General:Pleasant affect, NAD Skin:Warm and dry, brisk capillary refill HEENT:normocephalic, sclera clear, mucus membranes moist Neck:supple, no JVD, no bruits  Heart:S1S2 RRR without murmur, gallup, rub or click Lungs:clear without rales, rhonchi, or wheezes QBH:ALPF, non tender, + BS, do not palpate liver spleen or masses Ext:no lower ext edema, 2+ pedal pulses, 2+ radial pulses Neuro:alert and oriented X 3, MAE, follows commands, + facial symmetry    EKG:  EKG is ordered today. The ekg ordered today demonstrates SR normal EKG   Recent Labs: No results found for requested labs within last 8760 hours.    Lipid Panel No results found for: CHOL, TRIG, HDL, CHOLHDL, VLDL, LDLCALC, LDLDIRECT     Other studies Reviewed: Additional studies/ records that were reviewed today include:  nuc study 04/29/16 .Study Highlights     Nuclear stress EF: 91%.  Blood pressure demonstrated a normal response to exercise.  There was no ST segment deviation noted  during stress.  No T wave inversion was noted during stress.  The study is normal.  This is a low risk study.   Low risk stress nuclear study with normal perfusion and normal left ventricular regional and global systolic function.        ASSESSMENT AND PLAN:  1.  Chest pain/tightness.  Occurs with rest not so much with exertion.  Will occur once a month and then maybe none for a month.   Will do exercise myoview, which  we can compare to her last one.  She will follow up with Dr. Tamala Julian in 2 months, though if abnormal will have her seen earlier.    2.  HLD followed by PCP, continue diet- she prefers not to have a statin.   3.  Hx esophageal stricture last dilatation 04/2017.      Current medicines are reviewed with the patient today.  The patient Has no concerns regarding medicines.  The following changes have been made:  See above Labs/ tests ordered today include:see above  Disposition:   FU:  see above  Signed, Cecilie Kicks, NP  12/27/2017 2:34 PM    [facility] [address], [place], [place], [place] Phone: [phone]; Fax: [phone]

## 2017-12-27 NOTE — Patient Instructions (Addendum)
Medication Instructions:  Your physician recommends that you continue on your current medications as directed. Please refer to the Current Medication list given to you today.   Labwork: None ordered  Testing/Procedures: Your physician has requested that you have en exercise stress myoview. For further information please visit [email or web address]. Please follow instruction sheet, as given.   Follow-Up: Your physician recommends that you schedule a follow-up appointment in: [address] [facility]   Any Other Special Instructions Will Be Listed Below (If Applicable).  [facility] at [facility] [address], [place] Marissa Jacobs, [place] [address] Phone: [phone]  Dec 27, 2017    Marissa Jacobs DOB: 1936/06/27 MRN: [id number] [address] [place] [address]   Dear Ms. Marissa Jacobs,  You are scheduled for a Myocardial Perfusion Imaging Study on:   at .  Please arrive 15 minutes prior to your appointment time for registration and insurance purposes.  The test will take approximately 3 to 4 hours to complete; you may bring reading material.  If someone comes with you to your appointment, they will need to remain in the main lobby due to limited space in the testing area.  How to prepare for your Myocardial Perfusion Test: . Do not eat or drink 3 hours prior to your test, except you may have water. . Do not consume products containing caffeine (regular or decaffeinated) 12 hours prior to your test. (ex: coffee, chocolate, sodas, tea). . Do bring a list of your current medications with you.  If not listed below, you may take your medications as normal. . Do wear comfortable clothes (no dresses or overalls) and walking shoes, tennis shoes preferred (No heels or open toe shoes are allowed). . Do NOT wear cologne, perfume, aftershave, or lotions (deodorant is allowed). . If these instructions are not followed, your test will have to be  rescheduled.  Please report to [address], [address] for your test.  If you have questions or concerns about your appointment, you can call the Nuclear Lab at [phone].  If you cannot keep your appointment, please provide 24 hours notification to the Nuclear Lab, to avoid a possible $50 charge to your account.   If you need a refill on your cardiac medications before your next appointment, please call your pharmacy.

## 2018-01-10 ENCOUNTER — Encounter (HOSPITAL_COMMUNITY): Payer: Medicare HMO

## 2018-01-10 ENCOUNTER — Ambulatory Visit: Payer: Medicare HMO

## 2018-01-16 ENCOUNTER — Telehealth (HOSPITAL_COMMUNITY): Payer: Self-pay | Admitting: *Deleted

## 2018-01-16 NOTE — Telephone Encounter (Signed)
Patient given detailed instructions per Myocardial Perfusion Study Information Sheet for the test on 01/19/18. Patient notified to arrive 15 minutes early and that it is imperative to arrive on time for appointment to keep from having the test rescheduled.  If you need to cancel or reschedule your appointment, please call the office within 24 hours of your appointment. . Patient verbalized understanding.Kirstie Peri

## 2018-01-19 ENCOUNTER — Ambulatory Visit (HOSPITAL_COMMUNITY): Payer: Medicare HMO | Attending: Cardiology

## 2018-01-19 DIAGNOSIS — R079 Chest pain, unspecified: Secondary | ICD-10-CM | POA: Insufficient documentation

## 2018-01-19 DIAGNOSIS — R0789 Other chest pain: Secondary | ICD-10-CM | POA: Diagnosis not present

## 2018-01-19 LAB — MYOCARDIAL PERFUSION IMAGING
Estimated workload: 6.1 METS
Exercise duration (min): 4 min
Exercise duration (sec): 15 s
LV dias vol: 57 mL (ref 46–106)
LV sys vol: 6 mL
MPHR: 140 {beats}/min
Peak HR: 133 {beats}/min
Percent HR: 95 %
RATE: 0.3
Rest HR: 65 {beats}/min
SDS: 2
SRS: 9
SSS: 11
TID: 0.84

## 2018-01-19 MED ORDER — TECHNETIUM TC 99M TETROFOSMIN IV KIT
31.6000 | PACK | Freq: Once | INTRAVENOUS | Status: AC | PRN
  Administered 2018-01-19: 31.6 via INTRAVENOUS
  Filled 2018-01-19: qty 32

## 2018-01-19 MED ORDER — TECHNETIUM TC 99M TETROFOSMIN IV KIT
10.3000 | PACK | Freq: Once | INTRAVENOUS | Status: AC | PRN
  Administered 2018-01-19: 10.3 via INTRAVENOUS
  Filled 2018-01-19: qty 11

## 2018-01-19 NOTE — Progress Notes (Signed)
Pt has been made aware of normal result and verbalized understanding.  jw 01/19/18

## 2018-01-20 ENCOUNTER — Other Ambulatory Visit: Payer: Self-pay

## 2018-01-20 ENCOUNTER — Encounter

## 2018-01-20 ENCOUNTER — Encounter: Payer: Self-pay | Admitting: Obstetrics & Gynecology

## 2018-01-20 ENCOUNTER — Ambulatory Visit (INDEPENDENT_AMBULATORY_CARE_PROVIDER_SITE_OTHER): Payer: Medicare HMO | Admitting: Obstetrics & Gynecology

## 2018-01-20 VITALS — BP 114/74 | HR 66 | Resp 14 | Ht 60.5 in | Wt 140.4 lb

## 2018-01-20 DIAGNOSIS — Z01419 Encounter for gynecological examination (general) (routine) without abnormal findings: Secondary | ICD-10-CM | POA: Diagnosis not present

## 2018-01-20 DIAGNOSIS — N3281 Overactive bladder: Secondary | ICD-10-CM

## 2018-01-20 MED ORDER — ESTRADIOL 0.025 MG/24HR TD PTTW: 1.0000 | MEDICATED_PATCH | TRANSDERMAL | 4 refills | Status: DC

## 2018-01-20 MED ORDER — PROMETRIUM 100 MG PO CAPS: 100.0000 mg | ORAL_CAPSULE | Freq: Every day | ORAL | 12 refills | Status: DC

## 2018-01-20 MED ORDER — PROGESTERONE MICRONIZED 100 MG PO CAPS: 100.0000 mg | ORAL_CAPSULE | Freq: Every day | ORAL | 12 refills | Status: DC

## 2018-01-20 NOTE — Progress Notes (Signed)
82 y.o. [id number] DivorcedCaucasianF here for annual exam.  Just had a stress test yesterday due to atypical chest pain.  Reports she gets a "feeling" with some chest pressure.  Sensation lasts about 10 minutes.  When this occurs, she just wants to lay down and rest until it goes away.  Stress test was normal.  Reports states left ventricular ejection fraction is hyperdynamic.    Denies vaginal bleeding.    Does not want to stop HRT.  Is aware of risks.  We discuss this every time she is here.  Risks of stroke, MI, breast cancer, DVT/PE discussed.    Gets up twice at night.  Wears little mini pads.  Does have occasional leakage.  Would like to do something non-invasive for this.  PCP:  Dr. Virgina Jock.  Did blood work in March and this was normal.    Patient's last menstrual period was 08/16/1998.          Sexually active: Yes.    The current method of family planning is post menopausal status.    Exercising: Yes.    cardio Smoker:  Former smoker  Health Maintenance: Pap:  11/11/15 Neg   10/29/13 Neg History of abnormal Pap:  Yes, LEEP MMG:  08/24/16 BIRADS1:Neg. Has appt 02/01/18  Colonoscopy:  12/2010 f/u 10 years  BMD:   07/29/2016 osteopenia  TDaP:  Done with PCP Pneumonia vaccine(s):  Done with PCP Shingrix:   Done with PCP  Hep C testing: not indicated  Screening Labs: PCP, Hb today: PCP, Urine today: not collected    reports that she quit smoking about 54 years ago. She has never used smokeless tobacco. She reports that she drinks about 3.0 oz of alcohol per week. She reports that she does not use drugs.  Past Medical History:  Diagnosis Date  . Anemia    secondary to taking omeprazole-on iron  . Diverticulosis 2/04  . GERD (gastroesophageal reflux disease)   . Hyperlipidemia   . Osteopenia   . Post traumatic stress disorder   . STD (sexually transmitted disease)    HSV    Past Surgical History:  Procedure Laterality Date  . APPENDECTOMY    . CERVICAL BIOPSY  W/ LOOP ELECTRODE  EXCISION     CIN1  . COLPOSCOPY    . ESOPHAGEAL DILATION  2013  . FACIAL COSMETIC SURGERY  9/03  . KNEE SURGERY    . TONSILLECTOMY      Current Outpatient Medications  Medication Sig Dispense Refill  . CALCIUM PO Take 600 mg by mouth 2 (two) times daily.     . cetirizine (ZYRTEC) 10 MG tablet Take 1 tablet by mouth 2 (two) times a week.     . Cholecalciferol (VITAMIN D PO) Take 2,500 mg by mouth daily.     . clindamycin (CLEOCIN T) 1 % external solution Apply 1 application topically as needed.    . Coenzyme Q10 (CO Q 10 PO) Take by mouth daily.    Marland Kitchen EPIPEN 2-PAK 0.3 MG/0.3ML SOAJ injection     . estradiol (VIVELLE-DOT) 0.025 MG/24HR Place 1 patch onto the skin 2 (two) times a week. 24 patch 4  . folic acid (FOLVITE) 952 MCG tablet Take 400 mcg by mouth daily.    . hydrochlorothiazide (MICROZIDE) 12.5 MG capsule Take 12.5 mg by mouth daily.     Marland Kitchen LATISSE 0.03 % ophthalmic solution APPLY TO UPPER EYE LASHES AT BEDTIME AS NEEDED  5  . lisinopril (PRINIVIL,ZESTRIL) 10 MG tablet Take 10  mg by mouth daily.    . Magnesium 200 MG TABS Take by mouth at bedtime.    . Multiple Vitamins-Minerals (MULTIVITAMIN WITH MINERALS) tablet Take 1 tablet by mouth daily. Reported on 11/11/2015    . Omega-3 Fatty Acids (FISH OIL PO) Take 600 mg by mouth 2 (two) times daily.     Marland Kitchen omeprazole (PRILOSEC) 10 MG capsule Take 10 mg by mouth daily.  11  . PROMETRIUM 100 MG capsule TAKE 1 CAPSULE (100 MG TOTAL) BY MOUTH DAILY. 30 capsule 1  . tretinoin (RETIN-A) 0.025 % cream      No current facility-administered medications for this visit.     Family History  Problem Relation Age of Onset  . COPD Mother   . Sudden death Father 2  . Healthy Brother     Review of Systems  Genitourinary: Positive for frequency.  All other systems reviewed and are negative.   Exam:   BP 114/74 (BP Location: Right Arm, Patient Position: Sitting, Cuff Size: Normal)   Pulse 66   Resp 14   Ht 5' 0.5" (1.537 m)   Wt 140 lb  6.4 oz (63.7 kg)   LMP 08/16/1998   BMI 26.97 kg/m   Height: 5' 0.5" (153.7 cm)  Ht Readings from Last 3 Encounters:  01/20/18 5' 0.5" (1.537 m)  01/19/18 5\' 1"  (1.549 m)  12/27/17 5\' 1"  (1.549 m)    General appearance: alert, cooperative and appears stated age Head: Normocephalic, without obvious abnormality, atraumatic Neck: no adenopathy, supple, symmetrical, trachea midline and thyroid normal to inspection and palpation Lungs: clear to auscultation bilaterally Breasts: normal appearance, no masses or tenderness Heart: regular rate and rhythm Abdomen: soft, non-tender; bowel sounds normal; no masses,  no organomegaly Extremities: extremities normal, atraumatic, no cyanosis or edema Skin: Skin color, texture, turgor normal. No rashes or lesions Lymph nodes: Cervical, supraclavicular, and axillary nodes normal. No abnormal inguinal nodes palpated Neurologic: Grossly normal   Pelvic: External genitalia:  no lesions              Urethra:  normal appearing urethra with no masses, tenderness or lesions              Bartholins and Skenes: normal                 Vagina: normal appearing vagina with normal color and discharge, no lesions              Cervix: no lesions              Pap taken: No. Bimanual Exam:  Uterus:  normal size, contour, position, consistency, mobility, non-tender              Adnexa: normal adnexa and no mass, fullness, tenderness               Rectovaginal: Confirms               Anus:  normal sphincter tone, no lesions  Chaperone was present for exam.  A:  Well Woman with normal exam PMP, on low dosed HRT Osteopenia Anxiety GERD H/o HSV without recent outbreak OAB, mild SUI  P:   Mammogram guidelines reviewed.  She has this scheduled already in about 10 days Rx for Vivelle dot 0.025mg  patches twice weekly.  She is considering cutting this in 1/2.  #24/4RF.  Prometrium 100mg  days 1-15 days each month.  #30/12RF.  Risks reviewed.  Wants Brand only. pap  smear not indicated.  Will  stop doing pap smears Blood work it UTD with Dr. Virgina Jock Referral to PT Return annually or prn

## 2018-01-23 DIAGNOSIS — R198 Other specified symptoms and signs involving the digestive system and abdomen: Secondary | ICD-10-CM | POA: Diagnosis not present

## 2018-01-23 DIAGNOSIS — R1310 Dysphagia, unspecified: Secondary | ICD-10-CM | POA: Diagnosis not present

## 2018-01-23 DIAGNOSIS — K219 Gastro-esophageal reflux disease without esophagitis: Secondary | ICD-10-CM | POA: Diagnosis not present

## 2018-02-01 ENCOUNTER — Ambulatory Visit
Admission: RE | Admit: 2018-02-01 | Discharge: 2018-02-01 | Disposition: A | Discharge status: Home / Self Care | Payer: Medicare HMO | Source: Ambulatory Visit | Attending: Obstetrics & Gynecology | Admitting: Obstetrics & Gynecology

## 2018-02-01 DIAGNOSIS — Z1231 Encounter for screening mammogram for malignant neoplasm of breast: Secondary | ICD-10-CM | POA: Diagnosis not present

## 2018-02-21 DIAGNOSIS — R1310 Dysphagia, unspecified: Secondary | ICD-10-CM | POA: Diagnosis not present

## 2018-02-21 DIAGNOSIS — K449 Diaphragmatic hernia without obstruction or gangrene: Secondary | ICD-10-CM | POA: Diagnosis not present

## 2018-02-21 DIAGNOSIS — Z880 Allergy status to penicillin: Secondary | ICD-10-CM | POA: Diagnosis not present

## 2018-02-21 DIAGNOSIS — I10 Essential (primary) hypertension: Secondary | ICD-10-CM | POA: Diagnosis not present

## 2018-02-21 DIAGNOSIS — K222 Esophageal obstruction: Secondary | ICD-10-CM | POA: Diagnosis not present

## 2018-02-21 DIAGNOSIS — K219 Gastro-esophageal reflux disease without esophagitis: Secondary | ICD-10-CM | POA: Diagnosis not present

## 2018-03-02 DIAGNOSIS — R350 Frequency of micturition: Secondary | ICD-10-CM | POA: Diagnosis not present

## 2018-03-02 DIAGNOSIS — M6281 Muscle weakness (generalized): Secondary | ICD-10-CM | POA: Diagnosis not present

## 2018-03-02 DIAGNOSIS — M62838 Other muscle spasm: Secondary | ICD-10-CM | POA: Diagnosis not present

## 2018-03-02 DIAGNOSIS — R351 Nocturia: Secondary | ICD-10-CM | POA: Diagnosis not present

## 2018-03-02 DIAGNOSIS — N393 Stress incontinence (female) (male): Secondary | ICD-10-CM | POA: Diagnosis not present

## 2018-03-30 DIAGNOSIS — R69 Illness, unspecified: Secondary | ICD-10-CM | POA: Diagnosis not present

## 2018-04-11 ENCOUNTER — Encounter: Payer: Self-pay | Admitting: Interventional Cardiology

## 2018-04-19 DIAGNOSIS — L700 Acne vulgaris: Secondary | ICD-10-CM | POA: Diagnosis not present

## 2018-04-19 DIAGNOSIS — L821 Other seborrheic keratosis: Secondary | ICD-10-CM | POA: Diagnosis not present

## 2018-04-19 DIAGNOSIS — L570 Actinic keratosis: Secondary | ICD-10-CM | POA: Diagnosis not present

## 2018-04-20 DIAGNOSIS — I10 Essential (primary) hypertension: Secondary | ICD-10-CM | POA: Diagnosis not present

## 2018-04-20 DIAGNOSIS — R42 Dizziness and giddiness: Secondary | ICD-10-CM | POA: Diagnosis not present

## 2018-05-01 NOTE — Progress Notes (Signed)
Cardiology Office Note:    Date:  05/02/2018   ID:  Marissa Jacobs, DOB April 01, 1936, MRN [id number]  PCP:  Shon Baton, MD  Cardiologist:  No primary care provider on file.   Referring MD: Shon Baton, MD   Chief Complaint  Patient presents with  . Chest Pain    History of Present Illness:    Marissa Jacobs is a 82 y.o. female with a hx recurring chest discomfort and transient EKG abnormality.   Continues to have episodes of chest pressure in the lower sternum that can last up to 15 to 20 minutes.  Episodes are random.  Episodes are not precipitated by physical activity.  There is no associated dyspnea or radiation to the arms or neck.  Last episode occurred greater than a month ago.  Prior work-up included a stress nuclear study in June that was low risk for evidence of myocardial ischemia.   Past Medical History:  Diagnosis Date  . Anemia    secondary to taking omeprazole-on iron  . Diverticulosis 2/04  . GERD (gastroesophageal reflux disease)   . Hyperlipidemia   . Osteopenia   . Post traumatic stress disorder   . STD (sexually transmitted disease)    HSV    Past Surgical History:  Procedure Laterality Date  . APPENDECTOMY    . CERVICAL BIOPSY  W/ LOOP ELECTRODE EXCISION     CIN1  . COLPOSCOPY    . ESOPHAGEAL DILATION  2013  . FACIAL COSMETIC SURGERY  9/03  . KNEE SURGERY    . TONSILLECTOMY      Current Medications: Current Meds  Medication Sig  . CALCIUM PO Take 600 mg by mouth 2 (two) times daily.   . cetirizine (ZYRTEC) 10 MG tablet Take 1 tablet by mouth 2 (two) times a week.   . Cholecalciferol (VITAMIN D PO) Take 2,500 mg by mouth daily.   . clindamycin (CLEOCIN T) 1 % external solution Apply 1 application topically 2 (two) times daily as needed (ACNE).   . Coenzyme Q10 (CO Q 10 PO) Take by mouth daily.  Marland Kitchen EPIPEN 2-PAK 0.3 MG/0.3ML SOAJ injection   . estradiol (VIVELLE-DOT) 0.025 MG/24HR Place 1 patch onto the skin 2 (two) times a week.  . folic acid  (FOLVITE) 793 MCG tablet Take 400 mcg by mouth daily.  . hydrochlorothiazide (MICROZIDE) 12.5 MG capsule Take 12.5 mg by mouth daily.   Marland Kitchen LATISSE 0.03 % ophthalmic solution APPLY TO UPPER EYE LASHES AT BEDTIME AS NEEDED  . lisinopril (PRINIVIL,ZESTRIL) 10 MG tablet Take 10 mg by mouth daily.  . Magnesium 300 MG CAPS Take 300 mg by mouth daily.  . Multiple Vitamins-Minerals (MULTIVITAMIN WITH MINERALS) tablet Take 1 tablet by mouth daily. Reported on 11/11/2015  . Omega-3 Fatty Acids (FISH OIL PO) Take 600 mg by mouth 2 (two) times daily.   Marland Kitchen omeprazole (PRILOSEC) 10 MG capsule Take 10 mg by mouth daily.  Marland Kitchen PROMETRIUM 100 MG capsule Take 1 capsule (100 mg total) by mouth daily.  Marland Kitchen tretinoin (RETIN-A) 0.025 % cream Apply 1 application topically at bedtime.      Allergies:   Codeine and Amoxicillin   Social History   Socioeconomic History  . Marital status: Divorced    Spouse name: Not on file  . Number of children: Not on file  . Years of education: Not on file  . Highest education level: Not on file  Occupational History  . Not on file  Social Needs  . Financial  resource strain: Not on file  . Food insecurity:    Worry: Not on file    Inability: Not on file  . Transportation needs:    Medical: Not on file    Non-medical: Not on file  Tobacco Use  . Smoking status: Former Smoker    Last attempt to quit: 02/21/1963    Years since quitting: 55.2  . Smokeless tobacco: Never Used  . Tobacco comment: in college  Substance and Sexual Activity  . Alcohol use: Yes    Alcohol/week: 5.0 standard drinks    Types: 5 Standard drinks or equivalent per week  . Drug use: No  . Sexual activity: Yes    Partners: Male    Birth control/protection: Post-menopausal  Lifestyle  . Physical activity:    Days per week: Not on file    Minutes per session: Not on file  . Stress: Not on file  Relationships  . Social connections:    Talks on phone: Not on file    Gets together: Not on file     Attends religious service: Not on file    Active member of club or organization: Not on file    Attends meetings of clubs or organizations: Not on file    Relationship status: Not on file  Other Topics Concern  . Not on file  Social History Narrative  . Not on file     Family History: The patient's family history includes COPD in her mother; Healthy in her brother; Sudden death (age of onset: 61) in her father.  ROS:   Please see the history of present illness.    Some shortness of breath with physical activity.  Has anxiety about taking medications.  All other systems reviewed and are negative.  EKGs/Labs/Other Studies Reviewed:    The following studies were reviewed today:  NUCLEAR STRESS TEST 01/19/2018: Study Highlights    The left ventricular ejection fraction is hyperdynamic (>65%).  Nuclear stress EF: 89%.  The study is normal.  There was less than 70mm of horizontal to upsloping ST segment depression at peak exercise.  Blood pressure demonstrated a normal response to exercise.  The patient exercised on the standard Bruce protocol for 4 minutes achieving 95% MPHR with mildly impaired exercise capacity at 6.1 mets.  This is a low risk study.     EKG:  EKG is not ordered today.    Recent Labs: No results found for requested labs within last 8760 hours.  Recent Lipid Panel No results found for: CHOL, TRIG, HDL, CHOLHDL, VLDL, LDLCALC, LDLDIRECT  Physical Exam:    VS:  BP (!) 144/72   Pulse 72   Ht 5\' 2"  (1.575 m)   Wt 143 lb 12.8 oz (65.2 kg)   LMP 08/16/1998   BMI 26.30 kg/m     Wt Readings from Last 3 Encounters:  05/02/18 143 lb 12.8 oz (65.2 kg)  01/20/18 140 lb 6.4 oz (63.7 kg)  01/19/18 141 lb (64 kg)     GEN:  Well nourished, well developed in no acute distress HEENT: Normal NECK: No JVD. LYMPHATICS: No lymphadenopathy CARDIAC: RRR, no murmur, S4 gallop, no edema. VASCULAR: 2+ bilateral radial pulses.  No bruits. RESPIRATORY:  Clear to  auscultation without rales, wheezing or rhonchi  ABDOMEN: Soft, non-tender, non-distended, No pulsatile mass, MUSCULOSKELETAL: No deformity  SKIN: Warm and dry NEUROLOGIC:  Alert and oriented x 3 PSYCHIATRIC:  Normal affect   ASSESSMENT:    1. Atypical chest pain   2.  Pure hyperglyceridemia   3. Essential hypertension    PLAN:    In order of problems listed above:  1. Differential is esophageal spasm versus angina pectoris related to coronary artery spasm.  Empirical use of sublingual nitroglycerin is recommended and discussed with the patient. 2. LDL is elevated at 145.  Under the circumstances it is probably a good idea to treat elevated lipids to an LDL target less than or equal to 70.  We discussed statin therapy and she is not interested in being on additional medication. 3. Blood pressure target 130/80 mmHg.  It is elevated today.  We discussed medication adjustment which could include simply increasing lisinopril to 20 mg daily and combining the hydrochlorothiazide in a single tablet.  We also discussed blood pressure lowering by natural means: Significant reduction in alcohol intake, decrease sodium intake to less than or equal to 2000 mg/day, avoid nonsteroidal anti-inflammatory therapy, and moderate intensity aerobic exercise of at least 150 minutes/week.  She will monitor her blood pressure and contact us if she has consistent elevation above 130/80 mmHg.  Going forward she will report response to nitroglycerin to [place].  Clinical follow-up in 9 to 12 months.  Clinical follow-up prior to that time if worsening episodes of chest discomfort.  Further additional work-up may include coronary calcium score and/or coronary CT angiography.  If chest pain responsive to nitroglycerin catheter based coronary angiography would also be a consideration.  Greater than 40 minutes spent with the patient.  50% of the time was spent in education and counseling concerning her cardiovascular risk factors,  appropriate targets, and use of nitroglycerin as an empiric management strategy for recurring atypical chest pain potentially consistent with angina pectoris.  Medication Adjustments/Labs and Tests Ordered: Current medicines are reviewed at length with the patient today.  Concerns regarding medicines are outlined above.  No orders of the defined types were placed in this encounter.  Meds ordered this encounter  Medications  . nitroGLYCERIN (NITROSTAT) 0.4 MG SL tablet    Sig: Place 1 tablet (0.4 mg total) under the tongue every 5 (five) minutes as needed for chest pain.    Dispense:  25 tablet    Refill:  3    Patient Instructions  Medication Instructions:  1) A prescription for Nitroglycerin has been sent to your pharmacy for you.  If you develop chest pain, place one tablet under your tongue and allow to dissolve.  If no relief of symptoms after 5 minutes you may take another tablet.  Do not exceed more than 3 tablets.  If you take a third tablet and your symptoms have not resolved, please call 911.  Please be sure to make sure you are sitting or lying down when you use this medication.  Labwork: None  Testing/Procedures: None  Follow-Up: Your physician wants you to follow-up in: 1 year with Dr. Tamala Julian.  You will receive a reminder letter in the mail two months in advance. If you don't receive a letter, please call our office to schedule the follow-up appointment.   Any Other Special Instructions Will Be Listed Below (If Applicable).  Your goal blood pressure is 130/80 or lower.  If you are consistently higher than this, please contact the office.   If you need a refill on your cardiac medications before your next appointment, please call your pharmacy.      Signed, Sinclair Grooms, MD  05/02/2018 5:30 PM    [place]

## 2018-05-02 ENCOUNTER — Encounter: Payer: Self-pay | Admitting: Interventional Cardiology

## 2018-05-02 ENCOUNTER — Ambulatory Visit: Payer: Medicare HMO | Admitting: Interventional Cardiology

## 2018-05-02 VITALS — BP 144/72 | HR 72 | Ht 62.0 in | Wt 143.8 lb

## 2018-05-02 DIAGNOSIS — R0789 Other chest pain: Secondary | ICD-10-CM | POA: Diagnosis not present

## 2018-05-02 DIAGNOSIS — E781 Pure hyperglyceridemia: Secondary | ICD-10-CM

## 2018-05-02 DIAGNOSIS — I10 Essential (primary) hypertension: Secondary | ICD-10-CM | POA: Diagnosis not present

## 2018-05-02 MED ORDER — NITROGLYCERIN 0.4 MG SL SUBL: 0.4000 mg | SUBLINGUAL_TABLET | SUBLINGUAL | 3 refills | Status: DC | PRN

## 2018-05-02 NOTE — Patient Instructions (Signed)
Medication Instructions:  1) A prescription for Nitroglycerin has been sent to your pharmacy for you.  If you develop chest pain, place one tablet under your tongue and allow to dissolve.  If no relief of symptoms after 5 minutes you may take another tablet.  Do not exceed more than 3 tablets.  If you take a third tablet and your symptoms have not resolved, please call 911.  Please be sure to make sure you are sitting or lying down when you use this medication.  Labwork: None  Testing/Procedures: None  Follow-Up: Your physician wants you to follow-up in: 1 year with Dr. Tamala Julian.  You will receive a reminder letter in the mail two months in advance. If you don't receive a letter, please call our office to schedule the follow-up appointment.   Any Other Special Instructions Will Be Listed Below (If Applicable).  Your goal blood pressure is 130/80 or lower.  If you are consistently higher than this, please contact the office.   If you need a refill on your cardiac medications before your next appointment, please call your pharmacy.

## 2018-05-04 ENCOUNTER — Telehealth: Payer: Self-pay | Admitting: Obstetrics & Gynecology

## 2018-05-04 NOTE — Telephone Encounter (Signed)
Patient in office. Has printed RX for Vivelle Dot 0.025mg  Brand Only. Patient has been filling Rx through [facility] for 3-4 years, medication no longer available. Patient took [facility] printed [facility] to [facility] to [facility], was advised "insurance won't cover medication". Patient did not leave RX on file and is unsure what is needed. Patient has 2 wks of patches left. Patient request to fill Rx at [facility].   Advised I will review with Dr. Sabra Heck. Will likely need new RX to pharmacy, can then determine what is needed. Advised patient if PA is needed, this is submitted electronically, can take 5 business days for response. Advised patient will review with Dr. Sabra Heck and return call. Patient verbalizes understanding and is agreeable.   Dr. Sabra Heck -ok to send new Rx for Vivelle Dot to [facility]?

## 2018-05-04 NOTE — Telephone Encounter (Signed)
I would recommend she use [facility] for cost and to try the generic Estradiol patches.  They do work quite well.  Ok to send in rx as we discussed.

## 2018-05-04 NOTE — Telephone Encounter (Signed)
Patient stopped by the office requesting assistance with her medications for Vivelle-Dot.

## 2018-05-05 NOTE — Telephone Encounter (Signed)
Patient is returning a call to Jill. °

## 2018-05-05 NOTE — Telephone Encounter (Signed)
Left message to call Trenace Coughlin at [phone].  

## 2018-05-05 NOTE — Telephone Encounter (Signed)
Left message to call Othman Masur at [phone].  

## 2018-05-05 NOTE — Telephone Encounter (Signed)
Patient returned a call to Jill.   

## 2018-05-05 NOTE — Telephone Encounter (Signed)
Spoke with patient, advised per Dr. Sabra Heck. Patient states she will return call to office to advise how she would like to proceed. No Rx sent.

## 2018-05-08 DIAGNOSIS — I10 Essential (primary) hypertension: Secondary | ICD-10-CM | POA: Diagnosis not present

## 2018-05-08 DIAGNOSIS — Z0000 Encounter for general adult medical examination without abnormal findings: Secondary | ICD-10-CM | POA: Diagnosis not present

## 2018-05-08 DIAGNOSIS — E041 Nontoxic single thyroid nodule: Secondary | ICD-10-CM | POA: Diagnosis not present

## 2018-05-08 DIAGNOSIS — R82998 Other abnormal findings in urine: Secondary | ICD-10-CM | POA: Diagnosis not present

## 2018-05-08 DIAGNOSIS — M859 Disorder of bone density and structure, unspecified: Secondary | ICD-10-CM | POA: Diagnosis not present

## 2018-05-08 DIAGNOSIS — E7849 Other hyperlipidemia: Secondary | ICD-10-CM | POA: Diagnosis not present

## 2018-05-09 NOTE — Telephone Encounter (Signed)
Patient has not returned call.   Routing to Dr. Sabra Heck.  Encounter closed.

## 2018-05-10 DIAGNOSIS — M25562 Pain in left knee: Secondary | ICD-10-CM | POA: Diagnosis not present

## 2018-05-10 DIAGNOSIS — M1712 Unilateral primary osteoarthritis, left knee: Secondary | ICD-10-CM | POA: Diagnosis not present

## 2018-05-11 ENCOUNTER — Telehealth: Payer: Self-pay | Admitting: Obstetrics & Gynecology

## 2018-05-11 NOTE — Telephone Encounter (Signed)
Patient states she discussed medication with a nurse last week and would like to discuss it further.

## 2018-05-11 NOTE — Telephone Encounter (Signed)
Spoke with patient. Called to provide update. Does not need new Rx for Vivelle-dot 0.025mg . Patient thankful for assistance. See previous telephone encounter dated 05/04/18.   Routing to Dr. Sabra Heck. Encounter closed.

## 2018-05-15 DIAGNOSIS — R0789 Other chest pain: Secondary | ICD-10-CM | POA: Diagnosis not present

## 2018-05-15 DIAGNOSIS — E7849 Other hyperlipidemia: Secondary | ICD-10-CM | POA: Diagnosis not present

## 2018-05-15 DIAGNOSIS — I10 Essential (primary) hypertension: Secondary | ICD-10-CM | POA: Diagnosis not present

## 2018-05-15 DIAGNOSIS — E559 Vitamin D deficiency, unspecified: Secondary | ICD-10-CM | POA: Diagnosis not present

## 2018-05-15 DIAGNOSIS — G5600 Carpal tunnel syndrome, unspecified upper limb: Secondary | ICD-10-CM | POA: Diagnosis not present

## 2018-05-15 DIAGNOSIS — R69 Illness, unspecified: Secondary | ICD-10-CM | POA: Diagnosis not present

## 2018-05-15 DIAGNOSIS — Z0000 Encounter for general adult medical examination without abnormal findings: Secondary | ICD-10-CM | POA: Diagnosis not present

## 2018-05-15 DIAGNOSIS — R234 Changes in skin texture: Secondary | ICD-10-CM | POA: Diagnosis not present

## 2018-05-15 DIAGNOSIS — E663 Overweight: Secondary | ICD-10-CM | POA: Diagnosis not present

## 2018-05-15 DIAGNOSIS — M859 Disorder of bone density and structure, unspecified: Secondary | ICD-10-CM | POA: Diagnosis not present

## 2018-05-20 DIAGNOSIS — M79601 Pain in right arm: Secondary | ICD-10-CM | POA: Diagnosis not present

## 2018-05-24 DIAGNOSIS — M1712 Unilateral primary osteoarthritis, left knee: Secondary | ICD-10-CM | POA: Diagnosis not present

## 2018-06-02 ENCOUNTER — Telehealth: Payer: Self-pay

## 2018-06-02 NOTE — Telephone Encounter (Signed)
PA started for Vivelle Dot. Waiting response

## 2018-06-07 DIAGNOSIS — D1801 Hemangioma of skin and subcutaneous tissue: Secondary | ICD-10-CM | POA: Diagnosis not present

## 2018-06-07 DIAGNOSIS — D225 Melanocytic nevi of trunk: Secondary | ICD-10-CM | POA: Diagnosis not present

## 2018-06-07 DIAGNOSIS — L821 Other seborrheic keratosis: Secondary | ICD-10-CM | POA: Diagnosis not present

## 2018-06-07 DIAGNOSIS — I788 Other diseases of capillaries: Secondary | ICD-10-CM | POA: Diagnosis not present

## 2018-06-07 DIAGNOSIS — D692 Other nonthrombocytopenic purpura: Secondary | ICD-10-CM | POA: Diagnosis not present

## 2018-06-07 DIAGNOSIS — L814 Other melanin hyperpigmentation: Secondary | ICD-10-CM | POA: Diagnosis not present

## 2018-06-07 DIAGNOSIS — L700 Acne vulgaris: Secondary | ICD-10-CM | POA: Diagnosis not present

## 2018-06-07 DIAGNOSIS — L570 Actinic keratosis: Secondary | ICD-10-CM | POA: Diagnosis not present

## 2018-06-07 NOTE — Telephone Encounter (Signed)
PA approved and patient was notified.

## 2018-06-27 DIAGNOSIS — M179 Osteoarthritis of knee, unspecified: Secondary | ICD-10-CM | POA: Insufficient documentation

## 2018-06-27 DIAGNOSIS — M1710 Unilateral primary osteoarthritis, unspecified knee: Secondary | ICD-10-CM | POA: Insufficient documentation

## 2018-06-27 DIAGNOSIS — M1712 Unilateral primary osteoarthritis, left knee: Secondary | ICD-10-CM | POA: Diagnosis not present

## 2018-09-01 DIAGNOSIS — G5602 Carpal tunnel syndrome, left upper limb: Secondary | ICD-10-CM | POA: Diagnosis not present

## 2018-09-14 DIAGNOSIS — G5602 Carpal tunnel syndrome, left upper limb: Secondary | ICD-10-CM | POA: Diagnosis not present

## 2018-09-18 DIAGNOSIS — R05 Cough: Secondary | ICD-10-CM | POA: Diagnosis not present

## 2018-10-05 DIAGNOSIS — G5602 Carpal tunnel syndrome, left upper limb: Secondary | ICD-10-CM | POA: Diagnosis not present

## 2018-10-12 DIAGNOSIS — R69 Illness, unspecified: Secondary | ICD-10-CM | POA: Diagnosis not present

## 2018-10-30 DIAGNOSIS — R69 Illness, unspecified: Secondary | ICD-10-CM | POA: Diagnosis not present

## 2018-11-13 DIAGNOSIS — R5383 Other fatigue: Secondary | ICD-10-CM | POA: Diagnosis not present

## 2018-11-13 DIAGNOSIS — E663 Overweight: Secondary | ICD-10-CM | POA: Diagnosis not present

## 2018-11-13 DIAGNOSIS — K219 Gastro-esophageal reflux disease without esophagitis: Secondary | ICD-10-CM | POA: Diagnosis not present

## 2018-11-13 DIAGNOSIS — R040 Epistaxis: Secondary | ICD-10-CM | POA: Diagnosis not present

## 2018-11-13 DIAGNOSIS — G5600 Carpal tunnel syndrome, unspecified upper limb: Secondary | ICD-10-CM | POA: Diagnosis not present

## 2018-11-13 DIAGNOSIS — R05 Cough: Secondary | ICD-10-CM | POA: Diagnosis not present

## 2018-11-13 DIAGNOSIS — I10 Essential (primary) hypertension: Secondary | ICD-10-CM | POA: Diagnosis not present

## 2018-11-13 DIAGNOSIS — R69 Illness, unspecified: Secondary | ICD-10-CM | POA: Diagnosis not present

## 2019-01-24 ENCOUNTER — Other Ambulatory Visit: Payer: Self-pay | Admitting: Obstetrics & Gynecology

## 2019-01-24 NOTE — Telephone Encounter (Signed)
Medication refill request: Prometrium 100mg   Last AEX:  01/20/18 Next AEX: 05/04/19 Last MMG (if hormonal medication request): 02/01/18 birads 1 neg  Refill authorized: #90 with 0 refills to get her AEX.

## 2019-02-08 DIAGNOSIS — R69 Illness, unspecified: Secondary | ICD-10-CM | POA: Diagnosis not present

## 2019-02-24 ENCOUNTER — Other Ambulatory Visit: Payer: Self-pay | Admitting: Obstetrics & Gynecology

## 2019-03-01 ENCOUNTER — Telehealth: Payer: Self-pay | Admitting: Obstetrics & Gynecology

## 2019-03-01 NOTE — Telephone Encounter (Signed)
Prometrium 100 mg caps sent on 01/25/19 #90/0RF.   Call [facility] to [facility] at [facility]. Was advised RX on file, patient had Prometrium filled on 6/19 for 30 day supply, has 2 refills remaining. Out pf pocket cost $100 for 30 day supply.   Call returned to patient, advised as seen above. Patient verbalizes understanding and is agreeable.  Routing to provider for final review. Patient is agreeable to disposition. Will close encounter.

## 2019-03-01 NOTE — Telephone Encounter (Signed)
Patient calling to check on why her prescription for prometrium was not approved.

## 2019-03-06 DIAGNOSIS — R69 Illness, unspecified: Secondary | ICD-10-CM | POA: Diagnosis not present

## 2019-03-06 DIAGNOSIS — R42 Dizziness and giddiness: Secondary | ICD-10-CM | POA: Diagnosis not present

## 2019-03-06 DIAGNOSIS — I10 Essential (primary) hypertension: Secondary | ICD-10-CM | POA: Diagnosis not present

## 2019-03-06 DIAGNOSIS — R5383 Other fatigue: Secondary | ICD-10-CM | POA: Diagnosis not present

## 2019-03-06 DIAGNOSIS — Z7689 Persons encountering health services in other specified circumstances: Secondary | ICD-10-CM | POA: Diagnosis not present

## 2019-03-10 DIAGNOSIS — Z1159 Encounter for screening for other viral diseases: Secondary | ICD-10-CM | POA: Diagnosis not present

## 2019-03-13 DIAGNOSIS — K219 Gastro-esophageal reflux disease without esophagitis: Secondary | ICD-10-CM | POA: Diagnosis not present

## 2019-03-13 DIAGNOSIS — R1310 Dysphagia, unspecified: Secondary | ICD-10-CM | POA: Diagnosis not present

## 2019-03-13 DIAGNOSIS — K222 Esophageal obstruction: Secondary | ICD-10-CM | POA: Diagnosis not present

## 2019-03-13 DIAGNOSIS — K449 Diaphragmatic hernia without obstruction or gangrene: Secondary | ICD-10-CM | POA: Diagnosis not present

## 2019-03-13 DIAGNOSIS — I10 Essential (primary) hypertension: Secondary | ICD-10-CM | POA: Diagnosis not present

## 2019-03-13 DIAGNOSIS — Z880 Allergy status to penicillin: Secondary | ICD-10-CM | POA: Diagnosis not present

## 2019-04-09 ENCOUNTER — Other Ambulatory Visit: Payer: Self-pay | Admitting: Obstetrics & Gynecology

## 2019-04-09 NOTE — Telephone Encounter (Signed)
Medication refill request: Prometrium Last AEX:  01/20/2018 SM Next AEX: 05/04/2019 Last MMG (if hormonal medication request): 02/01/2018 BIRADS 1 Negative Density B Refill authorized: Pending authorization. #30 with no refills if appropriate. Please advise.

## 2019-04-10 ENCOUNTER — Other Ambulatory Visit: Payer: Self-pay | Admitting: Obstetrics & Gynecology

## 2019-04-10 DIAGNOSIS — Z1231 Encounter for screening mammogram for malignant neoplasm of breast: Secondary | ICD-10-CM

## 2019-04-19 ENCOUNTER — Other Ambulatory Visit: Payer: Self-pay | Admitting: Obstetrics & Gynecology

## 2019-04-19 NOTE — Telephone Encounter (Signed)
Patient dropped off a request for 1 refill (3 month ) supply of Vivelle Dot to [email or web address]  Fax [phone]  Ph: [phone]  Request placed in refill in box..  She asked for someone to call her when this has been completed.

## 2019-04-19 NOTE — Telephone Encounter (Signed)
Medication refill request: Vivelle-Dot Last AEX:  01/20/18 SM Next AEX: 05/04/19  Last MMG (if hormonal medication request): 02/01/18 BIRADS 1 negative/density b -- scheduled 05/18/19 Refill authorized: Please advise; if authorized prescription printed to be faxed to [facility]

## 2019-04-22 MED ORDER — ESTRADIOL 0.025 MG/24HR TD PTTW: 1.0000 | MEDICATED_PATCH | TRANSDERMAL | 4 refills | Status: DC

## 2019-04-22 NOTE — Telephone Encounter (Signed)
I'm routing this to Glorianne Manchester to call pt.  I can sign and print rx for pt.  Where she decides to get it filled is her decision but we cannot fax it to [place] for her.  I'm sorry.

## 2019-04-23 ENCOUNTER — Other Ambulatory Visit: Payer: Self-pay | Admitting: Obstetrics & Gynecology

## 2019-04-24 DIAGNOSIS — R69 Illness, unspecified: Secondary | ICD-10-CM | POA: Diagnosis not present

## 2019-04-24 NOTE — Telephone Encounter (Signed)
Call returned to patient, no answer, mailbox full, unable to leave message.   Rx is on my desk.

## 2019-04-24 NOTE — Telephone Encounter (Signed)
Medication refill request: Prometrium Last AEX:  01/20/2018 SM Next AEX: 05/04/2019 Last MMG (if hormonal medication request): 02/01/2018 BIRADS 1 Negative Density B Refill authorized: Pharmacy is requesting 90 day supply. Pending authorization. #[id number] with no refills if appropriate. Please advise.

## 2019-05-03 ENCOUNTER — Other Ambulatory Visit: Payer: Self-pay

## 2019-05-04 ENCOUNTER — Encounter

## 2019-05-04 ENCOUNTER — Telehealth: Payer: Self-pay | Admitting: Obstetrics & Gynecology

## 2019-05-04 ENCOUNTER — Ambulatory Visit: Payer: Medicare HMO | Admitting: Obstetrics & Gynecology

## 2019-05-04 NOTE — Telephone Encounter (Signed)
Please see refill encounter 04/19/19. Closing encounter.

## 2019-05-04 NOTE — Progress Notes (Deleted)
83 y.o. G76P1011 Divorced White or Caucasian female here for annual exam.    Patient's last menstrual period was 08/16/1998.          Sexually active: {yes no:314532}  The current method of family planning is post menopausal status.    Exercising: {yes no:314532}  {types:19826} Smoker:  {YES P5382123  Health Maintenance: Pap:  11/11/15 neg   10/29/13 neg  History of abnormal Pap:  Yes, LEEP, CIN 1 MMG:  02/01/18 BIRADS1:neg. Has appt 05/18/19 Colonoscopy:  2012 f/u 10 years  BMD:   07/29/16 Osteopenia  TDaP:  PCP Pneumonia vaccine(s):  PCP Shingrix:   Completed  Hep C testing: PCP Screening Labs: PCP   reports that she quit smoking about 56 years ago. She has never used smokeless tobacco. She reports current alcohol use of about 5.0 standard drinks of alcohol per week. She reports that she does not use drugs.  Past Medical History:  Diagnosis Date  . Anemia    secondary to taking omeprazole-on iron  . Diverticulosis 2/04  . GERD (gastroesophageal reflux disease)   . Hyperlipidemia   . Osteopenia   . Post traumatic stress disorder   . STD (sexually transmitted disease)    HSV    Past Surgical History:  Procedure Laterality Date  . APPENDECTOMY    . CERVICAL BIOPSY  W/ LOOP ELECTRODE EXCISION     CIN1  . COLPOSCOPY    . ESOPHAGEAL DILATION  2013  . FACIAL COSMETIC SURGERY  9/03  . KNEE SURGERY    . TONSILLECTOMY      Current Outpatient Medications  Medication Sig Dispense Refill  . CALCIUM PO Take 600 mg by mouth 2 (two) times daily.     . cetirizine (ZYRTEC) 10 MG tablet Take 1 tablet by mouth 2 (two) times a week.     . Cholecalciferol (VITAMIN D PO) Take 2,500 mg by mouth daily.     . clindamycin (CLEOCIN T) 1 % external solution Apply 1 application topically 2 (two) times daily as needed (ACNE).     . Coenzyme Q10 (CO Q 10 PO) Take by mouth daily.    Marland Kitchen EPIPEN 2-PAK 0.3 MG/0.3ML SOAJ injection     . estradiol (VIVELLE-DOT) 0.025 MG/24HR Place 1 patch onto the skin  2 (two) times a week. 24 patch 4  . folic acid (FOLVITE) 778 MCG tablet Take 400 mcg by mouth daily.    . hydrochlorothiazide (MICROZIDE) 12.5 MG capsule Take 12.5 mg by mouth daily.     Marland Kitchen LATISSE 0.03 % ophthalmic solution APPLY TO UPPER EYE LASHES AT BEDTIME AS NEEDED  5  . lisinopril (PRINIVIL,ZESTRIL) 10 MG tablet Take 10 mg by mouth daily.    . Magnesium 300 MG CAPS Take 300 mg by mouth daily.    . Multiple Vitamins-Minerals (MULTIVITAMIN WITH MINERALS) tablet Take 1 tablet by mouth daily. Reported on 11/11/2015    . nitroGLYCERIN (NITROSTAT) 0.4 MG SL tablet Place 1 tablet (0.4 mg total) under the tongue every 5 (five) minutes as needed for chest pain. 25 tablet 3  . Omega-3 Fatty Acids (FISH OIL PO) Take 600 mg by mouth 2 (two) times daily.     Marland Kitchen omeprazole (PRILOSEC) 10 MG capsule Take 10 mg by mouth daily.  11  . PROMETRIUM 100 MG capsule TAKE 1 CAPSULE BY MOUTH EVERY DAY 90 capsule 0  . tretinoin (RETIN-A) 0.025 % cream Apply 1 application topically at bedtime.      No current facility-administered medications for  this visit.     Family History  Problem Relation Age of Onset  . COPD Mother   . Sudden death Father 62  . Healthy Brother     Review of Systems  Exam:   LMP 08/16/1998   Height:      Ht Readings from Last 3 Encounters:  05/02/18 5\' 2"  (1.575 m)  01/20/18 5' 0.5" (1.537 m)  01/19/18 5\' 1"  (1.549 m)    General appearance: alert, cooperative and appears stated age Head: Normocephalic, without obvious abnormality, atraumatic Neck: no adenopathy, supple, symmetrical, trachea midline and thyroid {EXAM; THYROID:18604} Lungs: clear to auscultation bilaterally Breasts: {Exam; breast:13139::"normal appearance, no masses or tenderness"} Heart: regular rate and rhythm Abdomen: soft, non-tender; bowel sounds normal; no masses,  no organomegaly Extremities: extremities normal, atraumatic, no cyanosis or edema Skin: Skin color, texture, turgor normal. No rashes or  lesions Lymph nodes: Cervical, supraclavicular, and axillary nodes normal. No abnormal inguinal nodes palpated Neurologic: Grossly normal   Pelvic: External genitalia:  no lesions              Urethra:  normal appearing urethra with no masses, tenderness or lesions              Bartholins and Skenes: normal                 Vagina: normal appearing vagina with normal color and discharge, no lesions              Cervix: {exam; cervix:14595}              Pap taken: {yes no:314532} Bimanual Exam:  Uterus:  {exam; uterus:12215}              Adnexa: {exam; adnexa:12223}               Rectovaginal: Confirms               Anus:  normal sphincter tone, no lesions  Chaperone was present for exam.  A:  Well Woman with normal exam  P:   {plan; gyn:5269::"mammogram","pap smear","return annually or prn"}

## 2019-05-04 NOTE — Telephone Encounter (Signed)
Spoke with patient. Rx placed at front office for pick up.

## 2019-05-04 NOTE — Telephone Encounter (Signed)
Patient did not arrive on time for appointment today, rescheduled to 05/23/19. She needs refill on Vivelle-dot before her next appointment.

## 2019-05-09 ENCOUNTER — Other Ambulatory Visit: Payer: Self-pay | Admitting: Obstetrics & Gynecology

## 2019-05-09 NOTE — Telephone Encounter (Signed)
Can you confirm with pt about this rx?  I thought she got this from outside the [place] and rx was recently done for her for the vivelle dot.  Thanks.

## 2019-05-10 NOTE — Telephone Encounter (Signed)
Marissa Jacobs had patient come picked up Rx 05/04/19.

## 2019-05-18 ENCOUNTER — Other Ambulatory Visit: Payer: Self-pay

## 2019-05-18 ENCOUNTER — Ambulatory Visit
Admission: RE | Admit: 2019-05-18 | Discharge: 2019-05-18 | Disposition: A | Discharge status: Home / Self Care | Payer: Medicare HMO | Source: Ambulatory Visit | Attending: Obstetrics & Gynecology | Admitting: Obstetrics & Gynecology

## 2019-05-18 DIAGNOSIS — Z1231 Encounter for screening mammogram for malignant neoplasm of breast: Secondary | ICD-10-CM

## 2019-05-21 DIAGNOSIS — E7849 Other hyperlipidemia: Secondary | ICD-10-CM | POA: Diagnosis not present

## 2019-05-21 DIAGNOSIS — E559 Vitamin D deficiency, unspecified: Secondary | ICD-10-CM | POA: Diagnosis not present

## 2019-05-23 ENCOUNTER — Other Ambulatory Visit: Payer: Self-pay

## 2019-05-23 ENCOUNTER — Other Ambulatory Visit (HOSPITAL_COMMUNITY)
Admission: RE | Admit: 2019-05-23 | Discharge: 2019-05-23 | Disposition: A | Discharge status: Home / Self Care | Payer: Medicare HMO | Source: Ambulatory Visit | Attending: Obstetrics & Gynecology | Admitting: Obstetrics & Gynecology

## 2019-05-23 ENCOUNTER — Encounter: Payer: Self-pay | Admitting: Obstetrics & Gynecology

## 2019-05-23 ENCOUNTER — Ambulatory Visit (INDEPENDENT_AMBULATORY_CARE_PROVIDER_SITE_OTHER): Payer: Medicare HMO | Admitting: Obstetrics & Gynecology

## 2019-05-23 VITALS — BP 142/70 | HR 80 | Temp 97.6°F | Resp 12 | Ht 60.75 in | Wt 141.8 lb

## 2019-05-23 DIAGNOSIS — Z124 Encounter for screening for malignant neoplasm of cervix: Secondary | ICD-10-CM

## 2019-05-23 DIAGNOSIS — Z01419 Encounter for gynecological examination (general) (routine) without abnormal findings: Secondary | ICD-10-CM

## 2019-05-23 DIAGNOSIS — R040 Epistaxis: Secondary | ICD-10-CM | POA: Diagnosis not present

## 2019-05-23 MED ORDER — PROMETRIUM 100 MG PO CAPS: 100.0000 mg | ORAL_CAPSULE | Freq: Every day | ORAL | 4 refills | Status: DC

## 2019-05-23 NOTE — Progress Notes (Addendum)
83 y.o. G20P1011 Divorced White or Caucasian female here for annual exam.  Denies vaginal bleeding.  She has a first cousin that was diagnosed with endometrial cancer off of her pap smear.  Pt desires to have a Pap smear.    She declines flu shot today.  Had a reaction in the 1960's and declined ever since then.  She did blood work last week.    Does have intermittent nose bleeds.  She will have two or three a week and then not have one for months.  Would like ENT evaluation.    Patient's last menstrual period was 08/16/1998.          Sexually active: Yes.    The current method of family planning is post menopausal status.    Exercising: No.  The patient does not participate in regular exercise at present. Smoker:  Former smoker  Health Maintenance: Pap:  11/11/15 Negative History of abnormal Pap:  Yes, LEEP MMG:  05/18/19 BIRADS 1 negative/density b Colonoscopy:  12/2010 f/u 10 years BMD:   07/29/16 Ostoepenia TDaP: unsure -- will discuss with PCP Monday at physical Pneumonia vaccine(s):  Updated with PCP Shingrix:   05/11/18, 08/23/18 Hep C testing: not indicated Screening Labs: Dr. Virgina Jock   reports that she quit smoking about 56 years ago. She has never used smokeless tobacco. She reports current alcohol use of about 5.0 standard drinks of alcohol per week. She reports that she does not use drugs.  Past Medical History:  Diagnosis Date  . Anemia    secondary to taking omeprazole-on iron  . Diverticulosis 2/04  . GERD (gastroesophageal reflux disease)   . Hyperlipidemia   . Osteopenia   . Post traumatic stress disorder   . STD (sexually transmitted disease)    HSV    Past Surgical History:  Procedure Laterality Date  . APPENDECTOMY    . CERVICAL BIOPSY  W/ LOOP ELECTRODE EXCISION     CIN1  . COLPOSCOPY    . ESOPHAGEAL DILATION  2013  . FACIAL COSMETIC SURGERY  9/03  . KNEE SURGERY    . TONSILLECTOMY      Current Outpatient Medications  Medication Sig Dispense Refill   . CALCIUM PO Take 600 mg by mouth 2 (two) times daily.     . Cholecalciferol (VITAMIN D PO) Take 2,500 mg by mouth daily.     . Coenzyme Q10 (CO Q 10 PO) Take by mouth daily.    Marland Kitchen EPIPEN 2-PAK 0.3 MG/0.3ML SOAJ injection     . estradiol (VIVELLE-DOT) 0.025 MG/24HR Place 1 patch onto the skin 2 (two) times a week. 24 patch 4  . fexofenadine (ALLEGRA) 180 MG tablet Take 180 mg by mouth once a week.    . folic acid (FOLVITE) 132 MCG tablet Take 400 mcg by mouth daily.    . hydrochlorothiazide (MICROZIDE) 12.5 MG capsule Take 12.5 mg by mouth daily.     Marland Kitchen lisinopril (PRINIVIL,ZESTRIL) 10 MG tablet Take 10 mg by mouth daily.    . Magnesium 300 MG CAPS Take 300 mg by mouth daily.    . Multiple Vitamins-Minerals (MULTIVITAMIN WITH MINERALS) tablet Take 1 tablet by mouth daily. Reported on 11/11/2015    . Omega-3 Fatty Acids (FISH OIL PO) Take 600 mg by mouth 2 (two) times daily.     Marland Kitchen omeprazole (PRILOSEC) 10 MG capsule Take 10 mg by mouth daily.  11  . PROMETRIUM 100 MG capsule TAKE 1 CAPSULE BY MOUTH EVERY DAY 90 capsule 0  .  tretinoin (RETIN-A) 0.025 % cream Apply 1 application topically at bedtime.     . nitroGLYCERIN (NITROSTAT) 0.4 MG SL tablet Place 1 tablet (0.4 mg total) under the tongue every 5 (five) minutes as needed for chest pain. 25 tablet 3   No current facility-administered medications for this visit.     Family History  Problem Relation Age of Onset  . COPD Mother   . Sudden death Father 60  . Healthy Brother     Review of Systems  Constitutional: Negative.   HENT: Negative.   Eyes: Negative.   Respiratory: Negative.   Cardiovascular: Negative.   Gastrointestinal: Negative.   Endocrine: Negative.   Genitourinary: Negative.   Musculoskeletal: Negative.   Skin: Negative.   Allergic/Immunologic: Negative.   Neurological: Negative.   Hematological: Negative.   Psychiatric/Behavioral: Negative.     Exam:   BP (!) 142/70 (BP Location: Right Arm, Patient Position:  Sitting, Cuff Size: Normal)   Pulse 80   Temp 97.6 F (36.4 C) (Temporal)   Resp 12   Ht 5' 0.75" (1.543 m)   Wt 141 lb 12.8 oz (64.3 kg)   LMP 08/16/1998   BMI 27.01 kg/m    Height: 5' 0.75" (154.3 cm)  Ht Readings from Last 3 Encounters:  05/23/19 5' 0.75" (1.543 m)  05/02/18 5\' 2"  (1.575 m)  01/20/18 5' 0.5" (1.537 m)    General appearance: alert, cooperative and appears stated age Head: Normocephalic, without obvious abnormality, atraumatic Neck: no adenopathy, supple, symmetrical, trachea midline and thyroid normal to inspection and palpation Lungs: clear to auscultation bilaterally Breasts: normal appearance, no masses or tenderness Heart: regular rate and rhythm Abdomen: soft, non-tender; bowel sounds normal; no masses,  no organomegaly Extremities: extremities normal, atraumatic, no cyanosis or edema Skin: Skin color, texture, turgor normal. No rashes or lesions Lymph nodes: Cervical, supraclavicular, and axillary nodes normal. No abnormal inguinal nodes palpated Neurologic: Grossly normal   Pelvic: External genitalia:  no lesions              Urethra:  normal appearing urethra with no masses, tenderness or lesions              Bartholins and Skenes: normal                 Vagina: normal appearing vagina with normal color and discharge, no lesions              Cervix: no lesions              Pap taken: Yes.   Bimanual Exam:  Uterus:  normal size, contour, position, consistency, mobility, non-tender              Adnexa: normal adnexa and no mass, fullness, tenderness               Rectovaginal: Confirms               Anus:  normal sphincter tone, no lesions  Chaperone was present for exam.  A:  Well Woman with normal exam PMP, no HRT Osteopenia GERD H/o HSV without recent outbreak OAB with mild SUI Intermittent, right sided, epistaxis  P:   Mammogram guidelines reviewed.   Plan BMD next year pap smear obtained today per pt request Has Vivelle dot 0.025mg   patches twice weekly.  Does not need rx.  She is clearly aware of risks of DVT, PE, stroke, MI.  Absolutely does not want to stop and desires to continue.  Highly recommend  pt consider cutting patch in half to decrease estrogen risks. Prometrium 100mg  daily.  #30/12RF Return annually or prn

## 2019-05-23 NOTE — Patient Instructions (Addendum)
Please check with Dr. Virgina Jock about your tetanus and if you'll let me know when it was done, I will add it to your records.    Melida Quitter, [facility]

## 2019-05-28 DIAGNOSIS — K219 Gastro-esophageal reflux disease without esophagitis: Secondary | ICD-10-CM | POA: Diagnosis not present

## 2019-05-28 DIAGNOSIS — I10 Essential (primary) hypertension: Secondary | ICD-10-CM | POA: Diagnosis not present

## 2019-05-28 DIAGNOSIS — Z0000 Encounter for general adult medical examination without abnormal findings: Secondary | ICD-10-CM | POA: Diagnosis not present

## 2019-05-28 DIAGNOSIS — Z1212 Encounter for screening for malignant neoplasm of rectum: Secondary | ICD-10-CM | POA: Diagnosis not present

## 2019-05-28 DIAGNOSIS — L509 Urticaria, unspecified: Secondary | ICD-10-CM | POA: Diagnosis not present

## 2019-05-28 DIAGNOSIS — G4700 Insomnia, unspecified: Secondary | ICD-10-CM | POA: Diagnosis not present

## 2019-05-28 DIAGNOSIS — R42 Dizziness and giddiness: Secondary | ICD-10-CM | POA: Diagnosis not present

## 2019-05-28 DIAGNOSIS — R0789 Other chest pain: Secondary | ICD-10-CM | POA: Diagnosis not present

## 2019-05-28 DIAGNOSIS — R040 Epistaxis: Secondary | ICD-10-CM | POA: Diagnosis not present

## 2019-05-28 DIAGNOSIS — Z1331 Encounter for screening for depression: Secondary | ICD-10-CM | POA: Diagnosis not present

## 2019-05-28 DIAGNOSIS — R82998 Other abnormal findings in urine: Secondary | ICD-10-CM | POA: Diagnosis not present

## 2019-05-28 DIAGNOSIS — E785 Hyperlipidemia, unspecified: Secondary | ICD-10-CM | POA: Diagnosis not present

## 2019-05-28 DIAGNOSIS — Z79890 Hormone replacement therapy (postmenopausal): Secondary | ICD-10-CM | POA: Diagnosis not present

## 2019-05-28 LAB — IFOBT (OCCULT BLOOD): IFOBT: POSITIVE

## 2019-05-28 LAB — CYTOLOGY - PAP: Diagnosis: NEGATIVE

## 2019-05-29 DIAGNOSIS — H93293 Other abnormal auditory perceptions, bilateral: Secondary | ICD-10-CM | POA: Diagnosis not present

## 2019-05-29 DIAGNOSIS — Z7289 Other problems related to lifestyle: Secondary | ICD-10-CM | POA: Diagnosis not present

## 2019-05-29 DIAGNOSIS — Z9089 Acquired absence of other organs: Secondary | ICD-10-CM | POA: Diagnosis not present

## 2019-05-29 DIAGNOSIS — J343 Hypertrophy of nasal turbinates: Secondary | ICD-10-CM | POA: Diagnosis not present

## 2019-05-29 DIAGNOSIS — R040 Epistaxis: Secondary | ICD-10-CM | POA: Diagnosis not present

## 2019-05-29 DIAGNOSIS — H9193 Unspecified hearing loss, bilateral: Secondary | ICD-10-CM | POA: Insufficient documentation

## 2019-06-04 DIAGNOSIS — Z1212 Encounter for screening for malignant neoplasm of rectum: Secondary | ICD-10-CM | POA: Diagnosis not present

## 2019-06-07 DIAGNOSIS — H903 Sensorineural hearing loss, bilateral: Secondary | ICD-10-CM | POA: Diagnosis not present

## 2019-06-18 DIAGNOSIS — Z961 Presence of intraocular lens: Secondary | ICD-10-CM | POA: Diagnosis not present

## 2019-06-24 NOTE — Progress Notes (Signed)
Cardiology Office Note:    Date:  06/25/2019   ID:  KOBI ALLER, DOB [birth year], MRN [id number]  PCP:  Shon Baton, MD  Cardiologist:  No primary care provider on file.   Referring MD: Shon Baton, MD   Chief Complaint  Patient presents with  . Coronary Artery Disease  . Congestive Heart Failure    History of Present Illness:    Marissa Jacobs is a 83 y.o. female with a hx of recurring chest discomfort and transient EKG abnormality.   She has episodes of chest tightness that can occur spontaneously.  Episodes last 10 to 20 minutes.  No radiation to the jaw or to the neck.  Location is midsternal.  She is not having as many episodes now as she once did over 2 years ago when they first started occurring.  Myocardial perfusion imaging x2 most recently in 2019 was negative for evidence of ischemia.  She is relatively active but does have dyspnea on exertion.  She denies orthopnea, PND, and edema.  Past Medical History:  Diagnosis Date  . Anemia    secondary to taking omeprazole-on iron  . Diverticulosis 2/04  . GERD (gastroesophageal reflux disease)   . Hyperlipidemia   . Osteopenia   . Post traumatic stress disorder   . STD (sexually transmitted disease)    HSV    Past Surgical History:  Procedure Laterality Date  . APPENDECTOMY    . CERVICAL BIOPSY  W/ LOOP ELECTRODE EXCISION     CIN1  . COLPOSCOPY    . ESOPHAGEAL DILATION  2013  . FACIAL COSMETIC SURGERY  9/03  . KNEE SURGERY    . TONSILLECTOMY      Current Medications: Current Meds  Medication Sig  . CALCIUM PO Take 600 mg by mouth 2 (two) times daily.   . Cholecalciferol (VITAMIN D PO) Take 2,500 mg by mouth every other day.   . Coenzyme Q10 (CO Q 10 PO) Take by mouth daily.  Marland Kitchen EPIPEN 2-PAK 0.3 MG/0.3ML SOAJ injection   . estradiol (VIVELLE-DOT) 0.025 MG/24HR Place 1 patch onto the skin 2 (two) times a week.  . fexofenadine (ALLEGRA) 180 MG tablet Take 180 mg by mouth once a week.  . folic acid (FOLVITE)  973 MCG tablet Take 400 mcg by mouth daily.  . hydrochlorothiazide (MICROZIDE) 12.5 MG capsule Take 12.5 mg by mouth daily.   Marland Kitchen lisinopril (PRINIVIL,ZESTRIL) 10 MG tablet Take 10 mg by mouth daily.  . Magnesium 300 MG CAPS Take 300 mg by mouth daily.  . Multiple Vitamins-Minerals (MULTIVITAMIN WITH MINERALS) tablet Take 1 tablet by mouth daily. Reported on 11/11/2015  . Omega-3 Fatty Acids (FISH OIL PO) Take 600 mg by mouth daily.   Marland Kitchen omeprazole (PRILOSEC) 10 MG capsule Take 10 mg by mouth daily.  Marland Kitchen PROMETRIUM 100 MG capsule Take 1 capsule (100 mg total) by mouth daily.  Marland Kitchen tretinoin (RETIN-A) 0.025 % cream Apply 1 application topically at bedtime.      Allergies:   Abobotulinumtoxina, Codeine, and Amoxicillin   Social History   Socioeconomic History  . Marital status: Divorced    Spouse name: Not on file  . Number of children: Not on file  . Years of education: Not on file  . Highest education level: Not on file  Occupational History  . Not on file  Social Needs  . Financial resource strain: Not on file  . Food insecurity    Worry: Not on file    Inability: Not  on file  . Transportation needs    Medical: Not on file    Non-medical: Not on file  Tobacco Use  . Smoking status: Former Smoker    Quit date: 02/21/1963    Years since quitting: 56.3  . Smokeless tobacco: Never Used  . Tobacco comment: in college  Substance and Sexual Activity  . Alcohol use: Yes    Alcohol/week: 5.0 standard drinks    Types: 5 Standard drinks or equivalent per week  . Drug use: No  . Sexual activity: Yes    Partners: Male    Birth control/protection: Post-menopausal  Lifestyle  . Physical activity    Days per week: Not on file    Minutes per session: Not on file  . Stress: Not on file  Relationships  . Social Herbalist on phone: Not on file    Gets together: Not on file    Attends religious service: Not on file    Active member of club or organization: Not on file    Attends  meetings of clubs or organizations: Not on file    Relationship status: Not on file  Other Topics Concern  . Not on file  Social History Narrative  . Not on file     Family History: The patient's family history includes COPD in her mother; Healthy in her brother; Sudden death (age of onset: 40) in her father.  ROS:   Please see the history of present illness.    She is concerned about having a stroke or a vascular event.  All other systems reviewed and are negative.  EKGs/Labs/Other Studies Reviewed:    The following studies were reviewed today: NUCLEAR STRESS TEST 2019: Study Highlights   The left ventricular ejection fraction is hyperdynamic (>65%).  Nuclear stress EF: 89%.  The study is normal.  There was less than 16mm of horizontal to upsloping ST segment depression at peak exercise.  Blood pressure demonstrated a normal response to exercise.  The patient exercised on the standard Bruce protocol for 4 minutes achieving 95% MPHR with mildly impaired exercise capacity at 6.1 mets.  This is a low risk study.     EKG:  EKG normal sinus rhythm.  Normal EKG.  Recent Labs: No results found for requested labs within last 8760 hours.  Recent Lipid Panel No results found for: CHOL, TRIG, HDL, CHOLHDL, VLDL, LDLCALC, LDLDIRECT  Physical Exam:    VS:  BP (!) 144/68   Pulse 72   Ht 5' 1.5" (1.562 m)   Wt 143 lb 3.2 oz (65 kg)   LMP 08/16/1998   SpO2 97%   BMI 26.62 kg/m     Wt Readings from Last 3 Encounters:  06/25/19 143 lb 3.2 oz (65 kg)  05/23/19 141 lb 12.8 oz (64.3 kg)  05/02/18 143 lb 12.8 oz (65.2 kg)     GEN: Appears younger than stated age. No acute distress HEENT: Normal NECK: No JVD. LYMPHATICS: No lymphadenopathy CARDIAC:  RRR without murmur, gallop, or edema. VASCULAR:  Normal Pulses. No bruits. RESPIRATORY:  Clear to auscultation without rales, wheezing or rhonchi  ABDOMEN: Soft, non-tender, non-distended, No pulsatile mass, MUSCULOSKELETAL:  No deformity  SKIN: Warm and dry NEUROLOGIC:  Alert and oriented x 3 PSYCHIATRIC:  Normal affect   ASSESSMENT:    1. Atypical chest pain   2. Pure hyperglyceridemia   3. Essential hypertension   4. Educated about COVID-19 virus infection   5. Precordial pain    PLAN:  In order of problems listed above:  1. Anginal quality discomfort with atypical features and that it occurs at rest.  Plan coronary CT angiogram with FFR if indicated to determine if the patient has obstructive coronary disease.  This will also determine if there is plaque and whether or not I should push at this age to lower cholesterol.  She is against statin therapy. 2. LDL should ideally be less than 70 in someone having angina.  This would be preventive therapy whether she is having epicardial or microvascular disease.  Imaging if it demonstrates atherosclerosis would help with convincing her to be more aggressive about preventive therapy. 3. Target blood pressure should be less than 140/80 mmHg.  This needs to be watched closely. 4. The 3W's are discussed and is advocated by the patient is lifestyle measures.  Overall education and awareness concerning primary risk prevention was discussed in detail: LDL less than 70, hemoglobin A1c less than 7, blood pressure target less than 130/80 mmHg, >150 minutes of moderate aerobic activity per week, avoidance of smoking, weight control (via diet and exercise), and continued surveillance/management of/for obstructive sleep apnea.    Medication Adjustments/Labs and Tests Ordered: Current medicines are reviewed at length with the patient today.  Concerns regarding medicines are outlined above.  Orders Placed This Encounter  Procedures  . CT CORONARY MORPH W/CTA COR W/SCORE W/CA W/CM &/OR WO/CM  . CT CORONARY FRACTIONAL FLOW RESERVE DATA PREP  . CT CORONARY FRACTIONAL FLOW RESERVE FLUID ANALYSIS  . Basic metabolic panel  . EKG 12-Lead   Meds ordered this encounter   Medications  . metoprolol tartrate (LOPRESSOR) 50 MG tablet    Sig: Take one tablet by mouth 2 hours prior to your CT    Dispense:  1 tablet    Refill:  0    Patient Instructions  Medication Instructions:  Your physician recommends that you continue on your current medications as directed. Please refer to the Current Medication list given to you today.  *If you need a refill on your cardiac medications before your next appointment, please call your pharmacy*  Lab Work: You will need a BMET prior to your CT  If you have labs (blood work) drawn today and your tests are completely normal, you will receive your results only by: Marland Kitchen MyChart Message (if you have MyChart) OR . A paper copy in the mail If you have any lab test that is abnormal or we need to change your treatment, we will call you to review the results.  Testing/Procedures: Your physician recommends that you have a Coronary CT performed.  Follow-Up: At [facility], you and your health needs are our priority.  As part of our continuing mission to provide you with exceptional heart care, we have created designated Provider Care Teams.  These Care Teams include your primary Cardiologist (physician) and Advanced Practice Providers (APPs -  Physician Assistants and Nurse Practitioners) who all work together to provide you with the care you need, when you need it.  Your next appointment:   12 months  The format for your next appointment:   In Person  Provider:   You may see Dr. Daneen Schick or one of the following Advanced Practice Providers on your designated Care Team:    Truitt Merle, NP  Cecilie Kicks, NP  Kathyrn Drown, NP   Other Instructions  Your cardiac CT will be scheduled at one of the below locations:   [facility] [place], [place]  [id number] [phone]  [facility] [address] [place], [place] [address] [phone]  If scheduled at [facility], please arrive at the [facility] main entrance of [facility] 30-45 minutes prior to test start time. Proceed to the [facility] Radiology Department (first floor) to check-in and test prep.  If scheduled at [facility], please arrive 15 mins early for check-in and test prep.  Please follow these instructions carefully (unless otherwise directed):   On the Night Before the Test: . Be sure to Drink plenty of water. . Do not consume any caffeinated/decaffeinated beverages or chocolate 12 hours prior to your test. . Do not take any antihistamines 12 hours prior to your test.  On the Day of the Test: . Drink plenty of water. Do not drink any water within one hour of the test. . Do not eat any food 4 hours prior to the test. . You may take your regular medications prior to the test.  . Take metoprolol (Lopressor) two hours prior to test. . HOLD Furosemide/Hydrochlorothiazide morning of the test. . FEMALES- please wear underwire-free bra if available      After the Test: . Drink plenty of water. . After receiving IV contrast, you may experience a mild flushed feeling. This is normal. . On occasion, you may experience a mild rash up to 24 hours after the test. This is not dangerous. If this occurs, you can take Benadryl 25 mg and increase your fluid intake. . If you experience trouble breathing, this can be serious. If it is severe call 911 IMMEDIATELY. If it is mild, please call our office. . If you take any of these medications: Glipizide/Metformin, Avandament, Glucavance, please do not take 48 hours after completing test unless otherwise instructed.   Once we have confirmed authorization from your insurance company, we will call you to set up a date and time for your test.   For non-scheduling related questions, please contact the cardiac imaging nurse navigator should you have any questions/concerns: Marchia Bond, RN Navigator Cardiac Imaging [facility] [phone] Office     Heart-Healthy Eating Plan Heart-healthy meal planning includes:  Eating less unhealthy fats.  Eating more healthy fats.  Making other changes in your diet. Talk with your doctor or a diet specialist (dietitian) to create an eating plan that is right for you. What is my plan? Your doctor may recommend an eating plan that includes:  Total fat: ______% or less of total calories a day.  Saturated fat: ______% or less of total calories a day.  Cholesterol: less than _________mg a day. What are tips for following this plan? Cooking Avoid frying your food. Try to bake, boil, grill, or broil it instead. You can also reduce fat by:  Removing the skin from poultry.  Removing all visible fats from meats.  Steaming vegetables in water or broth. Meal planning   At meals, divide your plate into four equal parts: ? Fill one-half of your plate with vegetables and green salads. ? Fill one-fourth of your plate with whole grains. ? Fill one-fourth of your plate with lean protein foods.  Eat 4-5 servings of vegetables per day. A serving of vegetables is: ? 1 cup of raw or cooked vegetables. ? 2 cups of raw leafy greens.  Eat 4-5 servings of fruit per day. A serving of fruit is: ? 1 medium whole fruit. ?  cup of  dried fruit. ?  cup of fresh, frozen, or canned fruit. ?  cup of 100% fruit juice.  Eat more foods that have soluble fiber. These are apples, broccoli, carrots, beans, peas, and barley. Try to get 20-30 g of fiber per day.  Eat 4-5 servings of nuts, legumes, and seeds per week: ? 1 serving of dried beans or legumes equals  cup after being cooked. ? 1 serving of nuts is  cup. ? 1 serving of seeds equals 1 tablespoon. General information  Eat more home-cooked food. Eat less restaurant, buffet, and fast food.  Limit or avoid alcohol.  Limit foods that are high in  starch and sugar.  Avoid fried foods.  Lose weight if you are overweight.  Keep track of how much salt (sodium) you eat. This is important if you have high blood pressure. Ask your doctor to tell you more about this.  Try to add vegetarian meals each week. Fats  Choose healthy fats. These include olive oil and canola oil, flaxseeds, walnuts, almonds, and seeds.  Eat more omega-3 fats. These include salmon, mackerel, sardines, tuna, flaxseed oil, and ground flaxseeds. Try to eat fish at least 2 times each week.  Check food labels. Avoid foods with trans fats or high amounts of saturated fat.  Limit saturated fats. ? These are often found in animal products, such as meats, butter, and cream. ? These are also found in plant foods, such as palm oil, palm kernel oil, and coconut oil.  Avoid foods with partially hydrogenated oils in them. These have trans fats. Examples are stick margarine, some tub margarines, cookies, crackers, and other baked goods. What foods can I eat? Fruits All fresh, canned (in natural juice), or frozen fruits. Vegetables Fresh or frozen vegetables (raw, steamed, roasted, or grilled). Green salads. Grains Most grains. Choose whole wheat and whole grains most of the time. Rice and pasta, including brown rice and pastas made with whole wheat. Meats and other proteins Lean, well-trimmed beef, veal, pork, and lamb. Chicken and [place] without skin. All fish and shellfish. Wild duck, rabbit, pheasant, and venison. Egg whites or low-cholesterol egg substitutes. Dried beans, peas, lentils, and tofu. Seeds and most nuts. Dairy Low-fat or nonfat cheeses, including ricotta and mozzarella. Skim or 1% milk that is liquid, powdered, or evaporated. Buttermilk that is made with low-fat milk. Nonfat or low-fat yogurt. Fats and oils Non-hydrogenated (trans-free) margarines. Vegetable oils, including soybean, sesame, sunflower, olive, peanut, safflower, corn, canola, and cottonseed.  Salad dressings or mayonnaise made with a vegetable oil. Beverages Mineral water. Coffee and tea. Diet carbonated beverages. Sweets and desserts Sherbet, gelatin, and fruit ice. Small amounts of dark chocolate. Limit all sweets and desserts. Seasonings and condiments All seasonings and condiments. The items listed above may not be a complete list of foods and drinks you can eat. Contact a dietitian for more options. What foods should I avoid? Fruits Canned fruit in heavy syrup. Fruit in cream or butter sauce. Fried fruit. Limit coconut. Vegetables Vegetables cooked in cheese, cream, or butter sauce. Fried vegetables. Grains Breads that are made with saturated or trans fats, oils, or whole milk. Croissants. Sweet rolls. Donuts. High-fat crackers, such as cheese crackers. Meats and other proteins Fatty meats, such as hot dogs, ribs, sausage, bacon, rib-eye roast or steak. High-fat deli meats, such as salami and bologna. Caviar. Domestic duck and goose. Organ meats, such as liver. Dairy Cream, sour cream, cream cheese, and creamed cottage cheese. Whole-milk cheeses. Whole or 2% milk that  is liquid, evaporated, or condensed. Whole buttermilk. Cream sauce or high-fat cheese sauce. Yogurt that is made from whole milk. Fats and oils Meat fat, or shortening. Cocoa butter, hydrogenated oils, palm oil, coconut oil, palm kernel oil. Solid fats and shortenings, including bacon fat, salt pork, lard, and butter. Nondairy cream substitutes. Salad dressings with cheese or sour cream. Beverages Regular sodas and juice drinks with added sugar. Sweets and desserts Frosting. Pudding. Cookies. Cakes. Pies. Milk chocolate or white chocolate. Buttered syrups. Full-fat ice cream or ice cream drinks. The items listed above may not be a complete list of foods and drinks to avoid. Contact a dietitian for more information. Summary  Heart-healthy meal planning includes eating less unhealthy fats, eating more healthy  fats, and making other changes in your diet.  Eat a balanced diet. This includes fruits and vegetables, low-fat or nonfat dairy, lean protein, nuts and legumes, whole grains, and heart-healthy oils and fats. This information is not intended to replace advice given to you by your health care provider. Make sure you discuss any questions you have with your health care provider. Document Released: 02/01/2012 Document Revised: 10/06/2017 Document Reviewed: 09/09/2017 Elsevier Patient Education  2020 [place], Sinclair Grooms, MD  06/25/2019 4:22 PM    [place]

## 2019-06-25 ENCOUNTER — Encounter: Payer: Self-pay | Admitting: Interventional Cardiology

## 2019-06-25 ENCOUNTER — Other Ambulatory Visit: Payer: Self-pay

## 2019-06-25 ENCOUNTER — Encounter (INDEPENDENT_AMBULATORY_CARE_PROVIDER_SITE_OTHER): Payer: Self-pay

## 2019-06-25 ENCOUNTER — Ambulatory Visit: Payer: Medicare HMO | Admitting: Interventional Cardiology

## 2019-06-25 VITALS — BP 144/68 | HR 72 | Ht 61.5 in | Wt 143.2 lb

## 2019-06-25 DIAGNOSIS — I10 Essential (primary) hypertension: Secondary | ICD-10-CM

## 2019-06-25 DIAGNOSIS — R0789 Other chest pain: Secondary | ICD-10-CM | POA: Diagnosis not present

## 2019-06-25 DIAGNOSIS — R072 Precordial pain: Secondary | ICD-10-CM | POA: Diagnosis not present

## 2019-06-25 DIAGNOSIS — E781 Pure hyperglyceridemia: Secondary | ICD-10-CM

## 2019-06-25 DIAGNOSIS — Z7189 Other specified counseling: Secondary | ICD-10-CM

## 2019-06-25 MED ORDER — METOPROLOL TARTRATE 50 MG PO TABS: ORAL_TABLET | ORAL | 0 refills | Status: DC

## 2019-06-25 NOTE — Patient Instructions (Addendum)
Medication Instructions:  Your physician recommends that you continue on your current medications as directed. Please refer to the Current Medication list given to you today.  *If you need a refill on your cardiac medications before your next appointment, please call your pharmacy*  Lab Work: You will need a BMET prior to your CT  If you have labs (blood work) drawn today and your tests are completely normal, you will receive your results only by: Marland Kitchen MyChart Message (if you have MyChart) OR . A paper copy in the mail If you have any lab test that is abnormal or we need to change your treatment, we will call you to review the results.  Testing/Procedures: Your physician recommends that you have a Coronary CT performed.  Follow-Up: At [facility], you and your health needs are our priority.  As part of our continuing mission to provide you with exceptional heart care, we have created designated Provider Care Teams.  These Care Teams include your primary Cardiologist (physician) and Advanced Practice Providers (APPs -  Physician Assistants and Nurse Practitioners) who all work together to provide you with the care you need, when you need it.  Your next appointment:   12 months  The format for your next appointment:   In Person  Provider:   You may see Dr. Daneen Schick or one of the following Advanced Practice Providers on your designated Care Team:    Truitt Merle, NP  Cecilie Kicks, NP  Kathyrn Drown, NP   Other Instructions  Your cardiac CT will be scheduled at one of the below locations:   [facility] [address] [place], [place] [address] [phone]  [place] [address] [place],  [address] [phone]  If scheduled at [facility], please arrive at the [facility] main entrance of [facility] 30-45 minutes prior to test start time. Proceed to the [facility] Radiology  Department (first floor) to check-in and test prep.  If scheduled at [facility], please arrive 15 mins early for check-in and test prep.  Please follow these instructions carefully (unless otherwise directed):   On the Night Before the Test: . Be sure to Drink plenty of water. . Do not consume any caffeinated/decaffeinated beverages or chocolate 12 hours prior to your test. . Do not take any antihistamines 12 hours prior to your test.  On the Day of the Test: . Drink plenty of water. Do not drink any water within one hour of the test. . Do not eat any food 4 hours prior to the test. . You may take your regular medications prior to the test.  . Take metoprolol (Lopressor) two hours prior to test. . HOLD Furosemide/Hydrochlorothiazide morning of the test. . FEMALES- please wear underwire-free bra if available      After the Test: . Drink plenty of water. . After receiving IV contrast, you may experience a mild flushed feeling. This is normal. . On occasion, you may experience a mild rash up to 24 hours after the test. This is not dangerous. If this occurs, you can take Benadryl 25 mg and increase your fluid intake. . If you experience trouble breathing, this can be serious. If it is severe call 911 IMMEDIATELY. If it is mild, please call our office. . If you take any of these medications: Glipizide/Metformin, Avandament, Glucavance, please do not take 48 hours after completing test unless otherwise instructed.   Once  we have confirmed authorization from your insurance company, we will call you to set up a date and time for your test.   For non-scheduling related questions, please contact the cardiac imaging nurse navigator should you have any questions/concerns: Marchia Bond, RN Navigator Cardiac Imaging [facility] [phone] Office     Heart-Healthy Eating Plan Heart-healthy meal planning includes:  Eating less unhealthy  fats.  Eating more healthy fats.  Making other changes in your diet. Talk with your doctor or a diet specialist (dietitian) to create an eating plan that is right for you. What is my plan? Your doctor may recommend an eating plan that includes:  Total fat: ______% or less of total calories a day.  Saturated fat: ______% or less of total calories a day.  Cholesterol: less than _________mg a day. What are tips for following this plan? Cooking Avoid frying your food. Try to bake, boil, grill, or broil it instead. You can also reduce fat by:  Removing the skin from poultry.  Removing all visible fats from meats.  Steaming vegetables in water or broth. Meal planning   At meals, divide your plate into four equal parts: ? Fill one-half of your plate with vegetables and green salads. ? Fill one-fourth of your plate with whole grains. ? Fill one-fourth of your plate with lean protein foods.  Eat 4-5 servings of vegetables per day. A serving of vegetables is: ? 1 cup of raw or cooked vegetables. ? 2 cups of raw leafy greens.  Eat 4-5 servings of fruit per day. A serving of fruit is: ? 1 medium whole fruit. ?  cup of dried fruit. ?  cup of fresh, frozen, or canned fruit. ?  cup of 100% fruit juice.  Eat more foods that have soluble fiber. These are apples, broccoli, carrots, beans, peas, and barley. Try to get 20-30 g of fiber per day.  Eat 4-5 servings of nuts, legumes, and seeds per week: ? 1 serving of dried beans or legumes equals  cup after being cooked. ? 1 serving of nuts is  cup. ? 1 serving of seeds equals 1 tablespoon. General information  Eat more home-cooked food. Eat less restaurant, buffet, and fast food.  Limit or avoid alcohol.  Limit foods that are high in starch and sugar.  Avoid fried foods.  Lose weight if you are overweight.  Keep track of how much salt (sodium) you eat. This is important if you have high blood pressure. Ask your doctor to  tell you more about this.  Try to add vegetarian meals each week. Fats  Choose healthy fats. These include olive oil and canola oil, flaxseeds, walnuts, almonds, and seeds.  Eat more omega-3 fats. These include salmon, mackerel, sardines, tuna, flaxseed oil, and ground flaxseeds. Try to eat fish at least 2 times each week.  Check food labels. Avoid foods with trans fats or high amounts of saturated fat.  Limit saturated fats. ? These are often found in animal products, such as meats, butter, and cream. ? These are also found in plant foods, such as palm oil, palm kernel oil, and coconut oil.  Avoid foods with partially hydrogenated oils in them. These have trans fats. Examples are stick margarine, some tub margarines, cookies, crackers, and other baked goods. What foods can I eat? Fruits All fresh, canned (in natural juice), or frozen fruits. Vegetables Fresh or frozen vegetables (raw, steamed, roasted, or grilled). Green salads. Grains Most grains. Choose whole wheat and  whole grains most of the time. Rice and pasta, including brown rice and pastas made with whole wheat. Meats and other proteins Lean, well-trimmed beef, veal, pork, and lamb. Chicken and [place] without skin. All fish and shellfish. Wild duck, rabbit, pheasant, and venison. Egg whites or low-cholesterol egg substitutes. Dried beans, peas, lentils, and tofu. Seeds and most nuts. Dairy Low-fat or nonfat cheeses, including ricotta and mozzarella. Skim or 1% milk that is liquid, powdered, or evaporated. Buttermilk that is made with low-fat milk. Nonfat or low-fat yogurt. Fats and oils Non-hydrogenated (trans-free) margarines. Vegetable oils, including soybean, sesame, sunflower, olive, peanut, safflower, corn, canola, and cottonseed. Salad dressings or mayonnaise made with a vegetable oil. Beverages Mineral water. Coffee and tea. Diet carbonated beverages. Sweets and desserts Sherbet, gelatin, and fruit ice. Small amounts  of dark chocolate. Limit all sweets and desserts. Seasonings and condiments All seasonings and condiments. The items listed above may not be a complete list of foods and drinks you can eat. Contact a dietitian for more options. What foods should I avoid? Fruits Canned fruit in heavy syrup. Fruit in cream or butter sauce. Fried fruit. Limit coconut. Vegetables Vegetables cooked in cheese, cream, or butter sauce. Fried vegetables. Grains Breads that are made with saturated or trans fats, oils, or whole milk. Croissants. Sweet rolls. Donuts. High-fat crackers, such as cheese crackers. Meats and other proteins Fatty meats, such as hot dogs, ribs, sausage, bacon, rib-eye roast or steak. High-fat deli meats, such as salami and bologna. Caviar. Domestic duck and goose. Organ meats, such as liver. Dairy Cream, sour cream, cream cheese, and creamed cottage cheese. Whole-milk cheeses. Whole or 2% milk that is liquid, evaporated, or condensed. Whole buttermilk. Cream sauce or high-fat cheese sauce. Yogurt that is made from whole milk. Fats and oils Meat fat, or shortening. Cocoa butter, hydrogenated oils, palm oil, coconut oil, palm kernel oil. Solid fats and shortenings, including bacon fat, salt pork, lard, and butter. Nondairy cream substitutes. Salad dressings with cheese or sour cream. Beverages Regular sodas and juice drinks with added sugar. Sweets and desserts Frosting. Pudding. Cookies. Cakes. Pies. Milk chocolate or white chocolate. Buttered syrups. Full-fat ice cream or ice cream drinks. The items listed above may not be a complete list of foods and drinks to avoid. Contact a dietitian for more information. Summary  Heart-healthy meal planning includes eating less unhealthy fats, eating more healthy fats, and making other changes in your diet.  Eat a balanced diet. This includes fruits and vegetables, low-fat or nonfat dairy, lean protein, nuts and legumes, whole grains, and heart-healthy  oils and fats. This information is not intended to replace advice given to you by your health care provider. Make sure you discuss any questions you have with your health care provider. Document Released: 02/01/2012 Document Revised: 10/06/2017 Document Reviewed: 09/09/2017 Elsevier Patient Education  2020 [employer].

## 2019-06-28 DIAGNOSIS — R195 Other fecal abnormalities: Secondary | ICD-10-CM | POA: Diagnosis not present

## 2019-07-25 DIAGNOSIS — Z01812 Encounter for preprocedural laboratory examination: Secondary | ICD-10-CM | POA: Diagnosis not present

## 2019-07-25 DIAGNOSIS — Z20828 Contact with and (suspected) exposure to other viral communicable diseases: Secondary | ICD-10-CM | POA: Diagnosis not present

## 2019-07-27 DIAGNOSIS — R195 Other fecal abnormalities: Secondary | ICD-10-CM | POA: Diagnosis not present

## 2019-07-27 DIAGNOSIS — K219 Gastro-esophageal reflux disease without esophagitis: Secondary | ICD-10-CM | POA: Diagnosis not present

## 2019-07-27 DIAGNOSIS — Z79899 Other long term (current) drug therapy: Secondary | ICD-10-CM | POA: Diagnosis not present

## 2019-07-27 DIAGNOSIS — K5730 Diverticulosis of large intestine without perforation or abscess without bleeding: Secondary | ICD-10-CM | POA: Diagnosis not present

## 2019-07-27 DIAGNOSIS — I10 Essential (primary) hypertension: Secondary | ICD-10-CM | POA: Diagnosis not present

## 2019-08-02 DIAGNOSIS — D692 Other nonthrombocytopenic purpura: Secondary | ICD-10-CM | POA: Diagnosis not present

## 2019-08-02 DIAGNOSIS — L700 Acne vulgaris: Secondary | ICD-10-CM | POA: Diagnosis not present

## 2019-08-02 DIAGNOSIS — L570 Actinic keratosis: Secondary | ICD-10-CM | POA: Diagnosis not present

## 2019-08-02 DIAGNOSIS — L814 Other melanin hyperpigmentation: Secondary | ICD-10-CM | POA: Diagnosis not present

## 2019-08-14 ENCOUNTER — Other Ambulatory Visit: Payer: Self-pay | Admitting: Internal Medicine

## 2019-08-14 DIAGNOSIS — M8580 Other specified disorders of bone density and structure, unspecified site: Secondary | ICD-10-CM

## 2019-08-15 ENCOUNTER — Ambulatory Visit
Admission: RE | Admit: 2019-08-15 | Discharge: 2019-08-15 | Disposition: A | Discharge status: Home / Self Care | Payer: Medicare HMO | Source: Ambulatory Visit | Attending: Internal Medicine | Admitting: Internal Medicine

## 2019-08-15 ENCOUNTER — Other Ambulatory Visit: Payer: Self-pay

## 2019-08-15 DIAGNOSIS — M8589 Other specified disorders of bone density and structure, multiple sites: Secondary | ICD-10-CM | POA: Diagnosis not present

## 2019-08-15 DIAGNOSIS — M8580 Other specified disorders of bone density and structure, unspecified site: Secondary | ICD-10-CM

## 2019-08-15 DIAGNOSIS — Z780 Asymptomatic menopausal state: Secondary | ICD-10-CM | POA: Diagnosis not present

## 2019-09-04 DIAGNOSIS — R040 Epistaxis: Secondary | ICD-10-CM | POA: Diagnosis not present

## 2019-09-04 DIAGNOSIS — J343 Hypertrophy of nasal turbinates: Secondary | ICD-10-CM | POA: Diagnosis not present

## 2019-09-04 DIAGNOSIS — Z7289 Other problems related to lifestyle: Secondary | ICD-10-CM | POA: Diagnosis not present

## 2019-09-06 DIAGNOSIS — R69 Illness, unspecified: Secondary | ICD-10-CM | POA: Diagnosis not present

## 2019-09-11 ENCOUNTER — Other Ambulatory Visit: Payer: Self-pay | Admitting: *Deleted

## 2019-09-11 ENCOUNTER — Other Ambulatory Visit: Payer: Self-pay

## 2019-09-11 ENCOUNTER — Other Ambulatory Visit: Payer: Medicare HMO

## 2019-09-11 DIAGNOSIS — R0789 Other chest pain: Secondary | ICD-10-CM

## 2019-09-11 MED ORDER — METOPROLOL TARTRATE 50 MG PO TABS: ORAL_TABLET | ORAL | 0 refills | Status: DC

## 2019-09-12 ENCOUNTER — Telehealth (HOSPITAL_COMMUNITY): Payer: Self-pay | Admitting: Emergency Medicine

## 2019-09-12 LAB — BASIC METABOLIC PANEL
BUN/Creatinine Ratio: 15 (ref 12–28)
BUN: 11 mg/dL (ref 8–27)
CO2: 27 mmol/L (ref 20–29)
Calcium: 9.4 mg/dL (ref 8.7–10.3)
Chloride: 97 mmol/L (ref 96–106)
Creatinine, Ser: 0.73 mg/dL (ref 0.57–1.00)
GFR calc Af Amer: 89 mL/min/{1.73_m2} (ref >59)
GFR calc non Af Amer: 77 mL/min/{1.73_m2} (ref >59)
Glucose: 108 mg/dL — ABNORMAL HIGH (ref 65–99)
Potassium: 3.8 mmol/L (ref 3.5–5.2)
Sodium: 136 mmol/L (ref 134–144)

## 2019-09-12 NOTE — Telephone Encounter (Signed)
Reaching out to patient to offer assistance regarding upcoming cardiac imaging study; pt verbalizes understanding of appt date/time, parking situation and where to check in, pre-test NPO status and medications ordered, and verified current allergies; name and call back number provided for further questions should they arise Jazminn Pomales RN Navigator Cardiac Imaging [facility] [phone] office [phone] cell 

## 2019-09-13 ENCOUNTER — Other Ambulatory Visit: Payer: Self-pay

## 2019-09-13 ENCOUNTER — Ambulatory Visit
Admission: RE | Admit: 2019-09-13 | Discharge: 2019-09-13 | Disposition: A | Discharge status: Home / Self Care | Payer: Medicare HMO | Source: Ambulatory Visit | Attending: Interventional Cardiology | Admitting: Interventional Cardiology

## 2019-09-13 DIAGNOSIS — R072 Precordial pain: Secondary | ICD-10-CM

## 2019-09-13 MED ORDER — IOHEXOL 350 MG/ML SOLN
75.0000 mL | Freq: Once | INTRAVENOUS | Status: AC | PRN
  Administered 2019-09-13: 75 mL via INTRAVENOUS

## 2019-09-13 MED ORDER — NITROGLYCERIN 0.4 MG SL SUBL
0.8000 mg | SUBLINGUAL_TABLET | Freq: Once | SUBLINGUAL | Status: AC
  Administered 2019-09-13: 0.8 mg via SUBLINGUAL

## 2019-09-13 NOTE — Progress Notes (Signed)
Patient tolerate CT without incident. Drank water after. Ambulated to exit steady gait.

## 2019-09-14 ENCOUNTER — Encounter: Payer: Self-pay | Admitting: *Deleted

## 2019-09-20 ENCOUNTER — Telehealth: Payer: Self-pay | Admitting: Interventional Cardiology

## 2019-09-20 NOTE — Telephone Encounter (Signed)
Medical records requested from [facility]. 09/20/19 vlm

## 2019-11-12 ENCOUNTER — Other Ambulatory Visit: Payer: Self-pay | Admitting: Obstetrics & Gynecology

## 2019-11-12 DIAGNOSIS — Z01419 Encounter for gynecological examination (general) (routine) without abnormal findings: Secondary | ICD-10-CM

## 2019-11-12 NOTE — Telephone Encounter (Signed)
Medication refill request: progesterone Last AEX:  05-23-2019 Next AEX: 07-18-2020 Last MMG (if hormonal medication request): 05-21-2019 category b density birads 1:neg Refill authorized:  Prometrium sent 05/2019 for 39yr. Pharmacy requesting generic Progesterone instead due to insurance coverage. Please approve if appropriate.

## 2019-11-13 NOTE — Telephone Encounter (Signed)
Please let patient know that Dr. Sabra Heck will need to review the medication refill request upon her return to the office next week.   Cc- Dr. Sabra Heck

## 2019-11-13 NOTE — Telephone Encounter (Signed)
Spoke to pt. Pt states wanting to request  brand for Prometrium instead of generic. Pt states will  call pharmacy with [employer] and see if med can be approved for brand Prometrium. Pt states has 1 month supply left. Pt informed that Dr Sabra Heck will review and will get back with pt next week. Pt agreeable. Pt to call on 11/19/2019 with update from [facility] and will discuss plan with Dr Sabra Heck at this time.   Routing to Dr Quincy Simmonds for update   Cc: Dr Sabra Heck.

## 2019-11-21 NOTE — Telephone Encounter (Signed)
When she needs RF, we will send as brand only and see if PA will be approved.  If not, then she will have to decide to pay for this or try generic.  Just let me know if rx needs to be done.  Thanks.

## 2019-11-22 NOTE — Telephone Encounter (Signed)
Spoke with pt. Pt states has not called [employer] to see if brand Prometrium will be covered, but will call this morning and return call to this RN for update.

## 2019-12-13 DIAGNOSIS — R69 Illness, unspecified: Secondary | ICD-10-CM | POA: Diagnosis not present

## 2019-12-13 DIAGNOSIS — Z794 Long term (current) use of insulin: Secondary | ICD-10-CM | POA: Diagnosis not present

## 2019-12-13 DIAGNOSIS — H40013 Open angle with borderline findings, low risk, bilateral: Secondary | ICD-10-CM | POA: Diagnosis not present

## 2020-02-01 DIAGNOSIS — S80811A Abrasion, right lower leg, initial encounter: Secondary | ICD-10-CM | POA: Diagnosis not present

## 2020-02-01 DIAGNOSIS — L03115 Cellulitis of right lower limb: Secondary | ICD-10-CM | POA: Diagnosis not present

## 2020-02-05 ENCOUNTER — Telehealth: Payer: Self-pay | Admitting: Obstetrics & Gynecology

## 2020-02-05 NOTE — Telephone Encounter (Signed)
Patient would like to speak with nurse about getting a medication prior authorization.

## 2020-02-06 NOTE — Telephone Encounter (Signed)
Spoke with patient. Patient is requesting PA for Prometrium, brand only. Patient reports she has never tried generic. Takes Prometrium 100 mg PO daily along with Vivelle Dot 0.025 mg patch twice weekly for postmenopausal vasomotor symptoms. Patient reports she has been on the medication for many years. Per review of Epic, patient has been in Prometrium since at least 2014. Advised patient will submit PA to plan, will notify of response once received. Advised this can take up to 5 business days. Patient agreeable.   PA completed for Prometrium and faxed to [facility].

## 2020-02-06 NOTE — Telephone Encounter (Signed)
Fax notification received from [facility].  PA for Prometrium capsule approved.  Valid 08/17/19 -08/15/20

## 2020-02-07 NOTE — Telephone Encounter (Signed)
Patient notified prior auth for Prometrium approved. Annual exam rescheduled to 05-29-20 per patient request due to previous scheduling discrenpancy.   Routing to provider, encounter closed.

## 2020-02-22 DIAGNOSIS — M1712 Unilateral primary osteoarthritis, left knee: Secondary | ICD-10-CM | POA: Diagnosis not present

## 2020-02-22 DIAGNOSIS — M25562 Pain in left knee: Secondary | ICD-10-CM | POA: Diagnosis not present

## 2020-03-12 DIAGNOSIS — R69 Illness, unspecified: Secondary | ICD-10-CM | POA: Diagnosis not present

## 2020-04-07 DIAGNOSIS — I10 Essential (primary) hypertension: Secondary | ICD-10-CM | POA: Diagnosis not present

## 2020-04-07 DIAGNOSIS — R42 Dizziness and giddiness: Secondary | ICD-10-CM | POA: Diagnosis not present

## 2020-04-07 DIAGNOSIS — R5383 Other fatigue: Secondary | ICD-10-CM | POA: Diagnosis not present

## 2020-04-07 DIAGNOSIS — R911 Solitary pulmonary nodule: Secondary | ICD-10-CM | POA: Diagnosis not present

## 2020-04-08 ENCOUNTER — Other Ambulatory Visit: Payer: Self-pay | Admitting: Registered Nurse

## 2020-04-08 DIAGNOSIS — R911 Solitary pulmonary nodule: Secondary | ICD-10-CM

## 2020-04-11 ENCOUNTER — Other Ambulatory Visit: Payer: Self-pay | Admitting: Internal Medicine

## 2020-04-11 DIAGNOSIS — R911 Solitary pulmonary nodule: Secondary | ICD-10-CM

## 2020-04-16 ENCOUNTER — Other Ambulatory Visit: Payer: Self-pay | Admitting: Internal Medicine

## 2020-04-17 ENCOUNTER — Other Ambulatory Visit: Payer: Self-pay | Admitting: Internal Medicine

## 2020-04-18 ENCOUNTER — Other Ambulatory Visit: Payer: Self-pay

## 2020-04-18 ENCOUNTER — Ambulatory Visit
Admission: RE | Admit: 2020-04-18 | Discharge: 2020-04-18 | Disposition: A | Discharge status: Home / Self Care | Payer: Medicare HMO | Source: Ambulatory Visit | Attending: Registered Nurse | Admitting: Registered Nurse

## 2020-04-18 DIAGNOSIS — K449 Diaphragmatic hernia without obstruction or gangrene: Secondary | ICD-10-CM | POA: Diagnosis not present

## 2020-04-18 DIAGNOSIS — R911 Solitary pulmonary nodule: Secondary | ICD-10-CM

## 2020-04-18 DIAGNOSIS — I700 Atherosclerosis of aorta: Secondary | ICD-10-CM | POA: Diagnosis not present

## 2020-04-18 DIAGNOSIS — J439 Emphysema, unspecified: Secondary | ICD-10-CM | POA: Diagnosis not present

## 2020-04-18 DIAGNOSIS — M47814 Spondylosis without myelopathy or radiculopathy, thoracic region: Secondary | ICD-10-CM | POA: Diagnosis not present

## 2020-04-18 MED ORDER — IOPAMIDOL (ISOVUE-300) INJECTION 61%
75.0000 mL | Freq: Once | INTRAVENOUS | Status: AC | PRN
  Administered 2020-04-18: 75 mL via INTRAVENOUS

## 2020-05-05 ENCOUNTER — Ambulatory Visit: Payer: Medicare HMO | Admitting: Emergency Medicine

## 2020-05-05 ENCOUNTER — Other Ambulatory Visit: Payer: Self-pay

## 2020-05-05 ENCOUNTER — Encounter: Payer: Self-pay | Admitting: Emergency Medicine

## 2020-05-05 DIAGNOSIS — R918 Other nonspecific abnormal finding of lung field: Secondary | ICD-10-CM | POA: Insufficient documentation

## 2020-05-05 DIAGNOSIS — R911 Solitary pulmonary nodule: Secondary | ICD-10-CM

## 2020-05-05 NOTE — Patient Instructions (Signed)
We will plan to repeat your CT scan of the chest in December so that we can compare to your priors for stability. Depending on the results we will decide whether any other testing is indicated including possible bronchoscopy. Follow with Dr. Lamonte Sakai in December after the CT so that we can review together. Get your second COVID-19 shot as planned.

## 2020-05-05 NOTE — Progress Notes (Signed)
Subjective:    Patient ID: Marissa Jacobs, female    DOB: Feb 04, 1936, 84 y.o.   MRN: [id number]  HPI 84 year old minimal former smoker (3-4 pack years) with a history of hypertension, hyperlipidemia, iron deficiency anemia, GERD.  She underwent a cardiac CT 09/13/2019 that revealed a 1.8 x 1.0 right lower lobe groundglass opacity in the superior segment.  This prompted a repeat dedicated CT scan of the chest done 04/19/2020 which I have reviewed.  The right lower lobe groundglass opacity is stable to smaller in size 1.6 x 0.8 cm.  A 2.2 x 1.3 cm left upper lobe groundglass nodule is evident on this scan.  Also seen are small groundglass opacities in the lateral left upper lobe, right lower lobe, right upper lobe 5 mm. She denies any symptoms. No dyspnea, sometimes a cough but resolving since off lisinopril. No CP.    Review of Systems As per HPI  Past Medical History:  Diagnosis Date  . Anemia    secondary to taking omeprazole-on iron  . Diverticulosis 2/04  . GERD (gastroesophageal reflux disease)   . Hyperlipidemia   . Hypertension   . Osteopenia   . Post traumatic stress disorder   . STD (sexually transmitted disease)    HSV     Family History  Problem Relation Age of Onset  . COPD Mother   . Sudden death Father 60  . Healthy Brother      Social History   Socioeconomic History  . Marital status: Divorced    Spouse name: Not on file  . Number of children: Not on file  . Years of education: Not on file  . Highest education level: Not on file  Occupational History  . Not on file  Tobacco Use  . Smoking status: Former Smoker    Quit date: 02/21/1963    Years since quitting: 57.2  . Smokeless tobacco: Never Used  . Tobacco comment: in college  Vaping Use  . Vaping Use: Never used  Substance and Sexual Activity  . Alcohol use: Yes    Alcohol/week: 5.0 standard drinks    Types: 5 Standard drinks or equivalent per week  . Drug use: No  . Sexual activity: Yes     Partners: Male    Birth control/protection: Post-menopausal  Other Topics Concern  . Not on file  Social History Narrative  . Not on file   Social Determinants of Health   Financial Resource Strain:   . Difficulty of Paying Living Expenses: Not on file  Food Insecurity:   . Worried About Charity fundraiser in the Last Year: Not on file  . Ran Out of Food in the Last Year: Not on file  Transportation Needs:   . Lack of Transportation (Medical): Not on file  . Lack of Transportation (Non-Medical): Not on file  Physical Activity:   . Days of Exercise per Week: Not on file  . Minutes of Exercise per Session: Not on file  Stress:   . Feeling of Stress : Not on file  Social Connections:   . Frequency of Communication with Friends and Family: Not on file  . Frequency of Social Gatherings with Friends and Family: Not on file  . Attends Religious Services: Not on file  . Active Member of Clubs or Organizations: Not on file  . Attends Archivist Meetings: Not on file  . Marital Status: Not on file  Intimate Partner Violence:   . Fear of Current or Ex-Partner:  Not on file  . Emotionally Abused: Not on file  . Physically Abused: Not on file  . Sexually Abused: Not on file     Allergies  Allergen Reactions  . Abobotulinumtoxina Rash  . Codeine Hives  . Amoxicillin Itching and Rash     Outpatient Medications Prior to Visit  Medication Sig Dispense Refill  . CALCIUM PO Take 600 mg by mouth 2 (two) times daily.     . Cholecalciferol (VITAMIN D PO) Take 2,500 mg by mouth every other day.     . Coenzyme Q10 (CO Q 10 PO) Take by mouth daily.    Marland Kitchen EPIPEN 2-PAK 0.3 MG/0.3ML SOAJ injection     . estradiol (VIVELLE-DOT) 0.025 MG/24HR Place 1 patch onto the skin 2 (two) times a week. 24 patch 4  . fexofenadine (ALLEGRA) 180 MG tablet Take 180 mg by mouth once a week.    . folic acid (FOLVITE) 366 MCG tablet Take 400 mcg by mouth daily.    . hydrochlorothiazide (MICROZIDE) 12.5  MG capsule Take 12.5 mg by mouth daily.     . Magnesium 300 MG CAPS Take 300 mg by mouth daily.    . Omega-3 Fatty Acids (FISH OIL PO) Take 600 mg by mouth daily.     Marland Kitchen omeprazole (PRILOSEC) 10 MG capsule Take 10 mg by mouth daily.  11  . PROMETRIUM 100 MG capsule Take 1 capsule (100 mg total) by mouth daily. 90 capsule 4  . tretinoin (RETIN-A) 0.025 % cream Apply 1 application topically at bedtime.     Marland Kitchen lisinopril (PRINIVIL,ZESTRIL) 10 MG tablet Take 10 mg by mouth daily. (Patient not taking: Reported on 05/05/2020)    . losartan (COZAAR) 25 MG tablet Take 25 mg by mouth daily.    . metoprolol tartrate (LOPRESSOR) 50 MG tablet Take one tablet by mouth 2 hours prior to your CT (Patient not taking: Reported on 05/05/2020) 1 tablet 0  . Multiple Vitamins-Minerals (MULTIVITAMIN WITH MINERALS) tablet Take 1 tablet by mouth daily. Reported on 11/11/2015 (Patient not taking: Reported on 05/05/2020)    . nitroGLYCERIN (NITROSTAT) 0.4 MG SL tablet Place 1 tablet (0.4 mg total) under the tongue every 5 (five) minutes as needed for chest pain. 25 tablet 3   No facility-administered medications prior to visit.         Objective:   Physical Exam  Vitals:   05/05/20 1024  BP: 140/72  Pulse: 75  Temp: 97.8 F (36.6 C)  TempSrc: Temporal  SpO2: 99%  Weight: 140 lb 9.6 oz (63.8 kg)  Height: 5\' 1"  (1.549 m)   Gen: Pleasant, well-nourished, in no distress,  normal affect  ENT: No lesions,  mouth clear,  oropharynx clear, no postnasal drip  Neck: No JVD, no stridor  Lungs: No use of accessory muscles, no crackles or wheezing on normal respiration, no wheeze on forced expiration  Cardiovascular: RRR, heart sounds normal, no murmur or gallops, no peripheral edema  Musculoskeletal: No deformities, no cyanosis or clubbing  Neuro: alert, awake, non focal  Skin: Warm, no lesions or rashe     Assessment & Plan:  Pulmonary nodules/lesions, multiple Scattered pulmonary nodules.  The largest is  now identified on her dedicated CT chest, 2.2 x 1.3 cm left upper lobe groundglass nodule.  There is some influence and displacement of the fissure associated with the lesion which is concerning for possible malignancy.  We will plan to repeat her CT scan of the chest in 3 months to follow  for interval change, stability.  Depending on results we will decide whether to arrange for navigational bronchoscopy, tissue sampling, culture data.  We will plan to repeat your CT scan of the chest in December so that we can compare to your priors for stability. Depending on the results we will decide whether any other testing is indicated including possible bronchoscopy. Follow with Dr. Lamonte Sakai in December after the CT so that we can review together. Get your second COVID-19 shot as planned.  Baltazar Apo, MD, PhD 05/05/2020, 5:19 PM [facility] [phone] or if no answer [phone]

## 2020-05-05 NOTE — Assessment & Plan Note (Signed)
Scattered pulmonary nodules.  The largest is now identified on her dedicated CT chest, 2.2 x 1.3 cm left upper lobe groundglass nodule.  There is some influence and displacement of the fissure associated with the lesion which is concerning for possible malignancy.  We will plan to repeat her CT scan of the chest in 3 months to follow for interval change, stability.  Depending on results we will decide whether to arrange for navigational bronchoscopy, tissue sampling, culture data.  We will plan to repeat your CT scan of the chest in December so that we can compare to your priors for stability. Depending on the results we will decide whether any other testing is indicated including possible bronchoscopy. Follow with Dr. Lamonte Sakai in December after the CT so that we can review together. Get your second COVID-19 shot as planned.

## 2020-05-06 ENCOUNTER — Ambulatory Visit: Payer: Medicare HMO | Admitting: Obstetrics & Gynecology

## 2020-05-14 ENCOUNTER — Other Ambulatory Visit: Payer: Self-pay | Admitting: *Deleted

## 2020-05-14 NOTE — Telephone Encounter (Signed)
Medication refill request: Estradiol patch  Last AEX:  05-23-2019 SM  Next AEX: 05-29-20  Last MMG (if hormonal medication request): 05-18-2019 density B/BIRADS 1 negative  Refill authorized: Today, please advise.   Pharmacy unavailable in [facility] system. Refill to print and be faxed to [facility]. Fax #: [phone]).  Medication pended to print for #8, 0RF. Please refill if appropriate.

## 2020-05-16 MED ORDER — ESTRADIOL 0.025 MG/24HR TD PTTW: 1.0000 | MEDICATED_PATCH | TRANSDERMAL | 0 refills | Status: DC

## 2020-05-16 NOTE — Telephone Encounter (Signed)
Prescription for Vivelle-dot sent to [facility]. Fax #: [phone].  Encounter closed.

## 2020-05-23 DIAGNOSIS — E785 Hyperlipidemia, unspecified: Secondary | ICD-10-CM | POA: Diagnosis not present

## 2020-05-23 DIAGNOSIS — E559 Vitamin D deficiency, unspecified: Secondary | ICD-10-CM | POA: Diagnosis not present

## 2020-05-23 DIAGNOSIS — E041 Nontoxic single thyroid nodule: Secondary | ICD-10-CM | POA: Diagnosis not present

## 2020-05-23 DIAGNOSIS — I10 Essential (primary) hypertension: Secondary | ICD-10-CM | POA: Diagnosis not present

## 2020-05-26 ENCOUNTER — Ambulatory Visit: Payer: Medicare HMO | Admitting: Interventional Cardiology

## 2020-05-26 NOTE — Progress Notes (Signed)
84 y.o. G23P1011 Divorced White or Caucasian female here for breast and pelvic exam.  I am also following her for HRT use.  She desires to continue HRT.  Clearly aware of risk of DVT/PE, breast cancer.  She brought lab work from Dr. Virgina Jock.  Has questions about MPV that was low on lab work.  Platelet count was normal.    She had a coronary CT that showed an incidental nodule.  Follow up CT showed a second nodule.  Saw Dr. Lamonte Sakai.  Would like a second opinion.  She is very anxious about this.  Has considered going to the [facility] clinic.  We discussed other options that are local.    Denies vaginal bleeding.  Patient's last menstrual period was 08/16/1998.          Sexually active: Yes.    H/O STD:  HSV  Health Maintenance: PCP:  Shon Baton  Last wellness appt was appt tomorrow  Did blood work at that appt:  Patient brought labwork in from 8.2021 Vaccines are up to date:  I do not have documentation about pneumonia vaccination.  Pt has checked with Dr. Virgina Jock and did receive this. Colonoscopy:  07/2019 neg per patient MMG:  05-18-2019 catgory b density birads 1:neg BMD:  08-16-2019 Last pap smear:  11-11-15 neg, 05-23-2019 neg H/o abnormal pap smear:  LEEP, pt states she doesn't remember having it.    reports that she quit smoking about 57 years ago. She has never used smokeless tobacco. She reports current alcohol use of about 5.0 standard drinks of alcohol per week. She reports that she does not use drugs.  Past Medical History:  Diagnosis Date  . Anemia    secondary to taking omeprazole-on iron  . Diverticulosis 2/04  . GERD (gastroesophageal reflux disease)   . Hyperlipidemia   . Hypertension   . Lung nodule   . Osteopenia   . Post traumatic stress disorder   . STD (sexually transmitted disease)    HSV    Past Surgical History:  Procedure Laterality Date  . APPENDECTOMY    . CERVICAL BIOPSY  W/ LOOP ELECTRODE EXCISION     CIN1  . COLPOSCOPY    . ESOPHAGEAL DILATION  2013  .  FACIAL COSMETIC SURGERY  9/03  . KNEE SURGERY    . TONSILLECTOMY      Current Outpatient Medications  Medication Sig Dispense Refill  . CALCIUM PO Take 600 mg by mouth 2 (two) times daily.     . Cholecalciferol (VITAMIN D PO) Take 2,500 mg by mouth every other day.     . Coenzyme Q10 (CO Q 10 PO) Take by mouth daily.    Marland Kitchen EPIPEN 2-PAK 0.3 MG/0.3ML SOAJ injection     . estradiol (VIVELLE-DOT) 0.025 MG/24HR Place 1 patch onto the skin 2 (two) times a week. 24 patch 0  . fexofenadine (ALLEGRA) 180 MG tablet Take 180 mg by mouth once a week.    . folic acid (FOLVITE) 585 MCG tablet Take 400 mcg by mouth daily.    . hydrochlorothiazide (MICROZIDE) 12.5 MG capsule Take 12.5 mg by mouth daily.     Marland Kitchen losartan (COZAAR) 25 MG tablet Take 25 mg by mouth daily.    . Magnesium 300 MG CAPS Take 300 mg by mouth daily.    . Omega-3 Fatty Acids (FISH OIL PO) Take 600 mg by mouth daily.     Marland Kitchen omeprazole (PRILOSEC) 10 MG capsule Take 10 mg by mouth daily.  11  .  PROMETRIUM 100 MG capsule Take 1 capsule (100 mg total) by mouth daily. 90 capsule 4  . tretinoin (RETIN-A) 0.025 % cream Apply 1 application topically at bedtime.      No current facility-administered medications for this visit.    Family History  Problem Relation Age of Onset  . COPD Mother   . Sudden death Father 33  . Healthy Brother     Review of Systems  Constitutional: Negative.   HENT: Negative.   Eyes: Negative.   Respiratory: Negative.   Cardiovascular: Negative.   Gastrointestinal: Negative.   Endocrine: Negative.   Genitourinary: Negative.   Musculoskeletal: Negative.   Skin: Negative.   Allergic/Immunologic: Negative.   Neurological: Negative.   Hematological: Negative.   Psychiatric/Behavioral: Negative.     Exam:   BP 118/80   Pulse 70   Resp 16   Ht 5' 0.5" (1.537 m)   Wt 140 lb (63.5 kg)   LMP 08/16/1998   BMI 26.89 kg/m   Height: 5' 0.5" (153.7 cm)  General appearance: alert, cooperative and appears  stated age Breasts: normal appearance, no masses or tenderness Abdomen: soft, non-tender; bowel sounds normal; no masses,  no organomegaly Lymph nodes: Cervical, supraclavicular, and axillary nodes normal.  No abnormal inguinal nodes palpated Neurologic: Grossly normal  Pelvic: External genitalia:  no lesions              Urethra:  normal appearing urethra with no masses, tenderness or lesions              Bartholins and Skenes: normal                 Vagina: normal appearing vagina with normal color and discharge, no lesions              Cervix: no lesions              Pap taken: No. Bimanual Exam:  Uterus:  normal size, contour, position, consistency, mobility, non-tender              Adnexa: normal adnexa and no mass, fullness, tenderness               Rectovaginal: Confirms               Anus:  normal sphincter tone, no lesions  Chaperone, Royal Hawthorn, CMA, was present for exam.  A:  Breast and Pelvic exam HRT use in PMP female Osteopenia H/o HSV, no recent outbreak OAB with mild SUI GERD--has endoscopy schedule Newly diagnosed lung nodules  P:   Mammogram guidelines reviewed.  Yearly recommended as she is on HRT. pap smear not indicated RF for vivelle dot 0.025mg  patches twice weekly.  No substitution.  #24/4RF and Prometrium (no substitution), 1 po q day.  #90/4RF. Has appt with Dr. Virgina Jock tomorrow.  Has just done blood work.  This will be signed and scanned into Epic. BMD normal 2020 Colonoscopy, vaccines UTD  return annually or prn  35 minutes of total time was spent for this patient encounter, including preparation, face-to-face counseling with the patient and coordination of care, and documentation of the encounter.

## 2020-05-29 ENCOUNTER — Ambulatory Visit (INDEPENDENT_AMBULATORY_CARE_PROVIDER_SITE_OTHER): Payer: Medicare HMO | Admitting: Obstetrics & Gynecology

## 2020-05-29 ENCOUNTER — Encounter: Payer: Self-pay | Admitting: Obstetrics & Gynecology

## 2020-05-29 ENCOUNTER — Other Ambulatory Visit: Payer: Self-pay

## 2020-05-29 VITALS — BP 118/80 | HR 70 | Resp 16 | Ht 60.5 in | Wt 140.0 lb

## 2020-05-29 DIAGNOSIS — Z9289 Personal history of other medical treatment: Secondary | ICD-10-CM

## 2020-05-29 DIAGNOSIS — Z01419 Encounter for gynecological examination (general) (routine) without abnormal findings: Secondary | ICD-10-CM

## 2020-05-29 DIAGNOSIS — Z79890 Hormone replacement therapy (postmenopausal): Secondary | ICD-10-CM

## 2020-05-29 MED ORDER — PROMETRIUM 100 MG PO CAPS: 100.0000 mg | ORAL_CAPSULE | Freq: Every day | ORAL | 4 refills | Status: DC

## 2020-05-29 MED ORDER — ESTRADIOL 0.025 MG/24HR TD PTTW: 1.0000 | MEDICATED_PATCH | TRANSDERMAL | 4 refills | Status: DC

## 2020-05-30 DIAGNOSIS — G4700 Insomnia, unspecified: Secondary | ICD-10-CM | POA: Diagnosis not present

## 2020-05-30 DIAGNOSIS — E041 Nontoxic single thyroid nodule: Secondary | ICD-10-CM | POA: Diagnosis not present

## 2020-05-30 DIAGNOSIS — I10 Essential (primary) hypertension: Secondary | ICD-10-CM | POA: Diagnosis not present

## 2020-05-30 DIAGNOSIS — E785 Hyperlipidemia, unspecified: Secondary | ICD-10-CM | POA: Diagnosis not present

## 2020-05-30 DIAGNOSIS — K224 Dyskinesia of esophagus: Secondary | ICD-10-CM | POA: Diagnosis not present

## 2020-05-30 DIAGNOSIS — I2584 Coronary atherosclerosis due to calcified coronary lesion: Secondary | ICD-10-CM | POA: Diagnosis not present

## 2020-05-30 DIAGNOSIS — Z0000 Encounter for general adult medical examination without abnormal findings: Secondary | ICD-10-CM | POA: Diagnosis not present

## 2020-05-30 DIAGNOSIS — I872 Venous insufficiency (chronic) (peripheral): Secondary | ICD-10-CM | POA: Diagnosis not present

## 2020-05-30 DIAGNOSIS — E559 Vitamin D deficiency, unspecified: Secondary | ICD-10-CM | POA: Diagnosis not present

## 2020-05-30 DIAGNOSIS — R82998 Other abnormal findings in urine: Secondary | ICD-10-CM | POA: Diagnosis not present

## 2020-05-30 DIAGNOSIS — K760 Fatty (change of) liver, not elsewhere classified: Secondary | ICD-10-CM | POA: Diagnosis not present

## 2020-06-03 DIAGNOSIS — I10 Essential (primary) hypertension: Secondary | ICD-10-CM | POA: Diagnosis not present

## 2020-06-03 DIAGNOSIS — K219 Gastro-esophageal reflux disease without esophagitis: Secondary | ICD-10-CM | POA: Diagnosis not present

## 2020-06-03 DIAGNOSIS — K222 Esophageal obstruction: Secondary | ICD-10-CM | POA: Diagnosis not present

## 2020-06-03 DIAGNOSIS — K449 Diaphragmatic hernia without obstruction or gangrene: Secondary | ICD-10-CM | POA: Diagnosis not present

## 2020-06-03 DIAGNOSIS — K317 Polyp of stomach and duodenum: Secondary | ICD-10-CM | POA: Diagnosis not present

## 2020-06-05 DIAGNOSIS — L299 Pruritus, unspecified: Secondary | ICD-10-CM | POA: Diagnosis not present

## 2020-06-05 DIAGNOSIS — H903 Sensorineural hearing loss, bilateral: Secondary | ICD-10-CM | POA: Diagnosis not present

## 2020-06-06 ENCOUNTER — Telehealth: Payer: Self-pay

## 2020-06-06 NOTE — Telephone Encounter (Signed)
Patient calling regarding prior authorization for prometrium. The current prior authorization expires 12/31 and she would like nurse to call [facility] to give them new authorization. [place]

## 2020-06-09 NOTE — Telephone Encounter (Signed)
See letter from [facility] in media dated 02/06/20.  PA for Prometrium approved 08/17/19 -08/15/20. Spoke with patient, no changes in current insurance plan.  Explained to patient, unless there is changes to her insurance plan, will need to submit after current PA expires on 08/15/20.  Patient states she plans to transfer her care to [place] at [facility] with Dr. Sabra Heck. Advised once she transfers her care she will need to contact that location to complete PA.  Questions answered.  Routing to provider for final review. Patient is agreeable to disposition. Will close encounter.

## 2020-06-11 DIAGNOSIS — R590 Localized enlarged lymph nodes: Secondary | ICD-10-CM | POA: Diagnosis not present

## 2020-06-11 DIAGNOSIS — R918 Other nonspecific abnormal finding of lung field: Secondary | ICD-10-CM | POA: Diagnosis not present

## 2020-06-11 DIAGNOSIS — J432 Centrilobular emphysema: Secondary | ICD-10-CM | POA: Diagnosis not present

## 2020-06-11 DIAGNOSIS — K449 Diaphragmatic hernia without obstruction or gangrene: Secondary | ICD-10-CM | POA: Diagnosis not present

## 2020-06-13 DIAGNOSIS — R1319 Other dysphagia: Secondary | ICD-10-CM | POA: Diagnosis not present

## 2020-06-13 DIAGNOSIS — R198 Other specified symptoms and signs involving the digestive system and abdomen: Secondary | ICD-10-CM | POA: Diagnosis not present

## 2020-06-16 ENCOUNTER — Other Ambulatory Visit: Payer: Self-pay | Admitting: Obstetrics & Gynecology

## 2020-06-16 DIAGNOSIS — Z1231 Encounter for screening mammogram for malignant neoplasm of breast: Secondary | ICD-10-CM

## 2020-06-19 DIAGNOSIS — H5211 Myopia, right eye: Secondary | ICD-10-CM | POA: Diagnosis not present

## 2020-06-19 DIAGNOSIS — Z961 Presence of intraocular lens: Secondary | ICD-10-CM | POA: Diagnosis not present

## 2020-06-24 DIAGNOSIS — R911 Solitary pulmonary nodule: Secondary | ICD-10-CM | POA: Diagnosis not present

## 2020-06-24 DIAGNOSIS — R918 Other nonspecific abnormal finding of lung field: Secondary | ICD-10-CM | POA: Diagnosis not present

## 2020-06-25 DIAGNOSIS — M1712 Unilateral primary osteoarthritis, left knee: Secondary | ICD-10-CM | POA: Diagnosis not present

## 2020-07-03 DIAGNOSIS — M1712 Unilateral primary osteoarthritis, left knee: Secondary | ICD-10-CM | POA: Diagnosis not present

## 2020-07-14 ENCOUNTER — Telehealth: Payer: Self-pay | Admitting: Emergency Medicine

## 2020-07-14 DIAGNOSIS — M1712 Unilateral primary osteoarthritis, left knee: Secondary | ICD-10-CM | POA: Diagnosis not present

## 2020-07-15 NOTE — Telephone Encounter (Signed)
I have cancelled this appointment & notified the patient.  Nothing further needed at this time.

## 2020-07-15 NOTE — Telephone Encounter (Signed)
Patient states CT scan needs to be cancelled with Imaging and Insurance. Patient phone number is [phone].

## 2020-07-16 ENCOUNTER — Other Ambulatory Visit: Payer: Self-pay

## 2020-07-16 ENCOUNTER — Ambulatory Visit
Admission: RE | Admit: 2020-07-16 | Discharge: 2020-07-16 | Disposition: A | Discharge status: Home / Self Care | Payer: Medicare HMO | Source: Ambulatory Visit | Attending: Obstetrics & Gynecology | Admitting: Obstetrics & Gynecology

## 2020-07-16 DIAGNOSIS — Z1231 Encounter for screening mammogram for malignant neoplasm of breast: Secondary | ICD-10-CM | POA: Diagnosis not present

## 2020-07-18 ENCOUNTER — Ambulatory Visit: Payer: Medicare HMO | Admitting: Obstetrics & Gynecology

## 2020-07-18 DIAGNOSIS — I10 Essential (primary) hypertension: Secondary | ICD-10-CM | POA: Diagnosis not present

## 2020-07-18 DIAGNOSIS — Z01818 Encounter for other preprocedural examination: Secondary | ICD-10-CM | POA: Diagnosis not present

## 2020-07-18 DIAGNOSIS — K219 Gastro-esophageal reflux disease without esophagitis: Secondary | ICD-10-CM | POA: Diagnosis not present

## 2020-07-18 DIAGNOSIS — E785 Hyperlipidemia, unspecified: Secondary | ICD-10-CM | POA: Diagnosis not present

## 2020-07-23 ENCOUNTER — Ambulatory Visit: Payer: Medicare HMO

## 2020-07-23 DIAGNOSIS — Z20822 Contact with and (suspected) exposure to covid-19: Secondary | ICD-10-CM | POA: Diagnosis not present

## 2020-07-23 DIAGNOSIS — R918 Other nonspecific abnormal finding of lung field: Secondary | ICD-10-CM | POA: Diagnosis not present

## 2020-07-23 DIAGNOSIS — Z01812 Encounter for preprocedural laboratory examination: Secondary | ICD-10-CM | POA: Diagnosis not present

## 2020-07-23 DIAGNOSIS — Z01818 Encounter for other preprocedural examination: Secondary | ICD-10-CM | POA: Diagnosis not present

## 2020-07-25 ENCOUNTER — Other Ambulatory Visit: Payer: Medicare HMO

## 2020-07-25 DIAGNOSIS — Z79899 Other long term (current) drug therapy: Secondary | ICD-10-CM | POA: Diagnosis not present

## 2020-07-25 DIAGNOSIS — C3490 Malignant neoplasm of unspecified part of unspecified bronchus or lung: Secondary | ICD-10-CM | POA: Diagnosis not present

## 2020-07-25 DIAGNOSIS — R918 Other nonspecific abnormal finding of lung field: Secondary | ICD-10-CM | POA: Diagnosis not present

## 2020-07-25 DIAGNOSIS — I10 Essential (primary) hypertension: Secondary | ICD-10-CM | POA: Diagnosis not present

## 2020-07-25 DIAGNOSIS — R911 Solitary pulmonary nodule: Secondary | ICD-10-CM | POA: Diagnosis not present

## 2020-07-25 DIAGNOSIS — Z9889 Other specified postprocedural states: Secondary | ICD-10-CM | POA: Diagnosis not present

## 2020-07-25 DIAGNOSIS — C3412 Malignant neoplasm of upper lobe, left bronchus or lung: Secondary | ICD-10-CM | POA: Diagnosis not present

## 2020-08-04 DIAGNOSIS — Z1212 Encounter for screening for malignant neoplasm of rectum: Secondary | ICD-10-CM | POA: Diagnosis not present

## 2020-08-05 DIAGNOSIS — C3492 Malignant neoplasm of unspecified part of left bronchus or lung: Secondary | ICD-10-CM | POA: Diagnosis not present

## 2020-08-05 DIAGNOSIS — R918 Other nonspecific abnormal finding of lung field: Secondary | ICD-10-CM | POA: Diagnosis not present

## 2020-08-07 DIAGNOSIS — C3490 Malignant neoplasm of unspecified part of unspecified bronchus or lung: Secondary | ICD-10-CM | POA: Diagnosis not present

## 2020-08-16 DIAGNOSIS — C801 Malignant (primary) neoplasm, unspecified: Secondary | ICD-10-CM

## 2020-08-16 HISTORY — DX: Malignant (primary) neoplasm, unspecified: C80.1

## 2020-08-16 HISTORY — PX: LUNG REMOVAL, PARTIAL: SHX233

## 2020-08-18 DIAGNOSIS — C3490 Malignant neoplasm of unspecified part of unspecified bronchus or lung: Secondary | ICD-10-CM | POA: Diagnosis not present

## 2020-08-26 DIAGNOSIS — R918 Other nonspecific abnormal finding of lung field: Secondary | ICD-10-CM | POA: Diagnosis not present

## 2020-08-26 DIAGNOSIS — Z79899 Other long term (current) drug therapy: Secondary | ICD-10-CM | POA: Diagnosis not present

## 2020-08-26 DIAGNOSIS — C3492 Malignant neoplasm of unspecified part of left bronchus or lung: Secondary | ICD-10-CM | POA: Diagnosis not present

## 2020-09-02 DIAGNOSIS — R918 Other nonspecific abnormal finding of lung field: Secondary | ICD-10-CM | POA: Diagnosis not present

## 2020-09-04 DIAGNOSIS — I9789 Other postprocedural complications and disorders of the circulatory system, not elsewhere classified: Secondary | ICD-10-CM | POA: Diagnosis not present

## 2020-09-04 DIAGNOSIS — D509 Iron deficiency anemia, unspecified: Secondary | ICD-10-CM | POA: Diagnosis not present

## 2020-09-04 DIAGNOSIS — I480 Paroxysmal atrial fibrillation: Secondary | ICD-10-CM | POA: Diagnosis not present

## 2020-09-04 DIAGNOSIS — I2510 Atherosclerotic heart disease of native coronary artery without angina pectoris: Secondary | ICD-10-CM | POA: Diagnosis not present

## 2020-09-04 DIAGNOSIS — Y836 Removal of other organ (partial) (total) as the cause of abnormal reaction of the patient, or of later complication, without mention of misadventure at the time of the procedure: Secondary | ICD-10-CM | POA: Diagnosis not present

## 2020-09-04 DIAGNOSIS — K219 Gastro-esophageal reflux disease without esophagitis: Secondary | ICD-10-CM | POA: Diagnosis not present

## 2020-09-04 DIAGNOSIS — J95811 Postprocedural pneumothorax: Secondary | ICD-10-CM | POA: Diagnosis not present

## 2020-09-04 DIAGNOSIS — I10 Essential (primary) hypertension: Secondary | ICD-10-CM | POA: Diagnosis not present

## 2020-09-04 DIAGNOSIS — C3492 Malignant neoplasm of unspecified part of left bronchus or lung: Secondary | ICD-10-CM | POA: Diagnosis not present

## 2020-09-04 DIAGNOSIS — E785 Hyperlipidemia, unspecified: Secondary | ICD-10-CM | POA: Diagnosis not present

## 2020-09-04 DIAGNOSIS — C3412 Malignant neoplasm of upper lobe, left bronchus or lung: Secondary | ICD-10-CM | POA: Diagnosis not present

## 2020-09-06 DIAGNOSIS — C3492 Malignant neoplasm of unspecified part of left bronchus or lung: Secondary | ICD-10-CM | POA: Diagnosis not present

## 2020-09-08 ENCOUNTER — Ambulatory Visit: Payer: Medicare HMO | Admitting: Interventional Cardiology

## 2020-09-09 ENCOUNTER — Telehealth: Payer: Self-pay | Admitting: Internal Medicine

## 2020-09-09 NOTE — Telephone Encounter (Signed)
Ms. Marissa Jacobs called in with systolic blood pressures in the 180-200 mmHg range for much of the day. She denies any associated chest pain, exertional chest pressure/discomfort, dyspnea/tachypnea, paroxysmal nocturnal dyspnea/orthopnea, irregular heart beat/palpitations, presyncope/syncope, lower extremity edema, claudication, or abdominal distention. 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 Recommended that patient go in to the ED for further evaluation as she mentioned that she was worried about going to sleep with such high blood pressures. Patient and her son voiced understanding.

## 2020-09-10 ENCOUNTER — Emergency Department (HOSPITAL_COMMUNITY)
Admission: EM | Admit: 2020-09-10 | Discharge: 2020-09-10 | Disposition: A | Discharge status: Home / Self Care | Payer: Medicare HMO | Attending: Emergency Medicine | Admitting: Emergency Medicine

## 2020-09-10 ENCOUNTER — Encounter (HOSPITAL_COMMUNITY): Payer: Self-pay

## 2020-09-10 ENCOUNTER — Telehealth: Payer: Self-pay | Admitting: Interventional Cardiology

## 2020-09-10 ENCOUNTER — Emergency Department (HOSPITAL_COMMUNITY): Payer: Medicare HMO

## 2020-09-10 DIAGNOSIS — Z79899 Other long term (current) drug therapy: Secondary | ICD-10-CM | POA: Diagnosis not present

## 2020-09-10 DIAGNOSIS — I10 Essential (primary) hypertension: Secondary | ICD-10-CM | POA: Diagnosis not present

## 2020-09-10 DIAGNOSIS — I4891 Unspecified atrial fibrillation: Secondary | ICD-10-CM | POA: Diagnosis not present

## 2020-09-10 DIAGNOSIS — Z483 Aftercare following surgery for neoplasm: Secondary | ICD-10-CM | POA: Diagnosis not present

## 2020-09-10 DIAGNOSIS — J90 Pleural effusion, not elsewhere classified: Secondary | ICD-10-CM | POA: Diagnosis not present

## 2020-09-10 DIAGNOSIS — J9811 Atelectasis: Secondary | ICD-10-CM | POA: Diagnosis not present

## 2020-09-10 DIAGNOSIS — Z87891 Personal history of nicotine dependence: Secondary | ICD-10-CM | POA: Insufficient documentation

## 2020-09-10 DIAGNOSIS — R030 Elevated blood-pressure reading, without diagnosis of hypertension: Secondary | ICD-10-CM | POA: Diagnosis present

## 2020-09-10 DIAGNOSIS — C3412 Malignant neoplasm of upper lobe, left bronchus or lung: Secondary | ICD-10-CM | POA: Diagnosis not present

## 2020-09-10 DIAGNOSIS — K219 Gastro-esophageal reflux disease without esophagitis: Secondary | ICD-10-CM | POA: Diagnosis not present

## 2020-09-10 LAB — CBC
HCT: 35.4 % — ABNORMAL LOW (ref 36.0–46.0)
Hemoglobin: 11.9 g/dL — ABNORMAL LOW (ref 12.0–15.0)
MCH: 30.1 pg (ref 26.0–34.0)
MCHC: 33.6 g/dL (ref 30.0–36.0)
MCV: 89.4 fL (ref 80.0–100.0)
Platelets: 348 10*3/uL (ref 150–400)
RBC: 3.96 MIL/uL (ref 3.87–5.11)
RDW: 13 % (ref 11.5–15.5)
WBC: 8.3 10*3/uL (ref 4.0–10.5)
nRBC: 0 % (ref 0.0–0.2)

## 2020-09-10 LAB — BASIC METABOLIC PANEL
Anion gap: 11 (ref 5–15)
BUN: 11 mg/dL (ref 8–23)
CO2: 27 mmol/L (ref 22–32)
Calcium: 8.9 mg/dL (ref 8.9–10.3)
Chloride: 97 mmol/L — ABNORMAL LOW (ref 98–111)
Creatinine, Ser: 0.59 mg/dL (ref 0.44–1.00)
GFR, Estimated: >60 mL/min (ref >60)
Glucose, Bld: 108 mg/dL — ABNORMAL HIGH (ref 70–99)
Potassium: 3.6 mmol/L (ref 3.5–5.1)
Sodium: 135 mmol/L (ref 135–145)

## 2020-09-10 MED ORDER — POTASSIUM CHLORIDE CRYS ER 20 MEQ PO TBCR
40.0000 meq | EXTENDED_RELEASE_TABLET | Freq: Once | ORAL | Status: AC
  Administered 2020-09-10: 40 meq via ORAL
  Filled 2020-09-10: qty 2

## 2020-09-10 NOTE — ED Triage Notes (Signed)
Pt was discharged from [facility] yesterday after having a wedge resection of her lung, she also was dx with new onset afib and she was started on metoprolol  Tonight she says that her blood pressure was elevated and she became nervous Hank Tamala Julian is her cardiologist that told her to come to the ED to get checked

## 2020-09-10 NOTE — Telephone Encounter (Signed)
Pt c/o BP issue: STAT if pt c/o blurred vision, one-sided weakness or slurred speech  1. What are your last 5 BP readings?  09/10/20: 10:30 AM: 145/75 *3 readings taken in hospital on 09/10/20 (overnight) 194/89 182/69 174/76  At home machine:  09/09/2020 - 5 pm : 204/89  2. Are you having any other symptoms (ex. Dizziness, headache, blurred vision, passed out)?  No other symptoms   3. What is your BP issue?  Went to hospital last night to be evaluated because her BP went over 200 quite a few times and she was concerned going to sleep with BP that high. She would like to see Dr. Tamala Julian today or asap for follow up  - earliest appointment available with all care teams was tomorrow with Robbie Lis at 3:45 PM. Hospital discharge notes call for follow up with cardio. She states she would prefer to see Dr. Tamala Julian and is can come in anytime at all today/tomorrow. Please advise  Thank you!

## 2020-09-10 NOTE — ED Provider Notes (Signed)
[place] DEPT Provider Note   CSN: [id number] Arrival date & time: 09/10/20  0002   History Chief complaint: Elevated blood pressure  Marissa Jacobs is a 85 y.o. female.  The history is provided by the patient.  She has history of hypertension, hyperlipidemia, lung cancer status post wedge resection and comes in because of elevated blood pressure at home. She was admitted to [place] 1/20 through 1/24 for wedge resection of a lung cancer. She had an episode of atrial fibrillation in the postoperative period. She was placed on metoprolol for rate control and discharged. Tonight, she checked her blood pressure and it was elevated. She took her blood pressure multiple times and it was as high as 214/89. At no time was her systolic blood pressure lower than 184. She called her cardiologist's office who recommended that she come to the emergency department. She is very concerned about her episode of atrial fibrillation because she was not aware of her heart beating fast. She denies chest pain, heaviness, tightness, pressure. She denies headache, tinnitus, epistaxis.  Past Medical History:  Diagnosis Date  . Anemia    secondary to taking omeprazole-on iron  . Diverticulosis 2/04  . GERD (gastroesophageal reflux disease)   . Hyperlipidemia   . Hypertension   . Lung nodule   . Osteopenia   . Post traumatic stress disorder   . STD (sexually transmitted disease)    HSV    Patient Active Problem List   Diagnosis Date Noted  . Pulmonary nodules/lesions, multiple 05/05/2020  . Bilateral hearing loss 05/29/2019  . Osteoarthritis of knee 06/27/2018  . Atypical chest pain 05/02/2018  . Hypertensive disorder 05/02/2018  . GERD with stricture 11/01/2014  . Hyperlipidemia 07/28/2012    Past Surgical History:  Procedure Laterality Date  . APPENDECTOMY    . CERVICAL BIOPSY  W/ LOOP ELECTRODE EXCISION     CIN1  . COLPOSCOPY    . ESOPHAGEAL DILATION  2013   . FACIAL COSMETIC SURGERY  9/03  . KNEE SURGERY    . TONSILLECTOMY       OB History    Gravida  2   Para  1   Term  1   Preterm      AB  1   Living  1     SAB      IAB      Ectopic      Multiple      Live Births              Family History  Problem Relation Age of Onset  . COPD Mother   . Sudden death Father 1  . Healthy Brother     Social History   Tobacco Use  . Smoking status: Former Smoker    Quit date: 02/21/1963    Years since quitting: 57.5  . Smokeless tobacco: Never Used  . Tobacco comment: in college  Vaping Use  . Vaping Use: Never used  Substance Use Topics  . Alcohol use: Yes    Alcohol/week: 5.0 standard drinks    Types: 5 Standard drinks or equivalent per week  . Drug use: No    Home Medications Prior to Admission medications   Medication Sig Start Date End Date Taking? Authorizing Provider  CALCIUM PO Take 600 mg by mouth 2 (two) times daily.     [provider]  Cholecalciferol (VITAMIN D PO) Take 2,500 mg by mouth every other day.     [provider]  Coenzyme Q10 (CO Q 10 PO) Take by mouth daily.    [provider]  EPIPEN 2-PAK 0.3 MG/0.3ML SOAJ injection  11/05/16   [provider]  estradiol (VIVELLE-DOT) 0.025 MG/24HR Place 1 patch onto the skin 2 (two) times a week. 05/29/20   Megan Salon, MD  fexofenadine (ALLEGRA) 180 MG tablet Take 180 mg by mouth once a week.    [provider]  folic acid (FOLVITE) 387 MCG tablet Take 400 mcg by mouth daily.    [provider]  hydrochlorothiazide (MICROZIDE) 12.5 MG capsule Take 12.5 mg by mouth daily.     [provider]  losartan (COZAAR) 25 MG tablet Take 25 mg by mouth daily. 04/23/20   [provider]  Magnesium 300 MG CAPS Take 300 mg by mouth daily.    [provider]  Omega-3 Fatty Acids (FISH OIL PO) Take 600 mg by mouth daily.     [provider]  omeprazole (PRILOSEC) 10 MG capsule  Take 10 mg by mouth daily. 04/04/16   [provider]  PROMETRIUM 100 MG capsule Take 1 capsule (100 mg total) by mouth daily. 05/29/20   Megan Salon, MD  tretinoin (RETIN-A) 0.025 % cream Apply 1 application topically at bedtime.  02/05/13   [provider]  PROMETRIUM 100 MG capsule Take 1 capsule (100 mg total) by mouth daily. 05/23/19 05/29/20  Megan Salon, MD    Allergies    Abobotulinumtoxina, Codeine, and Amoxicillin  Review of Systems   Review of Systems  All other systems reviewed and are negative.   Physical Exam Updated Vital Signs BP (!) 195/85 (BP Location: Left Arm)   Pulse 72   Temp 98.3 F (36.8 C) (Oral)   Resp 16   LMP 08/16/1998   SpO2 97%   Physical Exam Vitals and nursing note reviewed.   85 year old female, resting comfortably and in no acute distress. Vital signs are significant for elevated blood pressure. Oxygen saturation is 97%, which is normal. Head is normocephalic and atraumatic. PERRLA, EOMI. Oropharynx is clear. Neck is nontender and supple without adenopathy or JVD. Back is nontender and there is no CVA tenderness. Lungs are clear without rales, wheezes, or rhonchi. Chest is nontender. Heart has regular rate and rhythm without murmur. Abdomen is soft, flat, nontender without masses or hepatosplenomegaly and peristalsis is normoactive. Extremities have no cyanosis or edema, full range of motion is present. Skin is warm and dry without rash. Neurologic: Mental status is normal, cranial nerves are intact, there are no motor or sensory deficits.  ED Results / Procedures / Treatments   Labs (all labs ordered are listed, but only abnormal results are displayed) Labs Reviewed  BASIC METABOLIC PANEL - Abnormal; Notable for the following components:      Result Value   Chloride 97 (*)    Glucose, Bld 108 (*)    All other components within normal limits  CBC - Abnormal; Notable for the following components:   Hemoglobin 11.9  (*)    HCT 35.4 (*)    All other components within normal limits    EKG EKG Interpretation  Date/Time:  Wednesday September 10 2020 01:03:08 EST Ventricular Rate:  68 PR Interval:    QRS Duration: 87 QT Interval:  410 QTC Calculation: 436 R Axis:   65 Text Interpretation: Sinus rhythm Normal ECG No old tracing to compare Confirmed by Delora Fuel ([id number]) on 09/10/2020 1:06:18 AM  Radiology DG Chest 2 View  Result Date: 09/10/2020 CLINICAL DATA:  New onset atrial fibrillation EXAM: CHEST - 2 VIEW COMPARISON:  CT chest dated 04/18/2020. FINDINGS: The previously demonstrated ground-glass airspace opacity in the left upper lobe is not well appreciated and may be surgically absent. There is blunting of the left costophrenic angle, new since prior study. There is a possible trace right-sided pleural effusion. There is no pneumothorax. There is subcutaneous gas along the patient's low left flank. There is no acute osseous abnormality. Degenerative changes are noted of the thoracolumbar spine. The heart size is stable. Aortic calcifications are noted. IMPRESSION: 1. Postsurgical changes related to wedge resection of the left upper lobe. 2. Blunting of the left costophrenic angle favored to represent a combination of a small left-sided pleural effusion and postoperative atelectasis. 3. No pneumothorax. 4. Subcutaneous gas is noted along the patient's left flank, likely postsurgical in etiology. Electronically Signed   By: Constance Holster M.D.   On: 09/10/2020 01:24    Procedures Procedures   Medications Ordered in ED Medications  potassium chloride SA (KLOR-CON) CR tablet 40 mEq (has no administration in time range)    ED Course  I have reviewed the triage vital signs and the nursing notes.  Pertinent labs & imaging results that were available during my care of the patient were reviewed by me and considered in my medical decision making (see chart for details).  MDM  Rules/Calculators/A&P Elevated blood pressure without evidence of endorgan damage. Cardiac monitor shows sinus rhythm. ECG is normal. Chest x-ray shows postoperative changes. Labs show borderline anemia and low normal potassium. She is given a dose of oral potassium. In the ED, blood pressure has come down although not to normal range. However, it is not in a range which is dangerous in the short run. Old records are reviewed confirming recent hospitalization at [facility] with episode of atrial fibrillation in the postoperative period. Patient is reassured that her blood pressure is not so high that she is in danger of having a stroke in the next several days, no evidence that she has had recurrent atrial fibrillation. She is referred back to her cardiologist. I have discussed with her possibly getting an app for her phone that would let her screen for atrial fibrillation.  Final Clinical Impression(s) / ED Diagnoses Final diagnoses:  Elevated blood pressure reading with diagnosis of hypertension    Rx / DC Orders ED Discharge Orders    None       Delora Fuel, MD 31/49/70 [phone]

## 2020-09-10 NOTE — Telephone Encounter (Signed)
Spoke with pt and advised that I do not have any openings today or tomorrow with Dr. Tamala Julian.  Advised to keep appt with Vin tomorrow for evaluation.  Pt agreeable to plan.  Denies any symptoms.  Pt very anxious about BP becoming elevated suddenly since her recent surgery at [facility].  Pt mentioned she had episodes of Atrial fib post surgery.

## 2020-09-10 NOTE — Discharge Instructions (Signed)
Please continue to check your blood pressure once or twice a day. Keep a record of your blood pressure readings and take that record with you when you see your primary care provider or your cardiologist.  Talk with your cardiologist about whether it would be advisable to get an app for your phone that can help you tell if you are going back into atrial fibrillation.  Return to the emergency department if you are having any problems.

## 2020-09-11 ENCOUNTER — Encounter: Payer: Self-pay | Admitting: Physician Assistant

## 2020-09-11 ENCOUNTER — Other Ambulatory Visit: Payer: Self-pay

## 2020-09-11 ENCOUNTER — Ambulatory Visit: Payer: Medicare HMO | Admitting: Physician Assistant

## 2020-09-11 VITALS — BP 120/60 | HR 80 | Ht 61.0 in | Wt 142.2 lb

## 2020-09-11 DIAGNOSIS — I480 Paroxysmal atrial fibrillation: Secondary | ICD-10-CM | POA: Diagnosis not present

## 2020-09-11 DIAGNOSIS — I10 Essential (primary) hypertension: Secondary | ICD-10-CM

## 2020-09-11 DIAGNOSIS — I2510 Atherosclerotic heart disease of native coronary artery without angina pectoris: Secondary | ICD-10-CM

## 2020-09-11 NOTE — Progress Notes (Signed)
Cardiology Office Note:    Date:  09/11/2020   ID:  Marissa Jacobs, DOB Feb 11, 1936, MRN [id number]  PCP:  Shon Baton, MD  [facility] Cardiologist: Dr. Tamala Julian   Chief Complaint: hospital follow up and elevated BP  History of Present Illness:    Marissa Jacobs is a 85 y.o. female with a hx of HTN, lung cancer s/p left upper lobe posterior segmentectomy  January 2022 at [facility] patient here for follow-up.  Patient is followed by Dr. Tamala Julian for chest pain which like atypical in nature.  Coronary CTA with calcium score of 97.2.  43 percentile.  Mild, calcified, nonobstructive disease in the proximal to mid LAD. Also note to have 1.8 cm focal ground-glass opacity in superior segment of right lower lobe.   Repeat scans showed 2.3 cm LUL mixed solid and groundglass nodules s/p EBUS showing adenocarcinoma of the nodules.  We followed at [facility] and underwent robotic assisted left upper lobe posterior segmentectomy and mediastinal lymph node dissection.  On postop day 2 patient developing atrial fibrillation and placed on amiodarone drip.  Successfully converted to sinus rhythm.  Started on metoprolol and stopped amiodarone.  Seen in ER yesterday for hypertensive urgency.   Here today for follow up with son. Office visit blood pressure reading is 120/60. Home blood pressure cuff was reading 20 point more. Patient and son stated>>why no one ordered chest CT scan sooner than 6 months despite nodules on coronary CTA. Discussed CT of coronary result and recommendation was for 72-month follow-up. Patient is a non-smoker. Has multiple questions, all answered. She denies palpitation, dizziness, orthopnea, PND, syncope, lower extremity edema or melena.  Past Medical History:  Diagnosis Date  . Anemia    secondary to taking omeprazole-on iron  . Diverticulosis 2/04  . GERD (gastroesophageal reflux disease)   . Hyperlipidemia   . Hypertension   . Lung nodule   . Osteopenia   . Post traumatic stress disorder    . STD (sexually transmitted disease)    HSV    Past Surgical History:  Procedure Laterality Date  . APPENDECTOMY    . CERVICAL BIOPSY  W/ LOOP ELECTRODE EXCISION     CIN1  . COLPOSCOPY    . ESOPHAGEAL DILATION  2013  . FACIAL COSMETIC SURGERY  9/03  . KNEE SURGERY    . TONSILLECTOMY      Current Medications: Current Meds  Medication Sig  . acetaminophen (TYLENOL) 500 MG tablet Take by mouth.  . Ascorbic Acid (VITAMIN C) 1000 MG tablet 1 tablet  . bifidobacterium infantis (ALIGN) capsule Take by mouth.  Marland Kitchen CALCIUM PO Take 600 mg by mouth 2 (two) times daily.   . Cholecalciferol (VITAMIN D PO) Take 2,500 mg by mouth every other day.   . Cholecalciferol 125 MCG (5000 UT) TABS Take by mouth.  . Coenzyme Q10 (CO Q 10 PO) Take by mouth daily.  Marland Kitchen EPIPEN 2-PAK 0.3 MG/0.3ML SOAJ injection   . estradiol (VIVELLE-DOT) 0.025 MG/24HR Place 1 patch onto the skin 2 (two) times a week.  . fexofenadine (ALLEGRA) 180 MG tablet Take 180 mg by mouth once a week.  . folic acid (FOLVITE) 983 MCG tablet Take 400 mcg by mouth daily.  . Grape Seed Extract 100 MG CAPS 1 capsule  . losartan (COZAAR) 25 MG tablet Take 25 mg by mouth daily.  . Magnesium 300 MG CAPS Take 300 mg by mouth daily.  . metoprolol tartrate (LOPRESSOR) 25 MG tablet Take by mouth.  Marland Kitchen  Omega-3 Fatty Acids (FISH OIL PO) Take 600 mg by mouth daily.   Marland Kitchen omeprazole (PRILOSEC) 10 MG capsule Take 10 mg by mouth daily.  Marland Kitchen omeprazole (PRILOSEC) 10 MG capsule Take 10 mg by mouth 2 (two) times daily.  Marland Kitchen PROMETRIUM 100 MG capsule Take 1 capsule (100 mg total) by mouth daily.  Marland Kitchen pyridOXINE (VITAMIN B-6) 100 MG tablet 1 tablet  . Quercetin 50 MG TABS Take by mouth.  . tretinoin (RETIN-A) 0.025 % cream Apply 1 application topically at bedtime.   Marland Kitchen zinc gluconate 50 MG tablet Take by mouth.  . [DISCONTINUED] hydrochlorothiazide (MICROZIDE) 12.5 MG capsule Take 12.5 mg by mouth daily.      Allergies:   Abobotulinumtoxina, Codeine, and  Amoxicillin   Social History   Socioeconomic History  . Marital status: Divorced    Spouse name: Not on file  . Number of children: Not on file  . Years of education: Not on file  . Highest education level: Not on file  Occupational History  . Not on file  Tobacco Use  . Smoking status: Former Smoker    Quit date: 02/21/1963    Years since quitting: 57.5  . Smokeless tobacco: Never Used  . Tobacco comment: in college  Vaping Use  . Vaping Use: Never used  Substance and Sexual Activity  . Alcohol use: Yes    Alcohol/week: 5.0 standard drinks    Types: 5 Standard drinks or equivalent per week  . Drug use: No  . Sexual activity: Yes    Partners: Male    Birth control/protection: Post-menopausal  Other Topics Concern  . Not on file  Social History Narrative  . Not on file   Social Determinants of Health   Financial Resource Strain: Not on file  Food Insecurity: Not on file  Transportation Needs: Not on file  Physical Activity: Not on file  Stress: Not on file  Social Connections: Not on file     Family History: The patient's family history includes COPD in her mother; Healthy in her brother; Sudden death (age of onset: 2) in her father.    ROS:   Please see the history of present illness.    All other systems reviewed and are negative.   EKGs/Labs/Other Studies Reviewed:    The following studies were reviewed today:  Coronary CTA 09/13/2019 IMPRESSION: 1. Coronary calcium score of 97.2. This was 43rd percentile for age and sex matched control.  2. Normal coronary origin with right dominance.  3. Mild, calcified, non obstructive disease in the proximal to mid LAD.  4. CAD-RADS 2. Mild non-obstructive CAD (25-49%). Consider non-atherosclerotic causes of chest pain. Consider preventive therapy and risk factor modification.    EKG:  EKG is not  ordered today.  The ekg done 09/10/20 demonstrates NSR  Risk Assessment/Calculations:    CHA2DS2-VASc Score  = 4   The patient's score is based upon: CHF History: No HTN History: Yes Diabetes History: No Stroke History: No Vascular Disease History: No Age Score: 2 Gender Score: 1    Physical Exam:    VS:  BP 120/60   Pulse 80   Ht 5\' 1"  (1.549 m)   Wt 142 lb 3.2 oz (64.5 kg)   LMP 08/16/1998   SpO2 98%   BMI 26.87 kg/m     Wt Readings from Last 3 Encounters:  09/11/20 142 lb 3.2 oz (64.5 kg)  05/29/20 140 lb (63.5 kg)  05/05/20 140 lb 9.6 oz (63.8 kg)  GEN: Well nourished, well developed in no acute distress HEENT: Normal NECK: No JVD; No carotid bruits LYMPHATICS: No lymphadenopathy CARDIAC: RRR, no murmurs, rubs, gallops RESPIRATORY:  Clear to auscultation without rales, wheezing or rhonchi  ABDOMEN: Soft, non-tender, non-distended MUSCULOSKELETAL:  No edema; No deformity  SKIN: Warm and dry NEUROLOGIC:  Alert and oriented x 3 PSYCHIATRIC:  Normal affect   ASSESSMENT AND PLAN:    1. HTN Blood pressure stable during office visit. Patient has prenotions about medications. Recommended to continue metoprolol and restart losartan. Hold hydrochlorothiazide and she will restart only if systolic blood pressure above 160. Keep track of blood pressure. Get new blood pressure cuff. Send [place] readings for review.  2. Non obstructive CTA by coronary CTA -No angina.  3. Lung cancer s/p  left upper lobe posterior segmentectomy 08/2020 -Per surgeon.  4. Postop atrial fibrillation -Sinus rhythm by exam and by EKG yesterday. Continue metoprolol. If recurrent episode of A. fib, patient will need anticoagulation.  Son and patient both were in agreement about medications as well as nodules finding on coronary CT. Explained results in detail. Discussed importance of beta-blocker to keep rhythm/rate stable. Patient would like to simplify her regimen. In future consider uptitrating one medication before adding another agent. Suspect underlying stress/anxiety. I have spent at least 45  minutes discussing above + 15 minutes review of chart and recent admission.    Medication Adjustments/Labs and Tests Ordered: Current medicines are reviewed at length with the patient today.  Concerns regarding medicines are outlined above.  No orders of the defined types were placed in this encounter.  No orders of the defined types were placed in this encounter.   Patient Instructions  Medication Instructions:  Your physician has recommended you make the following change in your medication:  1.  CONTINUE Metoprolol 12.5 mg twice a day 2.  RESTART Losartan 25 mg taking 1 tablet daily 3.  STOP Hydrochlorothiazide 12.5    *If you need a refill on your cardiac medications before your next appointment, please call your pharmacy*   Lab Work: None ordered  If you have labs (blood work) drawn today and your tests are completely normal, you will receive your results only by: Marland Kitchen MyChart Message (if you have MyChart) OR . A paper copy in the mail If you have any lab test that is abnormal or we need to change your treatment, we will call you to review the results.   Testing/Procedures: None ordered   Follow-Up: At [facility], you and your health needs are our priority.  As part of our continuing mission to provide you with exceptional heart care, we have created designated Provider Care Teams.  These Care Teams include your primary Cardiologist (physician) and Advanced Practice Providers (APPs -  Physician Assistants and Nurse Practitioners) who all work together to provide you with the care you need, when you need it.  We recommend signing up for the patient portal called "MyChart".  Sign up information is provided on this After Visit Summary.  MyChart is used to connect with patients for Virtual Visits (Telemedicine).  Patients are able to view lab/test results, encounter notes, upcoming appointments, etc.  Non-urgent messages can be sent to your provider as well.   To learn more  about what you can do with MyChart, go to [email or web address].    Your next appointment:   6-8 WEEKS    The format for your next appointment:   In Person  Provider:   Daneen Schick, MD  Other Instructions      Signed, Leanor Kail, [place]  09/11/2020 5:05 PM    [facility]

## 2020-09-11 NOTE — Patient Instructions (Addendum)
Medication Instructions:  Your physician has recommended you make the following change in your medication:  1.  CONTINUE Metoprolol 12.5 mg twice a day 2.  RESTART Losartan 25 mg taking 1 tablet daily 3.  STOP Hydrochlorothiazide 12.5    *If you need a refill on your cardiac medications before your next appointment, please call your pharmacy*   Lab Work: None ordered  If you have labs (blood work) drawn today and your tests are completely normal, you will receive your results only by: Marland Kitchen MyChart Message (if you have MyChart) OR . A paper copy in the mail If you have any lab test that is abnormal or we need to change your treatment, we will call you to review the results.   Testing/Procedures: None ordered   Follow-Up: At [facility], you and your health needs are our priority.  As part of our continuing mission to provide you with exceptional heart care, we have created designated Provider Care Teams.  These Care Teams include your primary Cardiologist (physician) and Advanced Practice Providers (APPs -  Physician Assistants and Nurse Practitioners) who all work together to provide you with the care you need, when you need it.  We recommend signing up for the patient portal called "MyChart".  Sign up information is provided on this After Visit Summary.  MyChart is used to connect with patients for Virtual Visits (Telemedicine).  Patients are able to view lab/test results, encounter notes, upcoming appointments, etc.  Non-urgent messages can be sent to your provider as well.   To learn more about what you can do with MyChart, go to [email or web address].    Your next appointment:   6-8 WEEKS    The format for your next appointment:   In Person  Provider:   Daneen Schick, MD   Other Instructions

## 2020-09-12 DIAGNOSIS — K219 Gastro-esophageal reflux disease without esophagitis: Secondary | ICD-10-CM | POA: Diagnosis not present

## 2020-09-12 DIAGNOSIS — C3412 Malignant neoplasm of upper lobe, left bronchus or lung: Secondary | ICD-10-CM | POA: Diagnosis not present

## 2020-09-12 DIAGNOSIS — Z483 Aftercare following surgery for neoplasm: Secondary | ICD-10-CM | POA: Diagnosis not present

## 2020-09-12 DIAGNOSIS — I10 Essential (primary) hypertension: Secondary | ICD-10-CM | POA: Diagnosis not present

## 2020-09-15 DIAGNOSIS — Z483 Aftercare following surgery for neoplasm: Secondary | ICD-10-CM | POA: Diagnosis not present

## 2020-09-15 DIAGNOSIS — I10 Essential (primary) hypertension: Secondary | ICD-10-CM | POA: Diagnosis not present

## 2020-09-15 DIAGNOSIS — K219 Gastro-esophageal reflux disease without esophagitis: Secondary | ICD-10-CM | POA: Diagnosis not present

## 2020-09-15 DIAGNOSIS — C3412 Malignant neoplasm of upper lobe, left bronchus or lung: Secondary | ICD-10-CM | POA: Diagnosis not present

## 2020-09-16 DIAGNOSIS — J90 Pleural effusion, not elsewhere classified: Secondary | ICD-10-CM | POA: Diagnosis not present

## 2020-09-16 DIAGNOSIS — J9811 Atelectasis: Secondary | ICD-10-CM | POA: Diagnosis not present

## 2020-09-16 DIAGNOSIS — C3492 Malignant neoplasm of unspecified part of left bronchus or lung: Secondary | ICD-10-CM | POA: Diagnosis not present

## 2020-09-16 DIAGNOSIS — Z85118 Personal history of other malignant neoplasm of bronchus and lung: Secondary | ICD-10-CM | POA: Diagnosis not present

## 2020-09-17 DIAGNOSIS — I10 Essential (primary) hypertension: Secondary | ICD-10-CM | POA: Diagnosis not present

## 2020-09-17 DIAGNOSIS — Z483 Aftercare following surgery for neoplasm: Secondary | ICD-10-CM | POA: Diagnosis not present

## 2020-09-17 DIAGNOSIS — C3412 Malignant neoplasm of upper lobe, left bronchus or lung: Secondary | ICD-10-CM | POA: Diagnosis not present

## 2020-09-17 DIAGNOSIS — K219 Gastro-esophageal reflux disease without esophagitis: Secondary | ICD-10-CM | POA: Diagnosis not present

## 2020-09-19 DIAGNOSIS — I10 Essential (primary) hypertension: Secondary | ICD-10-CM | POA: Diagnosis not present

## 2020-09-19 DIAGNOSIS — C3412 Malignant neoplasm of upper lobe, left bronchus or lung: Secondary | ICD-10-CM | POA: Diagnosis not present

## 2020-09-19 DIAGNOSIS — K219 Gastro-esophageal reflux disease without esophagitis: Secondary | ICD-10-CM | POA: Diagnosis not present

## 2020-09-19 DIAGNOSIS — Z483 Aftercare following surgery for neoplasm: Secondary | ICD-10-CM | POA: Diagnosis not present

## 2020-09-24 DIAGNOSIS — C3412 Malignant neoplasm of upper lobe, left bronchus or lung: Secondary | ICD-10-CM | POA: Diagnosis not present

## 2020-09-24 DIAGNOSIS — K219 Gastro-esophageal reflux disease without esophagitis: Secondary | ICD-10-CM | POA: Diagnosis not present

## 2020-09-24 DIAGNOSIS — Z483 Aftercare following surgery for neoplasm: Secondary | ICD-10-CM | POA: Diagnosis not present

## 2020-09-24 DIAGNOSIS — I10 Essential (primary) hypertension: Secondary | ICD-10-CM | POA: Diagnosis not present

## 2020-09-25 DIAGNOSIS — K219 Gastro-esophageal reflux disease without esophagitis: Secondary | ICD-10-CM | POA: Diagnosis not present

## 2020-09-25 DIAGNOSIS — C3412 Malignant neoplasm of upper lobe, left bronchus or lung: Secondary | ICD-10-CM | POA: Diagnosis not present

## 2020-09-25 DIAGNOSIS — I10 Essential (primary) hypertension: Secondary | ICD-10-CM | POA: Diagnosis not present

## 2020-09-25 DIAGNOSIS — Z483 Aftercare following surgery for neoplasm: Secondary | ICD-10-CM | POA: Diagnosis not present

## 2020-10-01 DIAGNOSIS — I10 Essential (primary) hypertension: Secondary | ICD-10-CM | POA: Diagnosis not present

## 2020-10-01 DIAGNOSIS — Z483 Aftercare following surgery for neoplasm: Secondary | ICD-10-CM | POA: Diagnosis not present

## 2020-10-01 DIAGNOSIS — C3412 Malignant neoplasm of upper lobe, left bronchus or lung: Secondary | ICD-10-CM | POA: Diagnosis not present

## 2020-10-01 DIAGNOSIS — K219 Gastro-esophageal reflux disease without esophagitis: Secondary | ICD-10-CM | POA: Diagnosis not present

## 2020-10-04 ENCOUNTER — Other Ambulatory Visit: Payer: Self-pay | Admitting: Obstetrics & Gynecology

## 2020-10-06 ENCOUNTER — Telehealth: Payer: Self-pay | Admitting: Interventional Cardiology

## 2020-10-06 MED ORDER — METOPROLOL TARTRATE 25 MG PO TABS: 12.5000 mg | ORAL_TABLET | Freq: Two times a day (BID) | ORAL | 3 refills | Status: DC

## 2020-10-06 NOTE — Telephone Encounter (Signed)
Called patient back about her message. Patient stated she had surgery on 09/04/20 to remove her left upper lobe due to a lung nodule. Patient stated while she was in the hospital she had A. FIB 2 days after surgery. Patient stated the surgeon started her on metoprolol 12.5 mg BID and then follow up with her cardiologist. Patient stated she only had a month supply and she will be out tomorrow. Patient needs to know if she needs to continue metoprolol or stop it. Informed patient that she needs to have an EKG to be evaluated and see what rhythm she is in now. Patient stated she does not think she is in A. FIB any more. Patient stated her HRs are 60's to 70's and BP have been fine (134/62, 129/71, 139/70, 153/79, 144/72). Made patient an appointment tomorrow with Truitt Merle NP to be evaluated to see if she needs to continue Metoprolol. Patient agree to plan.

## 2020-10-06 NOTE — Addendum Note (Signed)
Addended by: Aris Georgia, Jordynne Mccown L on: 10/06/2020 12:55 PM   Modules accepted: Orders

## 2020-10-06 NOTE — Progress Notes (Deleted)
CARDIOLOGY OFFICE NOTE  Date:  10/06/2020    Marissa Jacobs Date of Birth: 1935-09-16 Medical Record #[id number]  PCP:  Marissa Baton, MD  Cardiologist:  Marissa Jacobs chief complaint on file.   History of Present Illness: Marissa Jacobs is a 85 y.o. female who presents today for a work in visit. Seen for [facility].   She has a history of HTN, lung cancer s/p left upper lobe posterior segmentectomy in January 2022 at [facility].  She has had a history of atypical chest pain. Prior coronary CTA with mild non obstructive disease and a 1.8 cm focal ground-glass opacity in superior segment of right lower lobe.   Repeat scans showed 2.3 cm LUL mixed solid and groundglass nodules s/p EBUS showing adenocarcinoma of the nodules.  Treated at [facility] and underwent robotic assisted left upper lobe posterior segmentectomy and mediastinal lymph node dissection.  On postop day 2 patient developing atrial fibrillation and placed on amiodarone drip.  Successfully converted to sinus rhythm. Started on metoprolol and stopped amiodarone.  Just seen 3 weeks ago by Marissa Jacobs - had been to the ER the day prior due to hypertensive urgency. Lots of questions about prior timing of repeating her CT based on the nodule noted off CTA. Had multiple questions.   Comes in today. Here with   Past Medical History:  Diagnosis Date  . Anemia    secondary to taking omeprazole-on iron  . Diverticulosis 2/04  . GERD (gastroesophageal reflux disease)   . Hyperlipidemia   . Hypertension   . Lung nodule   . Osteopenia   . Post traumatic stress disorder   . STD (sexually transmitted disease)    HSV    Past Surgical History:  Procedure Laterality Date  . APPENDECTOMY    . CERVICAL BIOPSY  W/ LOOP ELECTRODE EXCISION     CIN1  . COLPOSCOPY    . ESOPHAGEAL DILATION  2013  . FACIAL COSMETIC SURGERY  9/03  . KNEE SURGERY    . TONSILLECTOMY       Medications: No outpatient medications have been marked as taking for the  10/07/20 encounter (Appointment) with Marissa Junes, NP.     Allergies: Allergies  Allergen Reactions  . Abobotulinumtoxina Rash  . Codeine Hives  . Amoxicillin Itching and Rash    Social History: The patient  reports that she quit smoking about 57 years ago. She has never used smokeless tobacco. She reports current alcohol use of about 5.0 standard drinks of alcohol per week. She reports that she does not use drugs.   Family History: The patient's ***family history includes COPD in her mother; Healthy in her brother; Sudden death (age of onset: 69) in her father.   Review of Systems: Please see the history of present illness.   All other systems are reviewed and negative.   Physical Exam: VS:  LMP 08/16/1998  .  BMI There is no height or weight on file to calculate BMI.  Wt Readings from Last 3 Encounters:  09/11/20 142 lb 3.2 oz (64.5 kg)  05/29/20 140 lb (63.5 kg)  05/05/20 140 lb 9.6 oz (63.8 kg)    General: Pleasant. Well developed, well nourished and in no acute distress.   HEENT: Normal.  Neck: Supple, no JVD, carotid bruits, or masses noted.  Cardiac: ***Regular rate and rhythm. No murmurs, rubs, or gallops. No edema.  Respiratory:  Lungs are clear to auscultation bilaterally with normal work of breathing.  GI:  Soft and nontender.  MS: No deformity or atrophy. Gait and ROM intact.  Skin: Warm and dry. Color is normal.  Neuro:  Strength and sensation are intact and no gross focal deficits noted.  Psych: Alert, appropriate and with normal affect.   LABORATORY DATA:  EKG:  EKG {ACTION; IS/IS UEA:[id number]} ordered today.  Personally reviewed by me. This demonstrates ***.  Lab Results  Component Value Date   WBC 8.3 09/10/2020   HGB 11.9 (L) 09/10/2020   HCT 35.4 (L) 09/10/2020   PLT 348 09/10/2020   GLUCOSE 108 (H) 09/10/2020   NA 135 09/10/2020   K 3.6 09/10/2020   CL 97 (L) 09/10/2020   CREATININE 0.59 09/10/2020   BUN 11 09/10/2020   CO2 27  09/10/2020     BNP (last 3 results) No results for input(s): BNP in the last 8760 hours.  ProBNP (last 3 results) No results for input(s): PROBNP in the last 8760 hours.   Other Studies Reviewed Today:  Coronary CTA 09/13/2019 IMPRESSION: 1. Coronary calcium score of 97.2. This was 43rd percentile for age and sex matched control.   2. Normal coronary origin with right dominance.   3. Mild, calcified, non obstructive disease in the proximal to mid LAD.   4. CAD-RADS 2. Mild non-obstructive CAD (25-49%). Consider non-atherosclerotic causes of chest pain. Consider preventive therapy and risk factor modification.     Assessment/Plan:   ASSESSMENT AND PLAN:      1. HTN Blood pressure stable during office visit. Patient has prenotions about medications. Recommended to continue metoprolol and restart losartan. Hold hydrochlorothiazide and she will restart only if systolic blood pressure above 160. Keep track of blood pressure. Get new blood pressure cuff. Send [place] readings for review.   2. Non obstructive CTA by coronary CTA -No angina.   3. Lung cancer s/p  left upper lobe posterior segmentectomy 08/2020 -Per surgeon.   4. Postop atrial fibrillation -Sinus rhythm by exam and by EKG yesterday. Continue metoprolol. If recurrent episode of A. fib, patient will need anticoagulation.   Son and patient both were in agreement about medications as well as nodules finding on coronary CT. Explained results in detail. Discussed importance of beta-blocker to keep rhythm/rate stable. Patient would like to simplify her regimen. In future consider uptitrating one medication before adding another agent. Suspect underlying stress/anxiety. I have spent at least 45 minutes discussing above + 15 minutes review of chart and recent admission.     Current medicines are reviewed with the patient today.  The patient does not have concerns regarding medicines other than what has been noted  above.  The following changes have been made:  See above.  Labs/ tests ordered today include:   No orders of the defined types were placed in this encounter.    Disposition:   FU with *** in {gen number [id number]:[id number]} {Days to years:10300}.   Patient is agreeable to this plan and will call if any problems develop in the interim.   SignedTruitt Merle, NP  10/06/2020 12:14 PM  [place] [address] [place], [place]  [address] Phone: [phone] Fax: [phone]

## 2020-10-06 NOTE — Telephone Encounter (Signed)
Truitt Merle NP reviewed patient's chart and stated patient did not need to be seen tomorrow. Patient was told at her office visit on 1/27 to continue metoprolol. Informed patient that she needs to continue metoprolol. Patient stated she does not want to keep taking it, that she is already taking enough medications. Informed patient that this is what the provider instructed at her last visit. Informed patient that we could send in a refill, and at her office visit with Dr. Tamala Julian she can discuss changing it at that time. Patient verbalized understanding.

## 2020-10-06 NOTE — Telephone Encounter (Signed)
New message:     Patient calling to ask about some medications.

## 2020-10-07 ENCOUNTER — Ambulatory Visit: Payer: Medicare HMO | Admitting: Nurse Practitioner

## 2020-10-07 ENCOUNTER — Other Ambulatory Visit: Payer: Self-pay | Admitting: Obstetrics & Gynecology

## 2020-10-07 DIAGNOSIS — I10 Essential (primary) hypertension: Secondary | ICD-10-CM | POA: Diagnosis not present

## 2020-10-07 DIAGNOSIS — Z483 Aftercare following surgery for neoplasm: Secondary | ICD-10-CM | POA: Diagnosis not present

## 2020-10-07 DIAGNOSIS — C3412 Malignant neoplasm of upper lobe, left bronchus or lung: Secondary | ICD-10-CM | POA: Diagnosis not present

## 2020-10-07 DIAGNOSIS — K219 Gastro-esophageal reflux disease without esophagitis: Secondary | ICD-10-CM | POA: Diagnosis not present

## 2020-10-09 ENCOUNTER — Other Ambulatory Visit: Payer: Self-pay | Admitting: Obstetrics & Gynecology

## 2020-10-10 DIAGNOSIS — Z483 Aftercare following surgery for neoplasm: Secondary | ICD-10-CM | POA: Diagnosis not present

## 2020-10-10 DIAGNOSIS — C3412 Malignant neoplasm of upper lobe, left bronchus or lung: Secondary | ICD-10-CM | POA: Diagnosis not present

## 2020-10-10 DIAGNOSIS — K219 Gastro-esophageal reflux disease without esophagitis: Secondary | ICD-10-CM | POA: Diagnosis not present

## 2020-10-10 DIAGNOSIS — I10 Essential (primary) hypertension: Secondary | ICD-10-CM | POA: Diagnosis not present

## 2020-10-13 DIAGNOSIS — I10 Essential (primary) hypertension: Secondary | ICD-10-CM | POA: Diagnosis not present

## 2020-10-13 DIAGNOSIS — K219 Gastro-esophageal reflux disease without esophagitis: Secondary | ICD-10-CM | POA: Diagnosis not present

## 2020-10-13 DIAGNOSIS — C3412 Malignant neoplasm of upper lobe, left bronchus or lung: Secondary | ICD-10-CM | POA: Diagnosis not present

## 2020-10-13 DIAGNOSIS — Z483 Aftercare following surgery for neoplasm: Secondary | ICD-10-CM | POA: Diagnosis not present

## 2020-10-20 DIAGNOSIS — Z483 Aftercare following surgery for neoplasm: Secondary | ICD-10-CM | POA: Diagnosis not present

## 2020-10-20 DIAGNOSIS — I10 Essential (primary) hypertension: Secondary | ICD-10-CM | POA: Diagnosis not present

## 2020-10-20 DIAGNOSIS — K219 Gastro-esophageal reflux disease without esophagitis: Secondary | ICD-10-CM | POA: Diagnosis not present

## 2020-10-20 DIAGNOSIS — C3412 Malignant neoplasm of upper lobe, left bronchus or lung: Secondary | ICD-10-CM | POA: Diagnosis not present

## 2020-10-22 DIAGNOSIS — I788 Other diseases of capillaries: Secondary | ICD-10-CM | POA: Diagnosis not present

## 2020-10-22 DIAGNOSIS — D1801 Hemangioma of skin and subcutaneous tissue: Secondary | ICD-10-CM | POA: Diagnosis not present

## 2020-10-22 DIAGNOSIS — L814 Other melanin hyperpigmentation: Secondary | ICD-10-CM | POA: Diagnosis not present

## 2020-10-22 DIAGNOSIS — L905 Scar conditions and fibrosis of skin: Secondary | ICD-10-CM | POA: Diagnosis not present

## 2020-10-22 DIAGNOSIS — L570 Actinic keratosis: Secondary | ICD-10-CM | POA: Diagnosis not present

## 2020-10-22 DIAGNOSIS — L821 Other seborrheic keratosis: Secondary | ICD-10-CM | POA: Diagnosis not present

## 2020-10-25 NOTE — Progress Notes (Signed)
Cardiology Office Note:    Date:  10/27/2020   ID:  Marissa Jacobs, DOB May 18, 1936, MRN [id number]  PCP:  Shon Baton, MD  Cardiologist:  Sinclair Grooms, MD   Referring MD: Shon Baton, MD   Chief Complaint  Patient presents with  . Hypertension  . Coronary Artery Disease    History of Present Illness:    Marissa Jacobs is a 85 y.o. female with a hx of HTN, lung cancer s/p left upper lobe posterior segmentectomy (January 2022 @ [facility]), CAD (non-obstructive by CTA), post operative atrial fibrillation, hyperlipidemia,   Had ER visit for elevated BP 08/2020. Back for f/u after appointment with Robbie Lis, PAC.  As best she can tell, there have been no recurrences of atrial fibrillation.  She wants to be off metoprolol.  Nonobstructive coronary disease by CTA 1 year ago.  Needs aggressive risk factor modification.  She is "anti-- statin".  She is on red yeast rice.  LDL is suboptimal for her known degree of atherosclerosis by coronary CT.  Most recent LDL was 113 and needs to be less than 70.  I encouraged aerobic activity.  She needs better blood pressure control and at her age the blood pressure target is 140/80 mmHg.    Past Medical History:  Diagnosis Date  . Anemia    secondary to taking omeprazole-on iron  . Diverticulosis 2/04  . GERD (gastroesophageal reflux disease)   . Hyperlipidemia   . Hypertension   . Lung nodule   . Osteopenia   . Post traumatic stress disorder   . STD (sexually transmitted disease)    HSV    Past Surgical History:  Procedure Laterality Date  . APPENDECTOMY    . CERVICAL BIOPSY  W/ LOOP ELECTRODE EXCISION     CIN1  . COLPOSCOPY    . ESOPHAGEAL DILATION  2013  . FACIAL COSMETIC SURGERY  9/03  . KNEE SURGERY    . TONSILLECTOMY      Current Medications: Current Meds  Medication Sig  . acetaminophen (TYLENOL) 500 MG tablet Take by mouth.  . Ascorbic Acid (VITAMIN C) 1000 MG tablet 1 tablet  . bifidobacterium infantis (ALIGN)  capsule Take by mouth.  Marland Kitchen CALCIUM PO Take 600 mg by mouth 2 (two) times daily.   . Cholecalciferol (VITAMIN D PO) Take 2,500 mg by mouth every other day.   . Cholecalciferol 125 MCG (5000 UT) TABS Take by mouth.  . Coenzyme Q10 (CO Q 10 PO) Take by mouth daily.  Marland Kitchen EPIPEN 2-PAK 0.3 MG/0.3ML SOAJ injection   . estradiol (VIVELLE-DOT) 0.025 MG/24HR Place 1 patch onto the skin 2 (two) times a week.  . fexofenadine (ALLEGRA) 180 MG tablet Take 180 mg by mouth once a week.  . folic acid (FOLVITE) 387 MCG tablet Take 400 mcg by mouth daily.  . Grape Seed Extract 100 MG CAPS 1 capsule  . hydrochlorothiazide (MICROZIDE) 12.5 MG capsule Take 12.5 mg by mouth daily.  Marland Kitchen losartan (COZAAR) 50 MG tablet Take 1 tablet (50 mg total) by mouth daily.  . Magnesium 300 MG CAPS Take 300 mg by mouth daily.  . Omega-3 Fatty Acids (FISH OIL PO) Take 600 mg by mouth daily.   Marland Kitchen omeprazole (PRILOSEC) 10 MG capsule Take 10 mg by mouth daily.  Marland Kitchen omeprazole (PRILOSEC) 10 MG capsule Take 10 mg by mouth 2 (two) times daily.  Marland Kitchen PROMETRIUM 100 MG capsule TAKE 1 CAPSULE BY MOUTH EVERY DAY  . pyridOXINE (VITAMIN B-6)  100 MG tablet 1 tablet  . Quercetin 50 MG TABS Take by mouth.  . tretinoin (RETIN-A) 0.025 % cream Apply 1 application topically at bedtime.   Marland Kitchen zinc gluconate 50 MG tablet Take by mouth.  . [DISCONTINUED] losartan (COZAAR) 25 MG tablet Take 25 mg by mouth daily.  . [DISCONTINUED] metoprolol tartrate (LOPRESSOR) 25 MG tablet Take 0.5 tablets (12.5 mg total) by mouth 2 (two) times daily.     Allergies:   Abobotulinumtoxina, Codeine, and Amoxicillin   Social History   Socioeconomic History  . Marital status: Divorced    Spouse name: Not on file  . Number of children: Not on file  . Years of education: Not on file  . Highest education level: Not on file  Occupational History  . Not on file  Tobacco Use  . Smoking status: Former Smoker    Quit date: 02/21/1963    Years since quitting: 57.7  . Smokeless  tobacco: Never Used  . Tobacco comment: in college  Vaping Use  . Vaping Use: Never used  Substance and Sexual Activity  . Alcohol use: Yes    Alcohol/week: 5.0 standard drinks    Types: 5 Standard drinks or equivalent per week  . Drug use: No  . Sexual activity: Yes    Partners: Male    Birth control/protection: Post-menopausal  Other Topics Concern  . Not on file  Social History Narrative  . Not on file   Social Determinants of Health   Financial Resource Strain: Not on file  Food Insecurity: Not on file  Transportation Needs: Not on file  Physical Activity: Not on file  Stress: Not on file  Social Connections: Not on file     Family History: The patient's family history includes COPD in her mother; Healthy in her brother; Sudden death (age of onset: 45) in her father.  ROS:   Please see the history of present illness.    Worried about being on statins.  Worried about whether she will have a heart attack or stroke.  Doing well after left upper segmentectomy.  Thankfully she had a coronary CTA which identified the left lung nodule.  All other systems reviewed and are negative.  EKGs/Labs/Other Studies Reviewed:    The following studies were reviewed today:  Coronary CTA 09/13/2019 IMPRESSION: 1. Coronary calcium score of 97.2. This was 43rd percentile for age and sex matched control.  2. Normal coronary origin with right dominance.  3. Mild, calcified, non obstructive disease in the proximal to mid LAD.  4. CAD-RADS 2. Mild non-obstructive CAD (25-49%). Consider non-atherosclerotic causes of chest pain. Consider preventive therapy and risk factor modification.   EKG:  EKG is not repeated  Recent Labs: 09/10/2020: BUN 11; Creatinine, Ser 0.59; Hemoglobin 11.9; Platelets 348; Potassium 3.6; Sodium 135  Recent Lipid Panel No results found for: CHOL, TRIG, HDL, CHOLHDL, VLDL, LDLCALC, LDLDIRECT  Physical Exam:    VS:  BP (!) 156/70   Pulse 70   Ht 5\' 1"   (1.549 m)   Wt 141 lb (64 kg)   LMP 08/16/1998   SpO2 97%   BMI 26.64 kg/m     Wt Readings from Last 3 Encounters:  10/27/20 141 lb (64 kg)  09/11/20 142 lb 3.2 oz (64.5 kg)  05/29/20 140 lb (63.5 kg)     GEN: Healthy-appearing. No acute distress HEENT: Normal NECK: No JVD. LYMPHATICS: No lymphadenopathy CARDIAC: No murmur. RRR no gallop, or edema. VASCULAR:  Normal Pulses. No bruits. RESPIRATORY:  Clear to auscultation without rales, wheezing or rhonchi  ABDOMEN: Soft, non-tender, non-distended, No pulsatile mass, MUSCULOSKELETAL: No deformity  SKIN: Warm and dry NEUROLOGIC:  Alert and oriented x 3 PSYCHIATRIC:  Normal affect   ASSESSMENT:    1. CAD in native artery   2. Essential hypertension   3. PAF (paroxysmal atrial fibrillation) ([place])   4. Pure hyperglyceridemia    PLAN:    In order of problems listed above:  1. Secondary prevention discussed in detail.  Encouraged tighter control of lipids.  Currently does not want to be on therapy. 2. Generally speaking, multiple blood pressures are reviewed and are running above 174 mmHg systolic.  Discontinue metoprolol by decreasing to 12.5 mg once per day for 4 days then stop.  Increase losartan to 50 mg/day.  Continue HCTZ 12.5 mg daily.  Measure blood pressures and report to [place] over the next 2 weeks. 3. Postoperative atrial fibrillation has not recurred. 4. Needs statin therapy to get LDL less than 70.  Overall education and awareness concerning primary risk prevention was discussed in detail: LDL less than 70, hemoglobin A1c less than 7, blood pressure target less than 130/80 mmHg, >150 minutes of moderate aerobic activity per week, avoidance of smoking, weight control (via diet and exercise), and continued surveillance/management of/for obstructive sleep apnea.  She has been drinking 4 to 6 ounces of wine each night.  Red wine is being used to help "lower her cholesterol ".  We discussed cutting back on alcohol intake and  2.5 g sodium diet.  Extended office visit with greater than 50% of the time spent in counseling.  Office visit lasted greater than 40 minutes.  Medication Adjustments/Labs and Tests Ordered: Current medicines are reviewed at length with the patient today.  Concerns regarding medicines are outlined above.  No orders of the defined types were placed in this encounter.  Meds ordered this encounter  Medications  . losartan (COZAAR) 50 MG tablet    Sig: Take 1 tablet (50 mg total) by mouth daily.    Dispense:  90 tablet    Refill:  3    Dose change  D/c Metoprolol    Patient Instructions  Medication Instructions:  1) DECREASE Metoprolol to a half tablet once daily for 3 days and then discontinue. 2) INCREASE Losartan to 50mg  once daily.  Continue to monitor your blood pressure and if your top number is consistently over 145, let us know.  *If you need a refill on your cardiac medications before your next appointment, please call your pharmacy*   Lab Work: None If you have labs (blood work) drawn today and your tests are completely normal, you will receive your results only by: Marland Kitchen MyChart Message (if you have MyChart) OR . A paper copy in the mail If you have any lab test that is abnormal or we need to change your treatment, we will call you to review the results.   Testing/Procedures: None   Follow-Up: At [facility], you and your health needs are our priority.  As part of our continuing mission to provide you with exceptional heart care, we have created designated Provider Care Teams.  These Care Teams include your primary Cardiologist (physician) and Advanced Practice Providers (APPs -  Physician Assistants and Nurse Practitioners) who all work together to provide you with the care you need, when you need it.  We recommend signing up for the patient portal called "MyChart".  Sign up information is provided on this After Visit Summary.  MyChart is used to connect with patients  for Virtual Visits (Telemedicine).  Patients are able to view lab/test results, encounter notes, upcoming appointments, etc.  Non-urgent messages can be sent to your provider as well.   To learn more about what you can do with MyChart, go to [email or web address].    Your next appointment:   1 year(s)  The format for your next appointment:   In Person  Provider:   You may see Sinclair Grooms, MD or one of the following Advanced Practice Providers on your designated Care Team:    Kathyrn Drown, NP    Other Instructions      Signed, Sinclair Grooms, MD  10/27/2020 2:46 PM    [place]

## 2020-10-27 ENCOUNTER — Ambulatory Visit (INDEPENDENT_AMBULATORY_CARE_PROVIDER_SITE_OTHER): Payer: Medicare HMO | Admitting: Interventional Cardiology

## 2020-10-27 ENCOUNTER — Encounter: Payer: Self-pay | Admitting: Interventional Cardiology

## 2020-10-27 ENCOUNTER — Other Ambulatory Visit: Payer: Self-pay

## 2020-10-27 VITALS — BP 156/70 | HR 70 | Ht 61.0 in | Wt 141.0 lb

## 2020-10-27 DIAGNOSIS — I480 Paroxysmal atrial fibrillation: Secondary | ICD-10-CM | POA: Diagnosis not present

## 2020-10-27 DIAGNOSIS — E781 Pure hyperglyceridemia: Secondary | ICD-10-CM

## 2020-10-27 DIAGNOSIS — I2510 Atherosclerotic heart disease of native coronary artery without angina pectoris: Secondary | ICD-10-CM | POA: Diagnosis not present

## 2020-10-27 DIAGNOSIS — I10 Essential (primary) hypertension: Secondary | ICD-10-CM

## 2020-10-27 MED ORDER — LOSARTAN POTASSIUM 50 MG PO TABS: 50.0000 mg | ORAL_TABLET | Freq: Every day | ORAL | 3 refills | Status: DC

## 2020-10-27 NOTE — Patient Instructions (Signed)
Medication Instructions:  1) DECREASE Metoprolol to a half tablet once daily for 3 days and then discontinue. 2) INCREASE Losartan to 50mg  once daily.  Continue to monitor your blood pressure and if your top number is consistently over 145, let us know.  *If you need a refill on your cardiac medications before your next appointment, please call your pharmacy*   Lab Work: None If you have labs (blood work) drawn today and your tests are completely normal, you will receive your results only by: Marland Kitchen MyChart Message (if you have MyChart) OR . A paper copy in the mail If you have any lab test that is abnormal or we need to change your treatment, we will call you to review the results.   Testing/Procedures: None   Follow-Up: At [facility], you and your health needs are our priority.  As part of our continuing mission to provide you with exceptional heart care, we have created designated Provider Care Teams.  These Care Teams include your primary Cardiologist (physician) and Advanced Practice Providers (APPs -  Physician Assistants and Nurse Practitioners) who all work together to provide you with the care you need, when you need it.  We recommend signing up for the patient portal called "MyChart".  Sign up information is provided on this After Visit Summary.  MyChart is used to connect with patients for Virtual Visits (Telemedicine).  Patients are able to view lab/test results, encounter notes, upcoming appointments, etc.  Non-urgent messages can be sent to your provider as well.   To learn more about what you can do with MyChart, go to [email or web address].    Your next appointment:   1 year(s)  The format for your next appointment:   In Person  Provider:   You may see Sinclair Grooms, MD or one of the following Advanced Practice Providers on your designated Care Team:    Kathyrn Drown, NP    Other Instructions

## 2020-10-28 DIAGNOSIS — C3412 Malignant neoplasm of upper lobe, left bronchus or lung: Secondary | ICD-10-CM | POA: Diagnosis not present

## 2020-10-28 DIAGNOSIS — I10 Essential (primary) hypertension: Secondary | ICD-10-CM | POA: Diagnosis not present

## 2020-10-28 DIAGNOSIS — Z483 Aftercare following surgery for neoplasm: Secondary | ICD-10-CM | POA: Diagnosis not present

## 2020-10-28 DIAGNOSIS — K219 Gastro-esophageal reflux disease without esophagitis: Secondary | ICD-10-CM | POA: Diagnosis not present

## 2020-11-17 ENCOUNTER — Other Ambulatory Visit: Payer: Self-pay

## 2020-11-17 ENCOUNTER — Ambulatory Visit: Payer: Medicare HMO | Attending: Internal Medicine | Admitting: Physical Therapy

## 2020-11-17 DIAGNOSIS — R2689 Other abnormalities of gait and mobility: Secondary | ICD-10-CM | POA: Diagnosis not present

## 2020-11-17 DIAGNOSIS — R293 Abnormal posture: Secondary | ICD-10-CM | POA: Insufficient documentation

## 2020-11-17 NOTE — Patient Instructions (Signed)
Access Code: [email or web address] URL: [email or web address] Date: 11/17/2020 Prepared by: Everardo All  Exercises Seated Neck Sidebending Stretch with Pillow - 2 x daily - 7 x weekly - 1 sets - 2 reps - 20s hold Gentle Levator Scapulae Stretch - 2 x daily - 7 x weekly - 1 sets - 2 reps - 20s hold Standing Row with Anchored Resistance - 1 x daily - 7 x weekly - 3 sets - 10 reps Shoulder External Rotation with Anchored Resistance - 1 x daily - 7 x weekly - 3 sets - 10 reps

## 2020-11-17 NOTE — Therapy (Signed)
[facility] [address], [place], [place], [address] Phone: [phone]   Fax:  [phone]  Physical Therapy Evaluation  Patient Details  Name: Marissa Jacobs MRN: [id number] Date of Birth: [birth year] Referring Provider (PT): Shon Baton, MD   Encounter Date: 11/17/2020   PT End of Session - 11/17/20 1714    Visit Number 1    Number of Visits 4    Date for PT Re-Evaluation 01/12/21    Authorization Type Medicare    Progress Note Due on Visit 10    PT Start Time 1450    PT Stop Time 1530    PT Time Calculation (min) 40 min           Past Medical History:  Diagnosis Date  . Anemia    secondary to taking omeprazole-on iron  . Diverticulosis 2/04  . GERD (gastroesophageal reflux disease)   . Hyperlipidemia   . Hypertension   . Lung nodule   . Osteopenia   . Post traumatic stress disorder   . STD (sexually transmitted disease)    HSV    Past Surgical History:  Procedure Laterality Date  . APPENDECTOMY    . CERVICAL BIOPSY  W/ LOOP ELECTRODE EXCISION     CIN1  . COLPOSCOPY    . ESOPHAGEAL DILATION  2013  . FACIAL COSMETIC SURGERY  9/03  . KNEE SURGERY    . TONSILLECTOMY      There were no vitals filed for this visit.    Subjective Assessment - 11/17/20 1709    Subjective Patient presenting for abnormal gait and mobility per referral. Per patient, she would like to work on her posture to prevent having issues with breathing. States that she does not currently have breathing impairments but feels that posture could affect this in the future. She reports undergoing surgery 09/04/2020 to have a nodule removed from her Lt lung. She remained in hospital on step down floor x5 days. Patient then had 4-6 weeks of HHPT upon discharge. She feels that addressing balance could be beneficial as well.    Pertinent History lung nodule, HTN, osteopenia, PTSD    How long can you sit comfortably? unlimited    How long  can you stand comfortably? unlimited    How long can you walk comfortably? unlimited    Patient Stated Goals to not have posture affect breathing    Currently in Pain? No/denies    Multiple Pain Sites No              [facility] PT Assessment - 11/17/20 0001      Assessment   Medical Diagnosis R26.9 (ICD-10-CM) - Unspecified abnormalities of gait and mobility    Referring Provider (PT) Shon Baton, MD    Onset Date/Surgical Date 09/04/20    Hand Dominance Right    Next MD Visit May      Precautions   Precaution Comments No core exercises until MD follow-up in May      Restrictions   Weight Bearing Restrictions Yes   No lifting >10 lbs     Balance Screen   Has the patient fallen in the past 6 months No    Has the patient had a decrease in activity level because of a fear of falling?  No    Is the patient reluctant to leave their home because of a fear of falling?  No      Home Ecologist residence  Living Arrangements Spouse/significant other    Type of [place] Two level      Prior Function   Level of Independence Independent    Vocation Full time employment      ROM / Strength   AROM / PROM / Strength AROM;Strength      AROM   Overall AROM Comments Bil shoulder AROM WNL    AROM Assessment Site Cervical    Cervical Flexion 30    Cervical Extension 30    Cervical - Right Side Bend 28    Cervical - Left Side Bend 41    Cervical - Right Rotation 61    Cervical - Left Rotation 79      Strength   Overall Strength Comments bil LE strength 5/5    Strength Assessment Site Cervical;Shoulder    Right/Left Shoulder Right;Left    Right Shoulder Flexion 5/5    Right Shoulder ABduction 4/5    Right Shoulder Internal Rotation 5/5    Right Shoulder External Rotation 5/5    Left Shoulder Flexion 5/5    Left Shoulder ABduction 4/5    Left Shoulder Internal Rotation 5/5    Left Shoulder External Rotation 5/5    Cervical Flexion  5/5    Cervical Extension 5/5    Cervical - Right Side Bend 5/5    Cervical - Left Side Bend 5/5    Cervical - Right Rotation 5/5    Cervical - Left Rotation 4/5      Palpation   Palpation comment No apparent tenderness upon palpation to Lt pec, upper trap, levator scap, muscles or rotator cuff      Standardized Balance Assessment   Standardized Balance Assessment Berg Balance Test      Berg Balance Test   Sit to Stand Able to stand without using hands and stabilize independently    Standing Unsupported Able to stand safely 2 minutes    Sitting with Back Unsupported but Feet Supported on Floor or Stool Able to sit safely and securely 2 minutes    Stand to Sit Sits safely with minimal use of hands    Transfers Able to transfer safely, minor use of hands    Standing Unsupported with Eyes Closed Able to stand 10 seconds safely    Standing Unsupported with Feet Together Able to place feet together independently and stand 1 minute safely    From Standing, Reach Forward with Outstretched Arm Can reach forward >12 cm safely (5")    From Standing Position, Pick up Object from Floor Able to pick up shoe safely and easily    From Standing Position, Turn to Look Behind Over each Shoulder Looks behind from both sides and weight shifts well    Turn 360 Degrees Able to turn 360 degrees safely in 4 seconds or less    Standing Unsupported, Alternately Place Feet on Step/Stool Able to complete 4 steps without aid or supervision    Standing Unsupported, One Foot in Front Able to plae foot ahead of the other independently and hold 30 seconds    Standing on One Leg Able to lift leg independently and hold 5-10 seconds    Total Score 51                      Objective measurements completed on examination: See above findings.               PT Education - 11/17/20 1713  Education Details Access Code: [email or web address]    Person(s) Educated Patient    Methods  Explanation;Demonstration;Tactile cues;Verbal cues    Comprehension Verbalized understanding;Returned demonstration;Verbal cues required;Tactile cues required            PT Short Term Goals - 11/17/20 1723      PT SHORT TERM GOAL #1   Title Patient will be independent with HEP for continued progression at home.    Time 4    Period Weeks    Status New    Target Date 12/15/20      PT SHORT TERM GOAL #2   Title Patient will demonstrate isolated cervical lateral flexion of 20 degrees bilaterally to indicate improved mobility for ADLs    Time 4    Period Weeks    Status New    Target Date 12/15/20             PT Long Term Goals - 11/17/20 1734      PT LONG TERM GOAL #1   Title Patient will be independent with advanced HEP for long term management of symptoms post D/C.    Time 8    Period Weeks    Status New    Target Date 01/12/21      PT LONG TERM GOAL #2   Title Patient will demonstrate isolated cervical lateral flexion 30 degrees bilaterally for improved mobility for ADLs    Time 8    Period Weeks    Status New    Target Date 01/12/21      PT LONG TERM GOAL #3   Title FOTO goal    Time 8    Period Weeks    Status New    Target Date 01/12/21                  Plan - 11/17/20 1714    Clinical Impression Statement Patient is an 85 y/o female referred due to gait and mobility impairments. PMH includes history of lung nodule with surgical removal 09/04/2020, HTN, osteopenia, and PTSD. Patient currently under lifting precautions as she is not to lift >10 lbs. Also, patient is to avoid core strengthening until MD follow up in May. Patient demonstrats UE strength impairments as bilateral shoulder abduction 4/5. Bil LE strength grossly 5/5. Noting cervical strength impairments as well as Lt cervical rotation 4/5. Patient exhibits numerous postural deviations as she rests in increased thoracic kyphosis with bilateral rounded shoulders and significant forward head.  Cervical AROM deficits apparent as patient unable to perform isolated lateral flexion bilaterally without rotation or increased shoulder hike. Patient scoring 51/56 on BERG balance scale indicating low fall risk. Requiring verbal and tactile cues for decreased scapular elevation with retraction. Would benefit from skilled intervention to address impairments for improved postural balance and strength.    Personal Factors and Comorbidities Comorbidity 3+    Comorbidities lung nodule, HTN, osteopenia, PTSD    Examination-Activity Limitations Lift    Examination-Participation Restrictions Cleaning;Laundry;Yard Work    Merchant [employer] officer Stable/Uncomplicated    Designer, jewellery Low    Rehab Potential Excellent    PT Frequency Biweekly    PT Duration 8 weeks    PT Next Visit Plan review HEP; continue postural strengthening and mobility to patient tolerance; establish FOTO goal    PT Home Exercise Plan Access Code: [email or web address]    Consulted and Agree with Plan of Care Patient           Patient will benefit from skilled therapeutic  intervention in order to improve the following deficits and impairments:  Decreased range of motion,Decreased strength,Hypomobility,Increased muscle spasms,Impaired UE functional use,Postural dysfunction  Visit Diagnosis: Other abnormalities of gait and mobility - Plan: PT plan of care cert/re-cert  Abnormal posture - Plan: PT plan of care cert/re-cert     Problem List Patient Active Problem List   Diagnosis Date Noted  . Pulmonary nodules/lesions, multiple 05/05/2020  . Bilateral hearing loss 05/29/2019  . Osteoarthritis of knee 06/27/2018  . Atypical chest pain 05/02/2018  . Hypertensive disorder 05/02/2018  . GERD with stricture 11/01/2014  . Hyperlipidemia 07/28/2012   Everardo All PT, DPT  11/17/20 5:40 PM   [facility] [address], [place], [place],  [address] Phone: [phone]   Fax:  [phone]  Name: TALLIA MOEHRING MRN: [id number] Date of Birth: 14-Sep-1935

## 2020-12-09 DIAGNOSIS — M40209 Unspecified kyphosis, site unspecified: Secondary | ICD-10-CM | POA: Diagnosis not present

## 2020-12-09 DIAGNOSIS — I2584 Coronary atherosclerosis due to calcified coronary lesion: Secondary | ICD-10-CM | POA: Diagnosis not present

## 2020-12-09 DIAGNOSIS — M8580 Other specified disorders of bone density and structure, unspecified site: Secondary | ICD-10-CM | POA: Diagnosis not present

## 2020-12-09 DIAGNOSIS — J439 Emphysema, unspecified: Secondary | ICD-10-CM | POA: Diagnosis not present

## 2020-12-09 DIAGNOSIS — E785 Hyperlipidemia, unspecified: Secondary | ICD-10-CM | POA: Diagnosis not present

## 2020-12-09 DIAGNOSIS — R69 Illness, unspecified: Secondary | ICD-10-CM | POA: Diagnosis not present

## 2020-12-09 DIAGNOSIS — I10 Essential (primary) hypertension: Secondary | ICD-10-CM | POA: Diagnosis not present

## 2020-12-09 DIAGNOSIS — I872 Venous insufficiency (chronic) (peripheral): Secondary | ICD-10-CM | POA: Diagnosis not present

## 2020-12-09 DIAGNOSIS — I700 Atherosclerosis of aorta: Secondary | ICD-10-CM | POA: Diagnosis not present

## 2020-12-21 NOTE — Progress Notes (Signed)
Cardiology Office Note:    Date:  12/22/2020   ID:  Marissa Jacobs, DOB 06-29-1936, MRN [id number]  PCP:  Shon Baton, MD  Cardiologist:  Sinclair Grooms, MD   Referring MD: Shon Baton, MD   Chief Complaint  Patient presents with  . Follow-up    Postoperative atrial fibrillation  . Hypertension    History of Present Illness:    Marissa Jacobs is a 85 y.o. female with a hx of  primary HTN,lung cancer s/p left upper lobe posterior segmentectomy (January 2022 @ [facility]), CAD (non-obstructive by CTA), post operative atrial fibrillation, hyperlipidemia, and primary hypertension.  Overall, she is doing okay.  Surveillance is being performed on the lung cancer resection that was performed in January.  She does a lot of reading and is concerned about the small possibility of lung cancer in the setting of angiotensin receptor blocker use.  She has sent several MyChart messages, Internet articles, and is spoken to her other physicians.  We had a conversation concerning these.  Problem is she has read about other antihypertensive agents and for various reasons did not want to be on any of them (amlodipine-fear of swelling; beta-blockers- fatigue, etc.; ACE inhibitor therapy- chronic cough.  Etc.)  Decided that she will stay on ARB therapy for at least another year.  Concerned about recent blood pressure recordings.  She has been routinely recording the blood pressure prior to taking her antihypertensive therapy each morning.  No palpitations have occurred that would suggest recurrent atrial fibrillation which is the real reason that I started seeing her following surgery.  Past Medical History:  Diagnosis Date  . Anemia    secondary to taking omeprazole-on iron  . Diverticulosis 2/04  . GERD (gastroesophageal reflux disease)   . Hyperlipidemia   . Hypertension   . Lung nodule   . Osteopenia   . Post traumatic stress disorder   . STD (sexually transmitted disease)    HSV    Past  Surgical History:  Procedure Laterality Date  . APPENDECTOMY    . CERVICAL BIOPSY  W/ LOOP ELECTRODE EXCISION     CIN1  . COLPOSCOPY    . ESOPHAGEAL DILATION  2013  . FACIAL COSMETIC SURGERY  9/03  . KNEE SURGERY    . TONSILLECTOMY      Current Medications: Current Meds  Medication Sig  . Ascorbic Acid (VITAMIN C) 1000 MG tablet 1 tablet  . CALCIUM PO Take 600 mg by mouth 2 (two) times daily.   . Cholecalciferol (VITAMIN D PO) Take 2,500 mg by mouth every other day.   . Coenzyme Q10 (CO Q 10 PO) Take by mouth daily.  Marland Kitchen EPIPEN 2-PAK 0.3 MG/0.3ML SOAJ injection   . estradiol (VIVELLE-DOT) 0.025 MG/24HR Place 1 patch onto the skin 2 (two) times a week.  . fexofenadine (ALLEGRA) 180 MG tablet Take 180 mg by mouth once a week.  . folic acid (FOLVITE) 539 MCG tablet Take 400 mcg by mouth daily.  . Grape Seed Extract 100 MG CAPS 1 capsule  . hydrochlorothiazide (MICROZIDE) 12.5 MG capsule Take 12.5 mg by mouth daily.  Marland Kitchen losartan (COZAAR) 50 MG tablet Take 1 tablet (50 mg total) by mouth daily.  . Magnesium 300 MG CAPS Take 300 mg by mouth daily.  . Omega-3 Fatty Acids (FISH OIL PO) Take 600 mg by mouth daily.   Marland Kitchen omeprazole (PRILOSEC) 10 MG capsule Take 10 mg by mouth daily.  Marland Kitchen omeprazole (PRILOSEC) 10 MG capsule  Take 10 mg by mouth 2 (two) times daily.  Marland Kitchen PROMETRIUM 100 MG capsule TAKE 1 CAPSULE BY MOUTH EVERY DAY  . pyridOXINE (VITAMIN B-6) 100 MG tablet 1 tablet  . Quercetin 50 MG TABS Take by mouth.  . tretinoin (RETIN-A) 0.025 % cream Apply 1 application topically at bedtime.   Marland Kitchen zinc gluconate 50 MG tablet Take by mouth.     Allergies:   Abobotulinumtoxina, Codeine, and Amoxicillin   Social History   Socioeconomic History  . Marital status: Divorced    Spouse name: Not on file  . Number of children: Not on file  . Years of education: Not on file  . Highest education level: Not on file  Occupational History  . Not on file  Tobacco Use  . Smoking status: Former Smoker     Quit date: 02/21/1963    Years since quitting: 57.8  . Smokeless tobacco: Never Used  . Tobacco comment: in college  Vaping Use  . Vaping Use: Never used  Substance and Sexual Activity  . Alcohol use: Yes    Alcohol/week: 5.0 standard drinks    Types: 5 Standard drinks or equivalent per week  . Drug use: No  . Sexual activity: Yes    Partners: Male    Birth control/protection: Post-menopausal  Other Topics Concern  . Not on file  Social History Narrative  . Not on file   Social Determinants of Health   Financial Resource Strain: Not on file  Food Insecurity: Not on file  Transportation Needs: Not on file  Physical Activity: Not on file  Stress: Not on file  Social Connections: Not on file     Family History: The patient's family history includes COPD in her mother; Healthy in her brother; Sudden death (age of onset: 54) in her father.  ROS:   Please see the history of present illness.    No new complaints.  Anxiety concerning left segmentectomy.  All other systems reviewed and are negative.  EKGs/Labs/Other Studies Reviewed:    The following studies were reviewed today: No new data  EKG:  EKG not repeated  Recent Labs: 09/10/2020: BUN 11; Creatinine, Ser 0.59; Hemoglobin 11.9; Platelets 348; Potassium 3.6; Sodium 135  Recent Lipid Panel No results found for: CHOL, TRIG, HDL, CHOLHDL, VLDL, LDLCALC, LDLDIRECT  Physical Exam:    VS:  BP 124/60   Pulse 83   Ht 5\' 1"  (1.549 m)   Wt 142 lb 12.8 oz (64.8 kg)   LMP 08/16/1998   SpO2 98%   BMI 26.98 kg/m     Wt Readings from Last 3 Encounters:  12/22/20 142 lb 12.8 oz (64.8 kg)  10/27/20 141 lb (64 kg)  09/11/20 142 lb 3.2 oz (64.5 kg)     GEN: Appears younger than stated age. No acute distress HEENT: Normal NECK: No JVD. CARDIAC: No murmur. RRR no gallop, or edema. RESPIRATORY:  Clear to auscultation without rales, wheezing or rhonchi. SKIN: Warm and dry NEUROLOGIC:  Alert and oriented x  3 PSYCHIATRIC:  Normal affect   ASSESSMENT:    1. CAD in native artery   2. PAF (paroxysmal atrial fibrillation) ([place])   3. Essential hypertension   4. Pure hypertriglyceridemia    PLAN:    In order of problems listed above:  1. Primary prevention discussed 2. No clinical recurrence 3. Adequate blood pressure control but needs monitoring.  Recommend the recording blood pressures 2 to 6 hours after her morning medications rather than prior to taking  the medications.  Target for age is 140/80 mmHg.  Continue current therapy at least for an additional year.  Risk of developing lung cancer on ARB therapy is low. 4. Prevention discussed as noted above.  Overall education and awareness concerning primary risk prevention was discussed in detail: LDL less than 70, hemoglobin A1c less than 7, blood pressure target less than 130/80 mmHg, >150 minutes of moderate aerobic activity per week, avoidance of smoking, weight control (via diet and exercise), and continued surveillance/management of/for obstructive sleep apnea.    Medication Adjustments/Labs and Tests Ordered: Current medicines are reviewed at length with the patient today.  Concerns regarding medicines are outlined above.  No orders of the defined types were placed in this encounter.  No orders of the defined types were placed in this encounter.   Patient Instructions   Medication Instructions:  Your physician recommends that you continue on your current medications as directed. Please refer to the Current Medication list given to you today.  *If you need a refill on your cardiac medications before your next appointment, please call your pharmacy*   Lab Work: None If you have labs (blood work) drawn today and your tests are completely normal, you will receive your results only by: Marland Kitchen MyChart Message (if you have MyChart) OR . A paper copy in the mail If you have any lab test that is abnormal or we need to change your  treatment, we will call you to review the results.   Testing/Procedures: None   Follow-Up: At [facility], you and your health needs are our priority.  As part of our continuing mission to provide you with exceptional heart care, we have created designated Provider Care Teams.  These Care Teams include your primary Cardiologist (physician) and Advanced Practice Providers (APPs -  Physician Assistants and Nurse Practitioners) who all work together to provide you with the care you need, when you need it.  We recommend signing up for the patient portal called "MyChart".  Sign up information is provided on this After Visit Summary.  MyChart is used to connect with patients for Virtual Visits (Telemedicine).  Patients are able to view lab/test results, encounter notes, upcoming appointments, etc.  Non-urgent messages can be sent to your provider as well.   To learn more about what you can do with MyChart, go to [email or web address].    Your next appointment:   1 year(s)  The format for your next appointment:   In Person  Provider:   You may see Sinclair Grooms, MD or one of the following Advanced Practice Providers on your designated Care Team:    Kathyrn Drown, NP    Other Instructions   [email or web address]">  DASH Eating Plan DASH stands for Dietary Approaches to Stop Hypertension. The DASH eating plan is a healthy eating plan that has been shown to:  Reduce high blood pressure (hypertension).  Reduce your risk for type 2 diabetes, heart disease, and stroke.  Help with weight loss. What are tips for following this plan? Reading food labels  Check food labels for the amount of salt (sodium) per serving. Choose foods with less than 5 percent of the Daily Value of sodium. Generally, foods with less than 300 milligrams (mg) of sodium per serving fit into this eating plan.  To find whole grains, look for the word "whole" as the  first word in the ingredient list. Shopping  Buy products labeled as "low-sodium" or "no salt added."  Buy fresh foods. Avoid canned  foods and pre-made or frozen meals. Cooking  Avoid adding salt when cooking. Use salt-free seasonings or herbs instead of table salt or sea salt. Check with your health care provider or pharmacist before using salt substitutes.  Do not fry foods. Cook foods using healthy methods such as baking, boiling, grilling, roasting, and broiling instead.  Cook with heart-healthy oils, such as olive, canola, avocado, soybean, or sunflower oil. Meal planning  Eat a balanced diet that includes: ? 4 or more servings of fruits and 4 or more servings of vegetables each day. Try to fill one-half of your plate with fruits and vegetables. ? 6-8 servings of whole grains each day. ? Less than 6 oz (170 g) of lean meat, poultry, or fish each day. A 3-oz (85-g) serving of meat is about the same size as a deck of cards. One egg equals 1 oz (28 g). ? 2-3 servings of low-fat dairy each day. One serving is 1 cup (237 mL). ? 1 serving of nuts, seeds, or beans 5 times each week. ? 2-3 servings of heart-healthy fats. Healthy fats called omega-3 fatty acids are found in foods such as walnuts, flaxseeds, fortified milks, and eggs. These fats are also found in cold-water fish, such as sardines, salmon, and mackerel.  Limit how much you eat of: ? Canned or prepackaged foods. ? Food that is high in trans fat, such as some fried foods. ? Food that is high in saturated fat, such as fatty meat. ? Desserts and other sweets, sugary drinks, and other foods with added sugar. ? Full-fat dairy products.  Do not salt foods before eating.  Do not eat more than 4 egg yolks a week.  Try to eat at least 2 vegetarian meals a week.  Eat more home-cooked food and less restaurant, buffet, and fast food.   Lifestyle  When eating at a restaurant, ask that your food be prepared with less salt or no  salt, if possible.  If you drink alcohol: ? Limit how much you use to:  0-1 drink a day for women who are not pregnant.  0-2 drinks a day for men. ? Be aware of how much alcohol is in your drink. In the [place] one drink equals one 12 oz bottle of beer (355 mL), one 5 oz glass of wine (148 mL), or one 1 oz glass of hard liquor (44 mL). General information  Avoid eating more than 2,300 mg of salt a day. If you have hypertension, you may need to reduce your sodium intake to 1,500 mg a day.  Work with your health care provider to maintain a healthy body weight or to lose weight. Ask what an ideal weight is for you.  Get at least 30 minutes of exercise that causes your heart to beat faster (aerobic exercise) most days of the week. Activities may include walking, swimming, or biking.  Work with your health care provider or dietitian to adjust your eating plan to your individual calorie needs. What foods should I eat? Fruits All fresh, dried, or frozen fruit. Canned fruit in natural juice (without added sugar). Vegetables Fresh or frozen vegetables (raw, steamed, roasted, or grilled). Low-sodium or reduced-sodium tomato and vegetable juice. Low-sodium or reduced-sodium tomato sauce and tomato paste. Low-sodium or reduced-sodium canned vegetables. Grains Whole-grain or whole-wheat bread. Whole-grain or whole-wheat pasta. Brown rice. [place]. Bulgur. Whole-grain and low-sodium cereals. Pita bread. Low-fat, low-sodium crackers. Whole-wheat flour tortillas. Meats and other proteins Skinless chicken or [place]. Ground chicken or [place].  Pork with fat trimmed off. Fish and seafood. Egg whites. Dried beans, peas, or lentils. Unsalted nuts, nut butters, and seeds. Unsalted canned beans. Lean cuts of beef with fat trimmed off. Low-sodium, lean precooked or cured meat, such as sausages or meat loaves. Dairy Low-fat (1%) or fat-free (skim) milk. Reduced-fat, low-fat, or fat-free cheeses. Nonfat,  low-sodium ricotta or cottage cheese. Low-fat or nonfat yogurt. Low-fat, low-sodium cheese. Fats and oils Soft margarine without trans fats. Vegetable oil. Reduced-fat, low-fat, or light mayonnaise and salad dressings (reduced-sodium). Canola, safflower, olive, avocado, soybean, and sunflower oils. Avocado. Seasonings and condiments Herbs. Spices. Seasoning mixes without salt. Other foods Unsalted popcorn and pretzels. Fat-free sweets. The items listed above may not be a complete list of foods and beverages you can eat. Contact a dietitian for more information. What foods should I avoid? Fruits Canned fruit in a light or heavy syrup. Fried fruit. Fruit in cream or butter sauce. Vegetables Creamed or fried vegetables. Vegetables in a cheese sauce. Regular canned vegetables (not low-sodium or reduced-sodium). Regular canned tomato sauce and paste (not low-sodium or reduced-sodium). Regular tomato and vegetable juice (not low-sodium or reduced-sodium). Angie Fava. Olives. Grains Baked goods made with fat, such as croissants, muffins, or some breads. Dry pasta or rice meal packs. Meats and other proteins Fatty cuts of meat. Ribs. Fried meat. Berniece Salines. Bologna, salami, and other precooked or cured meats, such as sausages or meat loaves. Fat from the back of a pig (fatback). Bratwurst. Salted nuts and seeds. Canned beans with added salt. Canned or smoked fish. Whole eggs or egg yolks. Chicken or [place] with skin. Dairy Whole or 2% milk, cream, and half-and-half. Whole or full-fat cream cheese. Whole-fat or sweetened yogurt. Full-fat cheese. Nondairy creamers. Whipped toppings. Processed cheese and cheese spreads. Fats and oils Butter. Stick margarine. Lard. Shortening. Ghee. Bacon fat. Tropical oils, such as coconut, palm kernel, or palm oil. Seasonings and condiments Onion salt, garlic salt, seasoned salt, table salt, and sea salt. Worcestershire sauce. Tartar sauce. Barbecue sauce. Teriyaki sauce. Soy  sauce, including reduced-sodium. Steak sauce. Canned and packaged gravies. Fish sauce. Oyster sauce. Cocktail sauce. Store-bought horseradish. Ketchup. Mustard. Meat flavorings and tenderizers. Bouillon cubes. Hot sauces. Pre-made or packaged marinades. Pre-made or packaged taco seasonings. Relishes. Regular salad dressings. Other foods Salted popcorn and pretzels. The items listed above may not be a complete list of foods and beverages you should avoid. Contact a dietitian for more information. Where to find more information  National Heart, Lung, and Blood Institute: https://wilson-eaton.com/  American Heart Association: www.heart.org  Academy of Nutrition and Dietetics: www.eatright.[place]: www.kidney.org Summary  The DASH eating plan is a healthy eating plan that has been shown to reduce high blood pressure (hypertension). It may also reduce your risk for type 2 diabetes, heart disease, and stroke.  When on the DASH eating plan, aim to eat more fresh fruits and vegetables, whole grains, lean proteins, low-fat dairy, and heart-healthy fats.  With the DASH eating plan, you should limit salt (sodium) intake to 2,300 mg a day. If you have hypertension, you may need to reduce your sodium intake to 1,500 mg a day.  Work with your health care provider or dietitian to adjust your eating plan to your individual calorie needs. This information is not intended to replace advice given to you by your health care provider. Make sure you discuss any questions you have with your health care provider. Document Revised: 07/06/2019 Document Reviewed: 07/06/2019 Elsevier Patient Education  2021 Elsevier  Inc.      Signed, Sinclair Grooms, MD  12/22/2020 12:53 PM    [facility]

## 2020-12-22 ENCOUNTER — Encounter: Payer: Self-pay | Admitting: Interventional Cardiology

## 2020-12-22 ENCOUNTER — Other Ambulatory Visit: Payer: Self-pay

## 2020-12-22 ENCOUNTER — Ambulatory Visit: Payer: Medicare HMO | Admitting: Interventional Cardiology

## 2020-12-22 VITALS — BP 124/60 | HR 83 | Ht 61.0 in | Wt 142.8 lb

## 2020-12-22 DIAGNOSIS — I10 Essential (primary) hypertension: Secondary | ICD-10-CM | POA: Diagnosis not present

## 2020-12-22 DIAGNOSIS — I480 Paroxysmal atrial fibrillation: Secondary | ICD-10-CM

## 2020-12-22 DIAGNOSIS — E781 Pure hyperglyceridemia: Secondary | ICD-10-CM

## 2020-12-22 DIAGNOSIS — I2510 Atherosclerotic heart disease of native coronary artery without angina pectoris: Secondary | ICD-10-CM | POA: Diagnosis not present

## 2020-12-22 NOTE — Patient Instructions (Signed)
Medication Instructions:  Your physician recommends that you continue on your current medications as directed. Please refer to the Current Medication list given to you today.  *If you need a refill on your cardiac medications before your next appointment, please call your pharmacy*   Lab Work: None If you have labs (blood work) drawn today and your tests are completely normal, you will receive your results only by: Marland Kitchen MyChart Message (if you have MyChart) OR . A paper copy in the mail If you have any lab test that is abnormal or we need to change your treatment, we will call you to review the results.   Testing/Procedures: None   Follow-Up: At [facility], you and your health needs are our priority.  As part of our continuing mission to provide you with exceptional heart care, we have created designated Provider Care Teams.  These Care Teams include your primary Cardiologist (physician) and Advanced Practice Providers (APPs -  Physician Assistants and Nurse Practitioners) who all work together to provide you with the care you need, when you need it.  We recommend signing up for the patient portal called "MyChart".  Sign up information is provided on this After Visit Summary.  MyChart is used to connect with patients for Virtual Visits (Telemedicine).  Patients are able to view lab/test results, encounter notes, upcoming appointments, etc.  Non-urgent messages can be sent to your provider as well.   To learn more about what you can do with MyChart, go to [email or web address].    Your next appointment:   1 year(s)  The format for your next appointment:   In Person  Provider:   You may see Sinclair Grooms, MD or one of the following Advanced Practice Providers on your designated Care Team:    Kathyrn Drown, NP    Other Instructions   [email or web address]">  DASH Eating Plan DASH stands for Dietary Approaches to Stop  Hypertension. The DASH eating plan is a healthy eating plan that has been shown to:  Reduce high blood pressure (hypertension).  Reduce your risk for type 2 diabetes, heart disease, and stroke.  Help with weight loss. What are tips for following this plan? Reading food labels  Check food labels for the amount of salt (sodium) per serving. Choose foods with less than 5 percent of the Daily Value of sodium. Generally, foods with less than 300 milligrams (mg) of sodium per serving fit into this eating plan.  To find whole grains, look for the word "whole" as the first word in the ingredient list. Shopping  Buy products labeled as "low-sodium" or "no salt added."  Buy fresh foods. Avoid canned foods and pre-made or frozen meals. Cooking  Avoid adding salt when cooking. Use salt-free seasonings or herbs instead of table salt or sea salt. Check with your health care provider or pharmacist before using salt substitutes.  Do not fry foods. Cook foods using healthy methods such as baking, boiling, grilling, roasting, and broiling instead.  Cook with heart-healthy oils, such as olive, canola, avocado, soybean, or sunflower oil. Meal planning  Eat a balanced diet that includes: ? 4 or more servings of fruits and 4 or more servings of vegetables each day. Try to fill one-half of your plate with fruits and vegetables. ? 6-8 servings of whole grains each day. ? Less than 6 oz (170 g) of lean meat, poultry, or fish each day. A 3-oz (85-g) serving of meat is about the same size as a deck of cards.  One egg equals 1 oz (28 g). ? 2-3 servings of low-fat dairy each day. One serving is 1 cup (237 mL). ? 1 serving of nuts, seeds, or beans 5 times each week. ? 2-3 servings of heart-healthy fats. Healthy fats called omega-3 fatty acids are found in foods such as walnuts, flaxseeds, fortified milks, and eggs. These fats are also found in cold-water fish, such as sardines, salmon, and mackerel.  Limit how  much you eat of: ? Canned or prepackaged foods. ? Food that is high in trans fat, such as some fried foods. ? Food that is high in saturated fat, such as fatty meat. ? Desserts and other sweets, sugary drinks, and other foods with added sugar. ? Full-fat dairy products.  Do not salt foods before eating.  Do not eat more than 4 egg yolks a week.  Try to eat at least 2 vegetarian meals a week.  Eat more home-cooked food and less restaurant, buffet, and fast food.   Lifestyle  When eating at a restaurant, ask that your food be prepared with less salt or no salt, if possible.  If you drink alcohol: ? Limit how much you use to:  0-1 drink a day for women who are not pregnant.  0-2 drinks a day for men. ? Be aware of how much alcohol is in your drink. In the [place] one drink equals one 12 oz bottle of beer (355 mL), one 5 oz glass of wine (148 mL), or one 1 oz glass of hard liquor (44 mL). General information  Avoid eating more than 2,300 mg of salt a day. If you have hypertension, you may need to reduce your sodium intake to 1,500 mg a day.  Work with your health care provider to maintain a healthy body weight or to lose weight. Ask what an ideal weight is for you.  Get at least 30 minutes of exercise that causes your heart to beat faster (aerobic exercise) most days of the week. Activities may include walking, swimming, or biking.  Work with your health care provider or dietitian to adjust your eating plan to your individual calorie needs. What foods should I eat? Fruits All fresh, dried, or frozen fruit. Canned fruit in natural juice (without added sugar). Vegetables Fresh or frozen vegetables (raw, steamed, roasted, or grilled). Low-sodium or reduced-sodium tomato and vegetable juice. Low-sodium or reduced-sodium tomato sauce and tomato paste. Low-sodium or reduced-sodium canned vegetables. Grains Whole-grain or whole-wheat bread. Whole-grain or whole-wheat pasta. Brown rice.  [place]. Bulgur. Whole-grain and low-sodium cereals. Pita bread. Low-fat, low-sodium crackers. Whole-wheat flour tortillas. Meats and other proteins Skinless chicken or [place]. Ground chicken or [place]. Pork with fat trimmed off. Fish and seafood. Egg whites. Dried beans, peas, or lentils. Unsalted nuts, nut butters, and seeds. Unsalted canned beans. Lean cuts of beef with fat trimmed off. Low-sodium, lean precooked or cured meat, such as sausages or meat loaves. Dairy Low-fat (1%) or fat-free (skim) milk. Reduced-fat, low-fat, or fat-free cheeses. Nonfat, low-sodium ricotta or cottage cheese. Low-fat or nonfat yogurt. Low-fat, low-sodium cheese. Fats and oils Soft margarine without trans fats. Vegetable oil. Reduced-fat, low-fat, or light mayonnaise and salad dressings (reduced-sodium). Canola, safflower, olive, avocado, soybean, and sunflower oils. Avocado. Seasonings and condiments Herbs. Spices. Seasoning mixes without salt. Other foods Unsalted popcorn and pretzels. Fat-free sweets. The items listed above may not be a complete list of foods and beverages you can eat. Contact a dietitian for more information. What foods should I avoid? Fruits Canned  fruit in a light or heavy syrup. Fried fruit. Fruit in cream or butter sauce. Vegetables Creamed or fried vegetables. Vegetables in a cheese sauce. Regular canned vegetables (not low-sodium or reduced-sodium). Regular canned tomato sauce and paste (not low-sodium or reduced-sodium). Regular tomato and vegetable juice (not low-sodium or reduced-sodium). Angie Fava. Olives. Grains Baked goods made with fat, such as croissants, muffins, or some breads. Dry pasta or rice meal packs. Meats and other proteins Fatty cuts of meat. Ribs. Fried meat. Berniece Salines. Bologna, salami, and other precooked or cured meats, such as sausages or meat loaves. Fat from the back of a pig (fatback). Bratwurst. Salted nuts and seeds. Canned beans with added salt. Canned or  smoked fish. Whole eggs or egg yolks. Chicken or [place] with skin. Dairy Whole or 2% milk, cream, and half-and-half. Whole or full-fat cream cheese. Whole-fat or sweetened yogurt. Full-fat cheese. Nondairy creamers. Whipped toppings. Processed cheese and cheese spreads. Fats and oils Butter. Stick margarine. Lard. Shortening. Ghee. Bacon fat. Tropical oils, such as coconut, palm kernel, or palm oil. Seasonings and condiments Onion salt, garlic salt, seasoned salt, table salt, and sea salt. Worcestershire sauce. Tartar sauce. Barbecue sauce. Teriyaki sauce. Soy sauce, including reduced-sodium. Steak sauce. Canned and packaged gravies. Fish sauce. Oyster sauce. Cocktail sauce. Store-bought horseradish. Ketchup. Mustard. Meat flavorings and tenderizers. Bouillon cubes. Hot sauces. Pre-made or packaged marinades. Pre-made or packaged taco seasonings. Relishes. Regular salad dressings. Other foods Salted popcorn and pretzels. The items listed above may not be a complete list of foods and beverages you should avoid. Contact a dietitian for more information. Where to find more information  National Heart, Lung, and Blood Institute: https://wilson-eaton.com/  American Heart Association: www.heart.org  Academy of Nutrition and Dietetics: www.eatright.[place]: www.kidney.org Summary  The DASH eating plan is a healthy eating plan that has been shown to reduce high blood pressure (hypertension). It may also reduce your risk for type 2 diabetes, heart disease, and stroke.  When on the DASH eating plan, aim to eat more fresh fruits and vegetables, whole grains, lean proteins, low-fat dairy, and heart-healthy fats.  With the DASH eating plan, you should limit salt (sodium) intake to 2,300 mg a day. If you have hypertension, you may need to reduce your sodium intake to 1,500 mg a day.  Work with your health care provider or dietitian to adjust your eating plan to your individual calorie  needs. This information is not intended to replace advice given to you by your health care provider. Make sure you discuss any questions you have with your health care provider. Document Revised: 07/06/2019 Document Reviewed: 07/06/2019 Elsevier Patient Education  2021 [employer].

## 2021-01-06 DIAGNOSIS — C3412 Malignant neoplasm of upper lobe, left bronchus or lung: Secondary | ICD-10-CM | POA: Diagnosis not present

## 2021-01-06 DIAGNOSIS — R918 Other nonspecific abnormal finding of lung field: Secondary | ICD-10-CM | POA: Diagnosis not present

## 2021-01-06 DIAGNOSIS — Z902 Acquired absence of lung [part of]: Secondary | ICD-10-CM | POA: Diagnosis not present

## 2021-01-06 DIAGNOSIS — Z85118 Personal history of other malignant neoplasm of bronchus and lung: Secondary | ICD-10-CM | POA: Diagnosis not present

## 2021-01-06 DIAGNOSIS — C3492 Malignant neoplasm of unspecified part of left bronchus or lung: Secondary | ICD-10-CM | POA: Diagnosis not present

## 2021-01-06 DIAGNOSIS — Z08 Encounter for follow-up examination after completed treatment for malignant neoplasm: Secondary | ICD-10-CM | POA: Diagnosis not present

## 2021-01-08 DIAGNOSIS — M7502 Adhesive capsulitis of left shoulder: Secondary | ICD-10-CM | POA: Diagnosis not present

## 2021-01-15 DIAGNOSIS — M1712 Unilateral primary osteoarthritis, left knee: Secondary | ICD-10-CM | POA: Diagnosis not present

## 2021-02-23 ENCOUNTER — Telehealth: Payer: Self-pay | Admitting: *Deleted

## 2021-02-23 NOTE — Telephone Encounter (Signed)
   [facility] Pre-operative Risk Assessment    Patient Name: Marissa Jacobs  DOB: [birth year] MRN: [id number]  HEARTCARE STAFF:  - IMPORTANT!!!!!! Under Visit Info/Reason for Call, type in Other and utilize the format Clearance MM/DD/YY or Clearance TBD. Do not use dashes or single digits. - Please review there is not already an duplicate clearance open for this procedure. - If request is for dental extraction, please clarify the # of teeth to be extracted. - If the patient is currently at the dentist's office, call Pre-Op Callback Staff (MA/nurse) to input urgent request.  - If the patient is not currently in the dentist office, please route to the Pre-Op pool.  Request for surgical clearance:  What type of surgery is being performed? LEFT TOTAL KNEE ARTHROPLASTY  When is this surgery scheduled? 06/08/21  What type of clearance is required (medical clearance vs. Pharmacy clearance to hold med vs. Both)? MEDICAL  Are there any medications that need to be held prior to surgery and how long? NONE LISTED   Practice name and name of physician performing surgery? [facility]; DR. Gaynelle Arabian  What is the office phone number? [phone]   7.   What is the office fax number? [phone] ATTN: [place]  8.   Anesthesia type (None, local, MAC, general) ? CHOICE   Julaine Hua 02/23/2021, 3:12 PM  _________________________________________________________________   (provider comments below)

## 2021-02-23 NOTE — Telephone Encounter (Signed)
   Name: Marissa Jacobs  DOB: 12/04/1935  MRN: [id number]   Primary Cardiologist: Sinclair Grooms, MD  Chart reviewed as part of pre-operative protocol coverage. Patient was contacted 02/23/2021 in reference to pre-operative risk assessment for pending surgery as outlined below.  RHILEE CURRIN was last seen on 12/22/20 by Dr. Tamala Julian.  Since that day, ALONIE GAZZOLA has done well. She exercises at the gym twice weekly without angina. She does not have a history of ischemic heart disease. Reassuring stress test in 2019. She may hold ASA if needed for surgery.   Therefore, based on ACC/AHA guidelines, the patient would be at acceptable risk for the planned procedure without further cardiovascular testing.   The patient was advised that if she develops new symptoms prior to surgery to contact our office to arrange for a follow-up visit, and she verbalized understanding.  I will route this recommendation to the requesting party via Epic fax function and remove from pre-op pool. Please call with questions.  Tami Lin Mada Sadik, PA 02/23/2021, 5:25 PM

## 2021-03-11 ENCOUNTER — Emergency Department (HOSPITAL_COMMUNITY): Payer: Medicare HMO

## 2021-03-11 ENCOUNTER — Emergency Department (HOSPITAL_COMMUNITY)
Admission: EM | Admit: 2021-03-11 | Discharge: 2021-03-12 | Disposition: A | Discharge status: Home / Self Care | Payer: Medicare HMO | Attending: Emergency Medicine | Admitting: Emergency Medicine

## 2021-03-11 ENCOUNTER — Other Ambulatory Visit: Payer: Self-pay

## 2021-03-11 ENCOUNTER — Telehealth: Payer: Self-pay | Admitting: Physician Assistant

## 2021-03-11 ENCOUNTER — Encounter (HOSPITAL_COMMUNITY): Payer: Self-pay | Admitting: *Deleted

## 2021-03-11 DIAGNOSIS — Z79899 Other long term (current) drug therapy: Secondary | ICD-10-CM | POA: Insufficient documentation

## 2021-03-11 DIAGNOSIS — R110 Nausea: Secondary | ICD-10-CM | POA: Insufficient documentation

## 2021-03-11 DIAGNOSIS — K449 Diaphragmatic hernia without obstruction or gangrene: Secondary | ICD-10-CM | POA: Diagnosis not present

## 2021-03-11 DIAGNOSIS — I10 Essential (primary) hypertension: Secondary | ICD-10-CM | POA: Diagnosis not present

## 2021-03-11 DIAGNOSIS — R0789 Other chest pain: Secondary | ICD-10-CM | POA: Diagnosis not present

## 2021-03-11 DIAGNOSIS — R42 Dizziness and giddiness: Secondary | ICD-10-CM | POA: Diagnosis not present

## 2021-03-11 DIAGNOSIS — I700 Atherosclerosis of aorta: Secondary | ICD-10-CM | POA: Diagnosis not present

## 2021-03-11 DIAGNOSIS — Z87891 Personal history of nicotine dependence: Secondary | ICD-10-CM | POA: Insufficient documentation

## 2021-03-11 DIAGNOSIS — R079 Chest pain, unspecified: Secondary | ICD-10-CM

## 2021-03-11 LAB — CBC WITH DIFFERENTIAL/PLATELET
Abs Immature Granulocytes: 0.02 10*3/uL (ref 0.00–0.07)
Basophils Absolute: 0 10*3/uL (ref 0.0–0.1)
Basophils Relative: 1 %
Eosinophils Absolute: 0.2 10*3/uL (ref 0.0–0.5)
Eosinophils Relative: 3 %
HCT: 39 % (ref 36.0–46.0)
Hemoglobin: 13 g/dL (ref 12.0–15.0)
Immature Granulocytes: 0 %
Lymphocytes Relative: 22 %
Lymphs Abs: 1.6 10*3/uL (ref 0.7–4.0)
MCH: 29.7 pg (ref 26.0–34.0)
MCHC: 33.3 g/dL (ref 30.0–36.0)
MCV: 89.2 fL (ref 80.0–100.0)
Monocytes Absolute: 0.7 10*3/uL (ref 0.1–1.0)
Monocytes Relative: 9 %
Neutro Abs: 4.6 10*3/uL (ref 1.7–7.7)
Neutrophils Relative %: 65 %
Platelets: 340 10*3/uL (ref 150–400)
RBC: 4.37 MIL/uL (ref 3.87–5.11)
RDW: 13.6 % (ref 11.5–15.5)
WBC: 7.1 10*3/uL (ref 4.0–10.5)
nRBC: 0 % (ref 0.0–0.2)

## 2021-03-11 LAB — BASIC METABOLIC PANEL
Anion gap: 8 (ref 5–15)
BUN: 7 mg/dL — ABNORMAL LOW (ref 8–23)
CO2: 27 mmol/L (ref 22–32)
Calcium: 8.9 mg/dL (ref 8.9–10.3)
Chloride: 95 mmol/L — ABNORMAL LOW (ref 98–111)
Creatinine, Ser: 0.63 mg/dL (ref 0.44–1.00)
GFR, Estimated: >60 mL/min (ref >60)
Glucose, Bld: 88 mg/dL (ref 70–99)
Potassium: 3.7 mmol/L (ref 3.5–5.1)
Sodium: 130 mmol/L — ABNORMAL LOW (ref 135–145)

## 2021-03-11 NOTE — ED Triage Notes (Signed)
Pt reports while sitting at her desk, she had onset of a tightness in her chest, nausea, dizziness, lasting 20-30 minutes. Pain now subsided.

## 2021-03-11 NOTE — Telephone Encounter (Signed)
   The patient called the answering service after-hours today. She was working in her office and suddenly developed onset of nausea and chest tightness and chest pain. Has an Apple watch on and HR seemed normal, 78bpm. Her symptoms persisted for about 30 minutes and she felt very scared. Has never felt anything like this before, happened out the blue, felt like she was going to be sick. She feels a little better now but still spooked about what happened as it was fairly pronounced. We discussed that it is difficult to evaluate CP over the phone but given the new onset of symptoms and severity it is prudent to proceed to ER. She is aware not to drive herself. The patient verbalized understanding and gratitude.  I also see the patient had a recent pre-op clearance performed on 02/23/21 for left total knee replacement 06/08/21. She will now certainly need an appointment before clearance can be provided given the updated events. Will route to callback team to let surgeon know and to schedule her a post-ER visit appt for evaluation.   Charlie Pitter, PA-C

## 2021-03-11 NOTE — ED Provider Notes (Signed)
Emergency Medicine Provider Triage Evaluation Note  Marissa Jacobs , a 85 y.o. female  was evaluated in triage.  Pt complains of chest pain, started around 6 today, lasted about 30 minutes, resolved on its own, pain was in the middle of her chest, does not radiate, she felt nauseous and lightheaded, denies paresthesias weakness upper lower extremities, she has no significant cardiac history, no history of PEs or DVTs, denies illicit drug use.  Has no chest pain at this time.  She sees Dr. Tamala Julian of cardiology.  Noted the patient has an elevated blood pressure, patient states she took all of her blood pressure medication today, she denies head trauma, headaches, change in vision, paresthesia or weakness of her lower extremities.  Review of Systems  Positive: Chest pain, nausea Negative: Shortness of breath, abdominal pain  Physical Exam  BP (!) 190/77 (BP Location: Left Arm)   Pulse 70   Temp 98 F (36.7 C) (Oral)   Resp 16   LMP 08/16/1998   SpO2 100%  Gen:   Awake, no distress   Resp:  Normal effort  MSK:   Moves extremities without difficulty  Other:  Faces is asymmetric, no unilateral weakness, no difficulty word finding, no focal deficits present.  Medical Decision Making  Medically screening exam initiated at 8:52 PM.  Appropriate orders placed.  Marissa Jacobs was informed that the remainder of the evaluation will be completed by another provider, this initial triage assessment does not replace that evaluation, and the importance of remaining in the ED until their evaluation is complete.  Patient presents with chest pain since resolved patient will need further work-up.   Marcello Fennel, PA-C 39/03/00 [id number]    Delora Fuel, MD 00/76/22 [phone]

## 2021-03-11 NOTE — Telephone Encounter (Signed)
See updated phone note today - patient had chest pain, going to ER, have routed that note further to callback to reach out to surgeon to retract clearance and get f/u appt before clearing.

## 2021-03-12 DIAGNOSIS — R0789 Other chest pain: Secondary | ICD-10-CM | POA: Diagnosis not present

## 2021-03-12 DIAGNOSIS — I10 Essential (primary) hypertension: Secondary | ICD-10-CM | POA: Diagnosis not present

## 2021-03-12 LAB — TROPONIN I (HIGH SENSITIVITY)
Troponin I (High Sensitivity): 7 ng/L (ref <18)
Troponin I (High Sensitivity): 7 ng/L (ref <18)

## 2021-03-12 MED ORDER — NITROGLYCERIN 0.4 MG SL SUBL: 0.4000 mg | SUBLINGUAL_TABLET | SUBLINGUAL | 0 refills | Status: AC | PRN

## 2021-03-12 MED ORDER — LOSARTAN POTASSIUM 50 MG PO TABS
50.0000 mg | ORAL_TABLET | Freq: Once | ORAL | Status: DC
  Filled 2021-03-12: qty 1

## 2021-03-12 MED ORDER — ASPIRIN 81 MG PO CHEW
324.0000 mg | CHEWABLE_TABLET | Freq: Once | ORAL | Status: AC
  Administered 2021-03-12: 324 mg via ORAL
  Filled 2021-03-12: qty 4

## 2021-03-12 NOTE — ED Notes (Signed)
Second troponin sent

## 2021-03-12 NOTE — Telephone Encounter (Signed)
Marveen Reeks RN for Dr. Tamala Julian is reviewing schedule for appt due to recent chest pain and ER visit.

## 2021-03-12 NOTE — Telephone Encounter (Signed)
Left detailed message for pt that she is scheduled to see Dr. Tamala Julian for ED follow-up / Preop Eval, 03/24/2021, to arrive at 2:15 for a 2:30 appointment.  Asked pt to either send a mychart message or to call the office to confirm.  Will route back to the requesting surgeons' office to make them aware that pt will need to be seen before cleared for surgery, due to the recent events and clearance will be addressed at upcoming appointment.

## 2021-03-12 NOTE — ED Provider Notes (Signed)
[facility] EMERGENCY DEPARTMENT Provider Note   CSN: [id number] Arrival date & time: 03/11/21  1927     History Chief Complaint  Patient presents with   Chest Pain    Marissa Jacobs is a 85 y.o. female.  The history is provided by the patient.  Chest Pain She has history of hypertension, hyperlipidemia, lung cancer and comes in because of an episode of chest heaviness.  This came on while she was walking around in her office.  The heavy feeling was rated at 9/10.  Nothing made it better, nothing made it worse.  It lasted about 20 minutes before resolving.  There was associated nausea but no dyspnea or diaphoresis.  She does not recall ever having an episode like this.  She has felt fine since the heavy feeling resolved.  She is non-smoker and denies history of diabetes.  She is followed by cardiology for postoperative atrial fibrillation without recurrence, not on any anticoagulants.   Past Medical History:  Diagnosis Date   Anemia    secondary to taking omeprazole-on iron   Diverticulosis 2/04   GERD (gastroesophageal reflux disease)    Hyperlipidemia    Hypertension    Lung nodule    Osteopenia    Post traumatic stress disorder    STD (sexually transmitted disease)    HSV    Patient Active Problem List   Diagnosis Date Noted   Pulmonary nodules/lesions, multiple 05/05/2020   Bilateral hearing loss 05/29/2019   Osteoarthritis of knee 06/27/2018   Atypical chest pain 05/02/2018   Hypertensive disorder 05/02/2018   GERD with stricture 11/01/2014   Hyperlipidemia 07/28/2012    Past Surgical History:  Procedure Laterality Date   APPENDECTOMY     CERVICAL BIOPSY  W/ LOOP ELECTRODE EXCISION     CIN1   COLPOSCOPY     ESOPHAGEAL DILATION  2013   FACIAL COSMETIC SURGERY  9/03   KNEE SURGERY     TONSILLECTOMY       OB History     Gravida  2   Para  1   Term  1   Preterm      AB  1   Living  1      SAB      IAB      Ectopic       Multiple      Live Births              Family History  Problem Relation Age of Onset   COPD Mother    Sudden death Father 61   Healthy Brother     Social History   Tobacco Use   Smoking status: Former    Types: Cigarettes    Quit date: 02/21/1963    Years since quitting: 58.0   Smokeless tobacco: Never   Tobacco comments:    in college  Vaping Use   Vaping Use: Never used  Substance Use Topics   Alcohol use: Yes    Alcohol/week: 5.0 standard drinks    Types: 5 Standard drinks or equivalent per week   Drug use: No    Home Medications Prior to Admission medications   Medication Sig Start Date End Date Taking? Authorizing Provider  Ascorbic Acid (VITAMIN C) 1000 MG tablet 1 tablet    [provider]  CALCIUM PO Take 600 mg by mouth 2 (two) times daily.     [provider]  Cholecalciferol (VITAMIN D PO) Take 2,500 mg by mouth  every other day.     [provider]  Coenzyme Q10 (CO Q 10 PO) Take by mouth daily.    [provider]  EPIPEN 2-PAK 0.3 MG/0.3ML SOAJ injection  11/05/16   [provider]  estradiol (VIVELLE-DOT) 0.025 MG/24HR Place 1 patch onto the skin 2 (two) times a week. 05/29/20   Megan Salon, MD  fexofenadine (ALLEGRA) 180 MG tablet Take 180 mg by mouth once a week.    [provider]  folic acid (FOLVITE) 893 MCG tablet Take 400 mcg by mouth daily.    [provider]  Grape Seed Extract 100 MG CAPS 1 capsule    [provider]  hydrochlorothiazide (MICROZIDE) 12.5 MG capsule Take 12.5 mg by mouth daily.    [provider]  losartan (COZAAR) 50 MG tablet Take 1 tablet (50 mg total) by mouth daily. 10/27/20   Belva Crome, MD  Magnesium 300 MG CAPS Take 300 mg by mouth daily.    [provider]  Omega-3 Fatty Acids (FISH OIL PO) Take 600 mg by mouth daily.     [provider]  omeprazole (PRILOSEC) 10 MG capsule Take 10 mg by mouth daily. 04/04/16    [provider]  omeprazole (PRILOSEC) 10 MG capsule Take 10 mg by mouth 2 (two) times daily.    [provider]  PROMETRIUM 100 MG capsule TAKE 1 CAPSULE BY MOUTH EVERY DAY 10/10/20   Megan Salon, MD  pyridOXINE (VITAMIN B-6) 100 MG tablet 1 tablet    [provider]  Quercetin 50 MG TABS Take by mouth.    [provider]  tretinoin (RETIN-A) 0.025 % cream Apply 1 application topically at bedtime.  02/05/13   [provider]  zinc gluconate 50 MG tablet Take by mouth.    [provider]  PROMETRIUM 100 MG capsule Take 1 capsule (100 mg total) by mouth daily. 05/23/19 05/29/20  Megan Salon, MD    Allergies    Abobotulinumtoxina, Codeine, and Amoxicillin  Review of Systems   Review of Systems  Cardiovascular:  Positive for chest pain.  All other systems reviewed and are negative.  Physical Exam Updated Vital Signs BP (!) 181/87   Pulse 66   Temp 98 F (36.7 C) (Oral)   Resp 10   Ht 5\' 1"  (1.549 m)   Wt 63.5 kg   LMP 08/16/1998   SpO2 100%   BMI 26.45 kg/m   Physical Exam Vitals and nursing note reviewed.  85 year old female, resting comfortably and in no acute distress. Vital signs are significant for elevated blood pressure. Oxygen saturation is 100%, which is normal. Head is normocephalic and atraumatic. PERRLA, EOMI. Oropharynx is clear. Neck is nontender and supple without adenopathy or JVD. Back is nontender and there is no CVA tenderness. Lungs are clear without rales, wheezes, or rhonchi. Chest is nontender. Heart has regular rate and rhythm without murmur. Abdomen is soft, flat, nontender without masses or hepatosplenomegaly and peristalsis is normoactive. Extremities have no cyanosis or edema, full range of motion is present. Skin is warm and dry without rash. Neurologic: Mental status is normal, cranial nerves are intact, there are no motor or sensory deficits.  ED Results / Procedures / Treatments    Labs (all labs ordered are listed, but only abnormal results are displayed) Labs Reviewed  BASIC METABOLIC PANEL - Abnormal; Notable for the following components:      Result Value   Sodium 130 (*)  Chloride 95 (*)    BUN 7 (*)    All other components within normal limits  CBC WITH DIFFERENTIAL/PLATELET  TROPONIN I (HIGH SENSITIVITY)  TROPONIN I (HIGH SENSITIVITY)    EKG EKG Interpretation  Date/Time:  Wednesday March 11 2021 20:43:08 EDT Ventricular Rate:  72 PR Interval:  146 QRS Duration: 70 QT Interval:  402 QTC Calculation: 440 R Axis:   38 Text Interpretation: Normal sinus rhythm Cannot rule out Anterior infarct , age undetermined Abnormal ECG When compared with ECG of 09/10/2020, No significant change was found Confirmed by Delora Fuel ([id number]) on 03/12/2021 2:31:17 AM  Radiology DG Chest 2 View  Result Date: 03/11/2021 CLINICAL DATA:  Chest tightness with nausea and dizziness. EXAM: CHEST - 2 VIEW COMPARISON:  September 10, 2020 FINDINGS: Mild, diffuse, chronic appearing increased interstitial lung markings are seen. Mild areas of scarring and/or atelectasis are seen within the left lung base. There is no evidence of a pleural effusion or pneumothorax. The heart size and mediastinal contours are within normal limits. There is marked severity calcification of the aortic arch. A small hiatal hernia is noted. Multilevel degenerative changes seen throughout the thoracic spine. IMPRESSION: 1. Mild left basilar scarring and/or atelectasis. 2. Small hiatal hernia. Electronically Signed   By: Virgina Norfolk M.D.   On: 03/11/2021 21:17    Procedures Procedures   Medications Ordered in ED Medications  aspirin chewable tablet 324 mg (has no administration in time range)  losartan (COZAAR) tablet 50 mg (has no administration in time range)    ED Course  I have reviewed the triage vital signs and the nursing notes.  Pertinent labs & imaging results that were available during  my care of the patient were reviewed by me and considered in my medical decision making (see chart for details).   MDM Rules/Calculators/A&P                         Chest heaviness of uncertain cause.  She does have some risk factors for coronary artery disease.  Old records were reviewed and she had a coronary CT scan done in January 2021 which showed nonobstructive disease with a greatest obstruction being in the LAD measured at 25-49%.  ECG shows no acute process.  Initial troponin is normal.  Delta troponin is pending.  Other labs show mild hyponatremia which is not felt to be clinically significant.  She is given a dose of aspirin.  Anticipate referral back to her cardiologist if second troponin is normal.  Repeat troponin is unchanged.  She is discharged with a prescription for nitroglycerin tablets to use as needed, told to take low-dose aspirin daily until she sees her cardiologist.  Advised to continue monitoring her blood pressure at home.  Final Clinical Impression(s) / ED Diagnoses Final diagnoses:  Nonspecific chest pain  Elevated blood pressure reading with diagnosis of hypertension    Rx / DC Orders ED Discharge Orders          Ordered    nitroGLYCERIN (NITROSTAT) 0.4 MG SL tablet  Every 5 min PRN        03/12/21 4967             Delora Fuel, MD 59/16/38 0530

## 2021-03-12 NOTE — Telephone Encounter (Signed)
Pt has appt 03/24/21 with Dr. Tamala Julian for chest pain and pre op clearance to be addressed. Will forward notes to MD for upcoming appt. Will send FYI to surgeon's office pt has appt 03/24/21.

## 2021-03-12 NOTE — Telephone Encounter (Signed)
Left detailed message for pt that she is scheduled to see Dr. Tamala Julian for ED follow-up / Preop Eval, 03/24/2021, to arrive at 2:15 for a 2:30 appointment.  Asked pt to either send a mychart message or to call the office to confirm.   Pt returned my call as I was typing this phone note.  She is aware of her follow-up appointment with Dr. Tamala Julian 03/24/2021 and thanked me for the call.

## 2021-03-12 NOTE — Discharge Instructions (Signed)
Please continue to monitor your blood pressure once a day.  If your blood pressure is staying consistently elevated over the course of a week, then talk with your primary care provider about adjusting your blood pressure medication.  Please take 1 aspirin 81 mg tablet a day until you see your cardiologist.  If you have another episode of the heavy feeling in your chest, take the nitroglycerin tablet.  You may take nitroglycerin up to every 5 minutes for a maximum of 3 doses.  If you still have the heavy feeling after 3 doses of nitroglycerin, return to the emergency department immediately.

## 2021-03-23 NOTE — Progress Notes (Signed)
Cardiology Office Note:    Date:  03/24/2021   ID:  Marissa Jacobs, DOB 11/11/1935, MRN [id number]  PCP:  Shon Baton, MD  Cardiologist:  Sinclair Grooms, MD   Referring MD: Shon Baton, MD   No chief complaint on file.   History of Present Illness:    Marissa Jacobs is a 85 y.o. female with a hx of primary HTN, lung cancer s/p left upper lobe posterior segmentectomy (January 2022 @ [facility]), CAD (non-obstructive by CTA), post operative atrial fibrillation, hyperlipidemia, and primary hypertension.  Here today for follow-up of emergency room visit for chest pain on 03/11/2021    Marissa Jacobs is doing well now.  She was seen in the emergency room on 03/11/2021 because spontaneous onset of precordial chest discomfort.  It lasted approximately 30 minutes before resolving.  She was seen in the emergency room where serial troponin EKGs were done and were unremarkable.  She has a background history of moderate nonobstructive atherosclerosis identified by coronary CTA in 2021 as noted above.  Since the emergency room evaluation, she has felt well.  She has been able to exercise.  And she has had no recurrence of discomfort.  Past Medical History:  Diagnosis Date   Anemia    secondary to taking omeprazole-on iron   Diverticulosis 2/04   GERD (gastroesophageal reflux disease)    Hyperlipidemia    Hypertension    Lung nodule    Osteopenia    Post traumatic stress disorder    STD (sexually transmitted disease)    HSV    Past Surgical History:  Procedure Laterality Date   APPENDECTOMY     CERVICAL BIOPSY  W/ LOOP ELECTRODE EXCISION     CIN1   COLPOSCOPY     ESOPHAGEAL DILATION  2013   FACIAL COSMETIC SURGERY  9/03   KNEE SURGERY     TONSILLECTOMY      Current Medications: Current Meds  Medication Sig   Ascorbic Acid (VITAMIN C) 1000 MG tablet 1 tablet   CALCIUM PO Take 600 mg by mouth 2 (two) times daily.    Cholecalciferol (VITAMIN D PO) Take 2,500 mg by mouth every other day.     Coenzyme Q10 (CO Q 10 PO) Take by mouth daily.   EPIPEN 2-PAK 0.3 MG/0.3ML SOAJ injection    estradiol (VIVELLE-DOT) 0.025 MG/24HR Place 1 patch onto the skin 2 (two) times a week.   fexofenadine (ALLEGRA) 180 MG tablet Take 180 mg by mouth once a week.   folic acid (FOLVITE) 409 MCG tablet Take 400 mcg by mouth daily.   Grape Seed Extract 100 MG CAPS 1 capsule   hydrochlorothiazide (MICROZIDE) 12.5 MG capsule Take 12.5 mg by mouth daily.   losartan (COZAAR) 50 MG tablet Take 1 tablet (50 mg total) by mouth daily.   Magnesium 300 MG CAPS Take 300 mg by mouth daily.   nitroGLYCERIN (NITROSTAT) 0.4 MG SL tablet Place 1 tablet (0.4 mg total) under the tongue every 5 (five) minutes as needed for chest pain.   Omega-3 Fatty Acids (FISH OIL PO) Take 600 mg by mouth daily.    progesterone (PROMETRIUM) 100 MG capsule Take 100 mg by mouth daily.   pyridOXINE (VITAMIN B-6) 100 MG tablet 1 tablet   Quercetin 50 MG TABS Take by mouth.   Red Yeast Rice Extract (RED YEAST RICE PO) Take by mouth.   tretinoin (RETIN-A) 0.025 % cream Apply 1 application topically at bedtime.    Vitamin D-Vitamin K (VITAMIN  K2-VITAMIN D3 PO) Take 50 mg by mouth daily.   zinc gluconate 50 MG tablet Take by mouth.     Allergies:   Abobotulinumtoxina, Codeine, and Amoxicillin   Social History   Socioeconomic History   Marital status: Divorced    Spouse name: Not on file   Number of children: Not on file   Years of education: Not on file   Highest education level: Not on file  Occupational History   Not on file  Tobacco Use   Smoking status: Former    Types: Cigarettes    Quit date: 02/21/1963    Years since quitting: 58.1   Smokeless tobacco: Never   Tobacco comments:    in college  Vaping Use   Vaping Use: Never used  Substance and Sexual Activity   Alcohol use: Yes    Alcohol/week: 5.0 standard drinks    Types: 5 Standard drinks or equivalent per week   Drug use: No   Sexual activity: Yes    Partners: Male     Birth control/protection: Post-menopausal  Other Topics Concern   Not on file  Social History Narrative   Not on file   Social Determinants of Health   Financial Resource Strain: Not on file  Food Insecurity: Not on file  Transportation Needs: Not on file  Physical Activity: Not on file  Stress: Not on file  Social Connections: Not on file     Family History: The patient's family history includes COPD in her mother; Healthy in her brother; Sudden death (age of onset: 34) in her father.  ROS:   Please see the history of present illness.  Prior history of chest pain.  Previous negative work-up.  Recent resection of lung via robotic surgery performed at [facility].  All other systems reviewed and are negative.  EKGs/Labs/Other Studies Reviewed:    The following studies were reviewed today:  Coronary CT with morphology September 13, 2019:  IMPRESSION: 1. Coronary calcium score of 97.2. This was 43rd percentile for age and sex matched control.   2. Normal coronary origin with right dominance.   3. Mild, calcified, non obstructive disease in the proximal to mid LAD.   4. CAD-RADS 2. Mild non-obstructive CAD (25-49%). Consider non-atherosclerotic causes of chest pain. Consider preventive therapy and risk factor modification.  EKG:  EKG normal sinus rhythm with normal EKG appearance.  Recent Labs: 03/11/2021: BUN 7; Creatinine, Ser 0.63; Hemoglobin 13.0; Platelets 340; Potassium 3.7; Sodium 130  Recent Lipid Panel No results found for: CHOL, TRIG, HDL, CHOLHDL, VLDL, LDLCALC, LDLDIRECT  Physical Exam:    VS:  BP 140/74   Pulse 72   Ht 5\' 1"  (1.549 m)   Wt 144 lb 12.8 oz (65.7 kg)   LMP 08/16/1998   SpO2 98%   BMI 27.36 kg/m     Wt Readings from Last 3 Encounters:  03/24/21 144 lb 12.8 oz (65.7 kg)  03/12/21 140 lb (63.5 kg)  12/22/20 142 lb 12.8 oz (64.8 kg)     GEN: Slightly overweight. No acute distress HEENT: Normal NECK: No JVD. LYMPHATICS: No  lymphadenopathy CARDIAC: No murmur. RRR no gallop, or edema. VASCULAR:  Normal Pulses. No bruits. RESPIRATORY:  Clear to auscultation without rales, wheezing or rhonchi  ABDOMEN: Soft, non-tender, non-distended, No pulsatile mass, MUSCULOSKELETAL: No deformity  SKIN: Warm and dry NEUROLOGIC:  Alert and oriented x 3 PSYCHIATRIC:  Normal affect   ASSESSMENT:    1. Chest discomfort   2. PAF (paroxysmal atrial  fibrillation) ([place])   3. Essential hypertension   4. CAD in native artery   5. Pure hyperglyceridemia    PLAN:    In order of problems listed above:  No evidence of infarction.  EKG is unchanged.  Stress Myoview study will be done to rule out any fixed defects or evidence of ischemia.  Continue current therapy as noted. A. fib was not noted at the time of the recent episode. Blood pressures are in a relatively high normal range for her age. Continue preventive therapy.  Not on statin therapy.  Most recent LDL in October was 113.   Medication Adjustments/Labs and Tests Ordered: Current medicines are reviewed at length with the patient today.  Concerns regarding medicines are outlined above.  Orders Placed This Encounter  Procedures   MYOCARDIAL PERFUSION IMAGING   EKG 12-Lead   No orders of the defined types were placed in this encounter.   There are no Patient Instructions on file for this visit.   Signed, Sinclair Grooms, MD  03/24/2021 3:20 PM    [facility]

## 2021-03-24 ENCOUNTER — Encounter: Payer: Self-pay | Admitting: Interventional Cardiology

## 2021-03-24 ENCOUNTER — Ambulatory Visit: Payer: Medicare HMO | Admitting: Interventional Cardiology

## 2021-03-24 ENCOUNTER — Encounter: Payer: Self-pay | Admitting: *Deleted

## 2021-03-24 ENCOUNTER — Other Ambulatory Visit: Payer: Self-pay

## 2021-03-24 VITALS — BP 140/74 | HR 72 | Ht 61.0 in | Wt 144.8 lb

## 2021-03-24 DIAGNOSIS — R0789 Other chest pain: Secondary | ICD-10-CM | POA: Diagnosis not present

## 2021-03-24 DIAGNOSIS — E781 Pure hyperglyceridemia: Secondary | ICD-10-CM

## 2021-03-24 DIAGNOSIS — I480 Paroxysmal atrial fibrillation: Secondary | ICD-10-CM | POA: Diagnosis not present

## 2021-03-24 DIAGNOSIS — I2510 Atherosclerotic heart disease of native coronary artery without angina pectoris: Secondary | ICD-10-CM

## 2021-03-24 DIAGNOSIS — I10 Essential (primary) hypertension: Secondary | ICD-10-CM | POA: Diagnosis not present

## 2021-03-24 NOTE — Patient Instructions (Signed)
Medication Instructions:  Your physician recommends that you continue on your current medications as directed. Please refer to the Current Medication list given to you today.  *If you need a refill on your cardiac medications before your next appointment, please call your pharmacy*   Lab Work: None If you have labs (blood work) drawn today and your tests are completely normal, you will receive your results only by: [place] (if you have MyChart) OR A paper copy in the mail If you have any lab test that is abnormal or we need to change your treatment, we will call you to review the results.   Testing/Procedures: Your physician has requested that you have en exercise stress myoview. For further information please visit [email or web address]. Please follow instruction sheet, as given.   Follow-Up: At [facility], you and your health needs are our priority.  As part of our continuing mission to provide you with exceptional heart care, we have created designated Provider Care Teams.  These Care Teams include your primary Cardiologist (physician) and Advanced Practice Providers (APPs -  Physician Assistants and Nurse Practitioners) who all work together to provide you with the care you need, when you need it.  We recommend signing up for the patient portal called "MyChart".  Sign up information is provided on this After Visit Summary.  MyChart is used to connect with patients for Virtual Visits (Telemedicine).  Patients are able to view lab/test results, encounter notes, upcoming appointments, etc.  Non-urgent messages can be sent to your provider as well.   To learn more about what you can do with MyChart, go to [email or web address].    Your next appointment:   1 year(s)  The format for your next appointment:   In Person  Provider:   You may see Sinclair Grooms, MD or one of the following Advanced Practice Providers on your designated Care Team:   Cecilie Kicks, NP   Other  Instructions

## 2021-03-25 ENCOUNTER — Telehealth (HOSPITAL_COMMUNITY): Payer: Self-pay | Admitting: *Deleted

## 2021-03-25 NOTE — Telephone Encounter (Signed)
Patient given detailed instructions per Myocardial Perfusion Study Information Sheet for the test on 03/31/21.  Patient notified to arrive 15 minutes early and that it is imperative to arrive on time for appointment to keep from having the test rescheduled.  If you need to cancel or reschedule your appointment, please call the office within 24 hours of your appointment. . Patient verbalized understanding. Kirstie Peri

## 2021-04-02 ENCOUNTER — Other Ambulatory Visit: Payer: Self-pay

## 2021-04-02 ENCOUNTER — Ambulatory Visit (HOSPITAL_COMMUNITY): Payer: Medicare HMO | Attending: Cardiovascular Disease

## 2021-04-02 DIAGNOSIS — I2510 Atherosclerotic heart disease of native coronary artery without angina pectoris: Secondary | ICD-10-CM | POA: Diagnosis not present

## 2021-04-02 LAB — MYOCARDIAL PERFUSION IMAGING
Estimated workload: 4.6 METS
Exercise duration (min): 4 min
Exercise duration (sec): 4 s
LV dias vol: 45 mL (ref 46–106)
LV sys vol: 6 mL
MPHR: 137 {beats}/min
Peak HR: 141 {beats}/min
Percent HR: 102 %
Rest HR: 68 {beats}/min
SDS: 1
SRS: 0
SSS: 1
TID: 0.76

## 2021-04-02 MED ORDER — TECHNETIUM TC 99M TETROFOSMIN IV KIT
29.7000 | PACK | Freq: Once | INTRAVENOUS | Status: AC | PRN
  Administered 2021-04-02: 29.7 via INTRAVENOUS
  Filled 2021-04-02: qty 30

## 2021-04-02 MED ORDER — TECHNETIUM TC 99M TETROFOSMIN IV KIT
9.9000 | PACK | Freq: Once | INTRAVENOUS | Status: AC | PRN
  Administered 2021-04-02: 9.9 via INTRAVENOUS
  Filled 2021-04-02: qty 10

## 2021-04-09 ENCOUNTER — Telehealth: Payer: Self-pay | Admitting: Interventional Cardiology

## 2021-04-09 NOTE — Telephone Encounter (Signed)
   Pt is returning call, pt is in hair salon and asked if Anderson Malta can call her back in an hour

## 2021-04-09 NOTE — Telephone Encounter (Signed)
Spoke with pt and made her aware of stress test results.  Pt took her HCTZ at 8am and Losartan at 10am the morning of her test.  Losartan wouldn't have been kicked in in time for her test.  She states readings at home run anywhere from 116-150s/60s-70s.  Advised pt to monitor BP 2-3x per week for 2 weeks, at least 2 hours after Losartan and call with those readings so we can see what they look like on meds.  Pt agreeable to plan.

## 2021-05-05 DIAGNOSIS — K222 Esophageal obstruction: Secondary | ICD-10-CM | POA: Diagnosis not present

## 2021-05-05 DIAGNOSIS — K449 Diaphragmatic hernia without obstruction or gangrene: Secondary | ICD-10-CM | POA: Diagnosis not present

## 2021-05-05 DIAGNOSIS — R1319 Other dysphagia: Secondary | ICD-10-CM | POA: Diagnosis not present

## 2021-05-05 DIAGNOSIS — D509 Iron deficiency anemia, unspecified: Secondary | ICD-10-CM | POA: Diagnosis not present

## 2021-05-05 DIAGNOSIS — I2510 Atherosclerotic heart disease of native coronary artery without angina pectoris: Secondary | ICD-10-CM | POA: Diagnosis not present

## 2021-05-05 DIAGNOSIS — K219 Gastro-esophageal reflux disease without esophagitis: Secondary | ICD-10-CM | POA: Diagnosis not present

## 2021-05-05 DIAGNOSIS — C3492 Malignant neoplasm of unspecified part of left bronchus or lung: Secondary | ICD-10-CM | POA: Diagnosis not present

## 2021-05-05 DIAGNOSIS — Z887 Allergy status to serum and vaccine status: Secondary | ICD-10-CM | POA: Diagnosis not present

## 2021-05-05 DIAGNOSIS — Z91013 Allergy to seafood: Secondary | ICD-10-CM | POA: Diagnosis not present

## 2021-05-05 DIAGNOSIS — Z885 Allergy status to narcotic agent status: Secondary | ICD-10-CM | POA: Diagnosis not present

## 2021-05-05 DIAGNOSIS — I10 Essential (primary) hypertension: Secondary | ICD-10-CM | POA: Diagnosis not present

## 2021-05-05 DIAGNOSIS — Z79899 Other long term (current) drug therapy: Secondary | ICD-10-CM | POA: Diagnosis not present

## 2021-05-21 DIAGNOSIS — M1712 Unilateral primary osteoarthritis, left knee: Secondary | ICD-10-CM | POA: Diagnosis not present

## 2021-05-25 DIAGNOSIS — E785 Hyperlipidemia, unspecified: Secondary | ICD-10-CM | POA: Diagnosis not present

## 2021-05-25 DIAGNOSIS — E041 Nontoxic single thyroid nodule: Secondary | ICD-10-CM | POA: Diagnosis not present

## 2021-05-25 DIAGNOSIS — E559 Vitamin D deficiency, unspecified: Secondary | ICD-10-CM | POA: Diagnosis not present

## 2021-05-25 DIAGNOSIS — I10 Essential (primary) hypertension: Secondary | ICD-10-CM | POA: Diagnosis not present

## 2021-05-25 NOTE — Patient Instructions (Signed)
DUE TO COVID-19 ONLY ONE VISITOR IS ALLOWED TO COME WITH YOU AND STAY IN THE WAITING ROOM ONLY DURING PRE OP AND PROCEDURE DAY OF SURGERY IF YOU ARE GOING HOME AFTER SURGERY. IF YOU ARE SPENDING THE NIGHT 2 PEOPLE MAY VISIT WITH YOU IN YOUR PRIVATE ROOM AFTER SURGERY UNTIL VISITING  HOURS ARE OVER AT 8:00 PM AND 1 VISITORS CAN SPEND THE NIGHT.   YOU NEED TO HAVE A COVID 19 TEST ON_10/20__ THIS TEST MUST BE DONE BEFORE SURGERY,  COVID TESTING SITE  IS LOCATED AT [place], [place]. REMAIN IN YOUR CAR THIS IS A DRIVE UP TEST. AFTER YOUR COVID TEST PLEASE WEAR A MASK OUT IN PUBLIC AND SOCIAL DISTANCE AND [place] YOUR HANDS FREQUENTLY, ALSO ASK ALL YOUR CLOSE CONTACT PERSONS TO WEAR A MASK AND SOCIAL DISTANCE AND [place] THEIR HANDS FREQUENTLY ALSO.               Marissa Jacobs     Your procedure is scheduled on: 06/08/21   Report to [facility] Main  Entrance   Report to admitting at   6:40 AM     Call this number if you have problems the morning of surgery [phone]    No food after midnight.    You may have clear liquid until 6:30 AM.    At 6:00 AM drink pre surgery drink.   Nothing by mouth after 6:30 AM.   CLEAR LIQUID DIET   Foods Allowed                                                                     Foods Excluded                                                                                              liquids that you cannot  Plain Jell-O any favor except red or purple                                           see through such as: Fruit ices (not with fruit pulp)                                     milk, soups, orange juice  Iced Popsicles                                    All solid food Carbonated beverages, regular and diet  Cranberry, grape and apple juices Sports drinks like Gatorade Lightly seasoned clear broth or consume(fat free) Sugar     BRUSH YOUR TEETH MORNING OF SURGERY AND RINSE YOUR MOUTH OUT,  NO CHEWING GUM CANDY OR MINTS.     Take these medicines the morning of surgery with A SIP OF WATER: Omeprazole                                You may not have any metal on your body including hair pins and              piercings  Do not wear jewelry, make-up, lotions, powders or perfumes, deodorant             Do not wear nail polish on your fingernails.  Do not shave  48 hours prior to surgery.                 Do not bring valuables to the hospital. [place].  Contacts, dentures or bridgework may not be worn into surgery.  Leave suitcase in the car. After surgery it may be brought to your room.                  Please read over the following fact sheets you were given: _____________________________________________________________________             [facility] - Preparing for Surgery Before surgery, you can play an important role.  Because skin is not sterile, your skin needs to be as free of germs as possible.  You can reduce the number of germs on your skin by washing with CHG (chlorahexidine gluconate) soap before surgery.  CHG is an antiseptic cleaner which kills germs and bonds with the skin to continue killing germs even after washing. Please DO NOT use if you have an allergy to CHG or antibacterial soaps.  If your skin becomes reddened/irritated stop using the CHG and inform your nurse when you arrive at Short Stay. Do not shave (including legs and underarms) for at least 48 hours prior to the first CHG shower.    Please follow these instructions carefully:  1.  Shower with CHG Soap the night before surgery and the  morning of Surgery.  2.  If you choose to wash your hair, wash your hair first as usual with your  normal  shampoo.  3.  After you shampoo, rinse your hair and body thoroughly to remove the  shampoo.                            4.  Use CHG as you would any other liquid soap.  You can apply chg directly  to the skin  and wash                       Gently with a scrungie or clean washcloth.  5.  Apply the CHG Soap to your body ONLY FROM THE NECK DOWN.   Do not use on face/ open                           Wound or open sores. Avoid contact with eyes, ears mouth and genitals (private parts).  Wash face,  Genitals (private parts) with your normal soap.             6.  Wash thoroughly, paying special attention to the area where your surgery  will be performed.  7.  Thoroughly rinse your body with warm water from the neck down.  8.  DO NOT shower/wash with your normal soap after using and rinsing off  the CHG Soap.                9.  Pat yourself dry with a clean towel.            10.  Wear clean pajamas.            11.  Place clean sheets on your bed the night of your first shower and do not  sleep with pets. Day of Surgery : Do not apply any lotions/deodorants the morning of surgery.  Please wear clean clothes to the hospital/surgery center.  FAILURE TO FOLLOW THESE INSTRUCTIONS MAY RESULT IN THE CANCELLATION OF YOUR SURGERY PATIENT SIGNATURE_________________________________  NURSE SIGNATURE__________________________________  ________________________________________________________________________   Adam Phenix  An incentive spirometer is a tool that can help keep your lungs clear and active. This tool measures how well you are filling your lungs with each breath. Taking long deep breaths may help reverse or decrease the chance of developing breathing (pulmonary) problems (especially infection) following: A long period of time when you are unable to move or be active. BEFORE THE PROCEDURE  If the spirometer includes an indicator to show your best effort, your nurse or respiratory therapist will set it to a desired goal. If possible, sit up straight or lean slightly forward. Try not to slouch. Hold the incentive spirometer in an upright position. INSTRUCTIONS FOR USE  Sit on  the edge of your bed if possible, or sit up as far as you can in bed or on a chair. Hold the incentive spirometer in an upright position. Breathe out normally. Place the mouthpiece in your mouth and seal your lips tightly around it. Breathe in slowly and as deeply as possible, raising the piston or the ball toward the top of the column. Hold your breath for 3-5 seconds or for as long as possible. Allow the piston or ball to fall to the bottom of the column. Remove the mouthpiece from your mouth and breathe out normally. Rest for a few seconds and repeat Steps 1 through 7 at least 10 times every 1-2 hours when you are awake. Take your time and take a few normal breaths between deep breaths. The spirometer may include an indicator to show your best effort. Use the indicator as a goal to work toward during each repetition. After each set of 10 deep breaths, practice coughing to be sure your lungs are clear. If you have an incision (the cut made at the time of surgery), support your incision when coughing by placing a pillow or rolled up towels firmly against it. Once you are able to get out of bed, walk around indoors and cough well. You may stop using the incentive spirometer when instructed by your caregiver.  RISKS AND COMPLICATIONS Take your time so you do not get dizzy or light-headed. If you are in pain, you may need to take or ask for pain medication before doing incentive spirometry. It is harder to take a deep breath if you are having pain. AFTER USE Rest and breathe slowly and easily. It can be helpful to keep track of  a log of your progress. Your caregiver can provide you with a simple table to help with this. If you are using the spirometer at home, follow these instructions: [place] IF:  You are having difficultly using the spirometer. You have trouble using the spirometer as often as instructed. Your pain medication is not giving enough relief while using the  spirometer. You develop fever of 100.5 F (38.1 C) or higher. SEEK IMMEDIATE MEDICAL CARE IF:  You cough up bloody sputum that had not been present before. You develop fever of 102 F (38.9 C) or greater. You develop worsening pain at or near the incision site. MAKE SURE YOU:  Understand these instructions. Will watch your condition. Will get help right away if you are not doing well or get worse. Document Released: 12/13/2006 Document Revised: 10/25/2011 Document Reviewed: 02/13/2007 [facility] Patient Information 2014 [place], [place].   ________________________________________________________________________

## 2021-05-26 ENCOUNTER — Encounter (HOSPITAL_COMMUNITY): Payer: Self-pay

## 2021-05-26 ENCOUNTER — Encounter (HOSPITAL_COMMUNITY)
Admission: RE | Admit: 2021-05-26 | Discharge: 2021-05-26 | Disposition: A | Discharge status: Home / Self Care | Payer: Medicare HMO | Source: Ambulatory Visit | Attending: Orthopedic Surgery | Admitting: Orthopedic Surgery

## 2021-05-26 ENCOUNTER — Other Ambulatory Visit: Payer: Self-pay

## 2021-05-26 DIAGNOSIS — Z01818 Encounter for other preprocedural examination: Secondary | ICD-10-CM | POA: Insufficient documentation

## 2021-05-26 HISTORY — DX: Cardiac arrhythmia, unspecified: I49.9

## 2021-05-26 LAB — COMPREHENSIVE METABOLIC PANEL
ALT: 20 U/L (ref 0–44)
AST: 26 U/L (ref 15–41)
Albumin: 4.2 g/dL (ref 3.5–5.0)
Alkaline Phosphatase: 72 U/L (ref 38–126)
Anion gap: 7 (ref 5–15)
BUN: 11 mg/dL (ref 8–23)
CO2: 30 mmol/L (ref 22–32)
Calcium: 9 mg/dL (ref 8.9–10.3)
Chloride: 97 mmol/L — ABNORMAL LOW (ref 98–111)
Creatinine, Ser: 0.59 mg/dL (ref 0.44–1.00)
GFR, Estimated: >60 mL/min (ref >60)
Glucose, Bld: 100 mg/dL — ABNORMAL HIGH (ref 70–99)
Potassium: 4 mmol/L (ref 3.5–5.1)
Sodium: 134 mmol/L — ABNORMAL LOW (ref 135–145)
Total Bilirubin: 0.8 mg/dL (ref 0.3–1.2)
Total Protein: 7.8 g/dL (ref 6.5–8.1)

## 2021-05-26 LAB — SURGICAL PCR SCREEN
MRSA, PCR: NEGATIVE
Staphylococcus aureus: NEGATIVE

## 2021-05-26 LAB — PROTIME-INR
INR: 1 (ref 0.8–1.2)
Prothrombin Time: 12.8 seconds (ref 11.4–15.2)

## 2021-05-26 LAB — CBC
HCT: 40.3 % (ref 36.0–46.0)
Hemoglobin: 13.5 g/dL (ref 12.0–15.0)
MCH: 29.9 pg (ref 26.0–34.0)
MCHC: 33.5 g/dL (ref 30.0–36.0)
MCV: 89.4 fL (ref 80.0–100.0)
Platelets: 346 10*3/uL (ref 150–400)
RBC: 4.51 MIL/uL (ref 3.87–5.11)
RDW: 13.2 % (ref 11.5–15.5)
WBC: 7.3 10*3/uL (ref 4.0–10.5)
nRBC: 0 % (ref 0.0–0.2)

## 2021-05-26 NOTE — Progress Notes (Signed)
COVID test- 06/04/21  PCP - Dr. Gerald Dexter will be 06/15/21 Cardiologist - Dr/ H. Smith  Chest x-ray - 03/11/21-epic EKG - 03/24/21-epic Stress Test - 04/02/21-epic ECHO - 2011 Cardiac Cath - no Pacemaker/ICD device last checked:NMA  Sleep Study - no CPAP -   Fasting Blood Sugar - NA Checks Blood Sugar _____ times a day  Blood Thinner Instructions:NA Aspirin Instructions: Last Dose:  Anesthesia review: yes  Patient denies shortness of breath, fever, cough and chest pain at PAT appointment Pt reports no SOB but she is anxious about surgery and post op. She had lung surgery in Jan and had a-fib after . Metoprolol help resolve it but she is scared.   Patient verbalized understanding of instructions that were given to them at the PAT appointment. Patient was also instructed that they will need to review over the PAT instructions again at home before surgery.yes. Pt's questions were answered and instructions reviewed. Pt came back later to talk more about the surgery. I referred her to The office to ask about post op home care.

## 2021-06-01 ENCOUNTER — Ambulatory Visit (HOSPITAL_BASED_OUTPATIENT_CLINIC_OR_DEPARTMENT_OTHER): Payer: Medicare HMO | Admitting: Obstetrics & Gynecology

## 2021-06-01 DIAGNOSIS — C3492 Malignant neoplasm of unspecified part of left bronchus or lung: Secondary | ICD-10-CM | POA: Diagnosis not present

## 2021-06-01 DIAGNOSIS — E785 Hyperlipidemia, unspecified: Secondary | ICD-10-CM | POA: Diagnosis not present

## 2021-06-01 DIAGNOSIS — M8580 Other specified disorders of bone density and structure, unspecified site: Secondary | ICD-10-CM | POA: Diagnosis not present

## 2021-06-01 DIAGNOSIS — I10 Essential (primary) hypertension: Secondary | ICD-10-CM | POA: Diagnosis not present

## 2021-06-01 DIAGNOSIS — I2584 Coronary atherosclerosis due to calcified coronary lesion: Secondary | ICD-10-CM | POA: Diagnosis not present

## 2021-06-01 DIAGNOSIS — J439 Emphysema, unspecified: Secondary | ICD-10-CM | POA: Diagnosis not present

## 2021-06-01 DIAGNOSIS — Z23 Encounter for immunization: Secondary | ICD-10-CM | POA: Diagnosis not present

## 2021-06-01 DIAGNOSIS — Z0000 Encounter for general adult medical examination without abnormal findings: Secondary | ICD-10-CM | POA: Diagnosis not present

## 2021-06-01 DIAGNOSIS — G4700 Insomnia, unspecified: Secondary | ICD-10-CM | POA: Diagnosis not present

## 2021-06-01 DIAGNOSIS — I700 Atherosclerosis of aorta: Secondary | ICD-10-CM | POA: Diagnosis not present

## 2021-06-01 DIAGNOSIS — R69 Illness, unspecified: Secondary | ICD-10-CM | POA: Diagnosis not present

## 2021-06-04 ENCOUNTER — Other Ambulatory Visit: Payer: Self-pay | Admitting: Orthopedic Surgery

## 2021-06-05 LAB — SARS CORONAVIRUS 2 (TAT 6-24 HRS): SARS Coronavirus 2: NEGATIVE

## 2021-06-07 NOTE — H&P (Signed)
TOTAL KNEE ADMISSION H&P  Patient is being admitted for left total knee arthroplasty.  Subjective:  Chief Complaint: Left knee pain.  HPI: Marissa Jacobs, 85 y.o. female has a history of pain and functional disability in the left knee due to arthritis and has failed non-surgical conservative treatments for greater than 12 weeks to include NSAID's and/or analgesics, flexibility and strengthening excercises, and activity modification. Onset of symptoms was gradual, starting  several  years ago with gradually worsening course since that time. The patient noted no past surgery on the left knee.  Patient currently rates pain in the left knee at 6 out of 10 with activity. Patient has worsening of pain with activity and weight bearing, pain that interferes with activities of daily living, pain with passive range of motion, and crepitus. Patient has evidence of periarticular osteophytes and joint space narrowing by imaging studies. There is no active infection.  Patient Active Problem List   Diagnosis Date Noted   Pulmonary nodules/lesions, multiple 05/05/2020   Bilateral hearing loss 05/29/2019   Osteoarthritis of knee 06/27/2018   Atypical chest pain 05/02/2018   Hypertensive disorder 05/02/2018   GERD with stricture 11/01/2014   Hyperlipidemia 07/28/2012    Past Medical History:  Diagnosis Date   Anemia    secondary to taking omeprazole-on iron   Cancer ([place]) 08/2020   lung   Diverticulosis 09/2002   Dysrhythmia    a-fib after surgery 09/04/20. treated with Metoprolol for 6 week   GERD (gastroesophageal reflux disease)    Hyperlipidemia    Hypertension    Lung nodule    Osteopenia    Post traumatic stress disorder    STD (sexually transmitted disease)    HSV    Past Surgical History:  Procedure Laterality Date   APPENDECTOMY     CERVICAL BIOPSY  W/ LOOP ELECTRODE EXCISION     CIN1   COLPOSCOPY     ESOPHAGEAL DILATION  08/17/2011   FACIAL COSMETIC SURGERY  04/16/2002   KNEE  SURGERY     LUNG REMOVAL, PARTIAL Left 08/2020   no chemo or radiation needed   TONSILLECTOMY      Prior to Admission medications   Medication Sig Start Date End Date Taking? Authorizing Provider  Ascorbic Acid (VITAMIN C) 1000 MG tablet Take 1,000 mg by mouth daily. Vit C Complex   Yes [provider]  b complex vitamins capsule Take 1 capsule by mouth daily.   Yes [provider]  Brimonidine Tartrate (LUMIFY) 0.025 % SOLN Place 1 drop into both eyes daily.   Yes [provider]  CALCIUM-MAGNESIUM-VITAMIN D PO Take 1 tablet by mouth 3 (three) times a week.   Yes [provider]  Cholecalciferol (VITAMIN D3) 125 MCG (5000 UT) CAPS Take 5,000 Units by mouth daily.   Yes [provider]  Coenzyme Q10 (CO Q 10) 100 MG CAPS Take 100 mg by mouth daily.   Yes [provider]  Cyanocobalamin (B-12) 1000 MCG SUBL Place 1,000 mcg under the tongue 2 (two) times a week.   Yes [provider]  EPIPEN 2-PAK 0.3 MG/0.3ML SOAJ injection Inject 0.3 mg into the muscle as needed for anaphylaxis [employer] fast streamer). 11/05/16  Yes [provider]  estradiol (VIVELLE-DOT) 0.025 MG/24HR Place 1 patch onto the skin 2 (two) times a week. 05/29/20  Yes Megan Salon, MD  fexofenadine (ALLEGRA) 180 MG tablet Take 180 mg by mouth daily as needed for allergies.   Yes [provider]  folic acid (FOLVITE) 981 MCG tablet Take 800 mcg by mouth daily.   Yes [provider]  Grape Seed Extract 100 MG CAPS Take 100 mg by mouth daily.   Yes [provider]  hydrochlorothiazide (MICROZIDE) 12.5 MG capsule Take 12.5 mg by mouth daily.   Yes [provider]  losartan (COZAAR) 50 MG tablet Take 1 tablet (50 mg total) by mouth daily. 10/27/20  Yes Belva Crome, MD  Magnesium 300 MG CAPS Take 300 mg by mouth every evening.   Yes [provider]  Multiple Vitamins-Minerals (MULTIVITAMIN WITH MINERALS) tablet Take 1 tablet by  mouth daily.   Yes [provider]  nitroGLYCERIN (NITROSTAT) 0.4 MG SL tablet Place 1 tablet (0.4 mg total) under the tongue every 5 (five) minutes as needed for chest pain. 1/91/47  Yes Delora Fuel, MD  Omega-3 Fatty Acids (FISH OIL) 1200 MG CAPS Take 1,200 mg by mouth daily.   Yes [provider]  omeprazole (PRILOSEC) 10 MG capsule Take 10 mg by mouth daily as needed (acid reflux). 04/04/16  Yes [provider]  progesterone (PROMETRIUM) 100 MG capsule Take 100 mg by mouth See admin instructions. Take daily the 1st - 15th of each month   Yes [provider]  pyridOXINE (VITAMIN B-6) 50 MG tablet Take 50 mg by mouth 2 (two) times a week.   Yes [provider]  Quercetin 500 MG CAPS Take 500 mg by mouth daily.   Yes [provider]  Red Yeast Rice 600 MG CAPS Take 1,200 mg by mouth at bedtime.   Yes [provider]  tretinoin (RETIN-A) 0.025 % cream Apply 1 application topically at bedtime.  02/05/13  Yes [provider]  zinc gluconate 50 MG tablet Take 50 mg by mouth every evening.   Yes [provider]  PROMETRIUM 100 MG capsule Take 1 capsule (100 mg total) by mouth daily. 05/23/19 05/29/20  Megan Salon, MD    Allergies  Allergen Reactions   Abobotulinumtoxina Rash    Something like Botox   Codeine Hives   Other     Pt went into AFIB after having a procedure on 09/05/20   Shellfish Allergy Nausea And Vomiting    Crab Meat - nausea and vomiting    Amoxicillin Itching and Rash    Social History   Socioeconomic History   Marital status: Divorced    Spouse name: Not on file   Number of children: Not on file   Years of education: Not on file   Highest education level: Not on file  Occupational History   Not on file  Tobacco Use   Smoking status: Former    Types: Cigarettes    Quit date: 02/21/1963    Years since quitting: 58.3   Smokeless tobacco: Never   Tobacco comments:    in college  Vaping  Use   Vaping Use: Never used  Substance and Sexual Activity   Alcohol use: Yes    Alcohol/week: 5.0 standard drinks    Types: 5 Standard drinks or equivalent per week   Drug use: No   Sexual activity: Yes    Partners: Male    Birth control/protection: Post-menopausal  Other Topics Concern   Not on file  Social History Narrative   Not on file   Social Determinants of Health   Financial Resource Strain: Not on file  Food Insecurity: Not on file  Transportation Needs: Not on file  Physical Activity: Not on  file  Stress: Not on file  Social Connections: Not on file  Intimate Partner Violence: Not on file    Tobacco Use: Medium Risk   Smoking Tobacco Use: Former   Smokeless Tobacco Use: Never   Passive Exposure: Not on file   Social History   Substance and Sexual Activity  Alcohol Use Yes   Alcohol/week: 5.0 standard drinks   Types: 5 Standard drinks or equivalent per week    Family History  Problem Relation Age of Onset   COPD Mother    Sudden death Father 56   Healthy Brother     ROS: Constitutional: no fever, no chills, no night sweats, no significant weight loss Cardiovascular: no chest pain, no palpitations Respiratory: no cough, no shortness of breath, No COPD Gastrointestinal: no vomiting, no nausea Musculoskeletal: no swelling in Joints, Joint Pain Neurologic: no numbness, no tingling, no difficulty with balance   Objective:  Physical Exam: Well nourished and well developed.  General: Alert and oriented x3, cooperative and pleasant, no acute distress.  Head: normocephalic, atraumatic, neck supple.  Eyes: EOMI.  Respiratory: breath sounds clear in all fields, no wheezing, rales, or rhonchi. Cardiovascular: Regular rate and rhythm, no murmurs, gallops or rubs.  Abdomen: non-tender to palpation and soft, normoactive bowel sounds. Musculoskeletal:   The patient has an antalgic gait pattern favoring the left side without the use of assistive devices.      Left Hip Exam:   The range of motion: normal without discomfort.     Left Knee Exam:   Slight effusion present. No swelling present.   Slight valgus deformity.   The range of motion is: 5 to 125 degrees.   No crepitus on range of motion of the knee.   Positive lateral greater than medial joint line tenderness.   The knee is stable.  Calves soft and nontender. Motor function intact in LE. Strength 5/5 LE bilaterally. Neuro: Distal pulses 2+. Sensation to light touch intact in LE.     Vital signs in last 24 hours:    Imaging Review Radiographs- bilateral AP and lateral of the left knee from today demonstrate bone-on-bone arthritis in the lateral compartment with significant patellofemoral narrowing. She also has moderate osteophyte formation. She has slight valgus deformity. The right knee appears normal on x-ray.  Assessment/Plan:  End stage arthritis, left knee   The patient history, physical examination, clinical judgment of the provider and imaging studies are consistent with end stage degenerative joint disease of the left knee and total knee arthroplasty is deemed medically necessary. The treatment options including medical management, injection therapy arthroscopy and arthroplasty were discussed at length. The risks and benefits of total knee arthroplasty were presented and reviewed. The risks due to aseptic loosening, infection, stiffness, patella tracking problems, thromboembolic complications and other imponderables were discussed. The patient acknowledged the explanation, agreed to proceed with the plan and consent was signed. Patient is being admitted for inpatient treatment for surgery, pain control, PT, OT, prophylactic antibiotics, VTE prophylaxis, progressive ambulation and ADLs and discharge planning. The patient is planning to be discharged  home .   Patient's anticipated LOS is less than 2 midnights, meeting these requirements: - Lives within 1 hour of care - Has  a competent adult at home to recover with post-op - NO history of  - Chronic pain requiring opiods  - Diabetes  - Coronary Artery Disease  - Heart failure  - Heart attack  - Stroke  - DVT/VTE  - Cardiac arrhythmia  -  Respiratory Failure/COPD  - Renal failure  - Anemia  - Advanced Liver disease    Therapy Plans: EO Disposition: Home with Significant Other Planned DVT Prophylaxis: Xarelto 10mg  (Hx of Lung Cancer) DME Needed: None PCP: Daneen Schick (Clearance received) TXA: IV Allergies: Codeine Anesthesia Concerns: None BMI: 27.2 Last HgbA1c: N/A  Pharmacy: [facility]  Other: Portion of Left Upper lobe removed due to cancerous lesion.   - Patient was instructed on what medications to stop prior to surgery. - Follow-up visit in 2 weeks with Dr. Wynelle Link - Begin physical therapy following surgery - Pre-operative lab work as pre-surgical testing - Prescriptions will be provided in hospital at time of discharge  Fenton Foy, [facility], PA-C Orthopedic Surgery [facility] [place]

## 2021-06-08 ENCOUNTER — Observation Stay (HOSPITAL_COMMUNITY)
Admission: RE | Admit: 2021-06-08 | Discharge: 2021-06-10 | Disposition: A | Discharge status: Home / Self Care | Payer: Medicare HMO | Attending: Orthopedic Surgery | Admitting: Orthopedic Surgery

## 2021-06-08 ENCOUNTER — Other Ambulatory Visit: Payer: Self-pay

## 2021-06-08 ENCOUNTER — Ambulatory Visit (HOSPITAL_COMMUNITY): Payer: Medicare HMO | Admitting: Certified Registered"

## 2021-06-08 ENCOUNTER — Encounter (HOSPITAL_COMMUNITY)
Admission: RE | Disposition: A | Discharge status: Home / Self Care | Payer: Self-pay | Source: Home / Self Care | Attending: Orthopedic Surgery

## 2021-06-08 ENCOUNTER — Encounter (HOSPITAL_COMMUNITY): Payer: Self-pay | Admitting: Orthopedic Surgery

## 2021-06-08 DIAGNOSIS — M179 Osteoarthritis of knee, unspecified: Secondary | ICD-10-CM | POA: Diagnosis present

## 2021-06-08 DIAGNOSIS — E785 Hyperlipidemia, unspecified: Secondary | ICD-10-CM | POA: Diagnosis not present

## 2021-06-08 DIAGNOSIS — M1712 Unilateral primary osteoarthritis, left knee: Principal | ICD-10-CM | POA: Insufficient documentation

## 2021-06-08 DIAGNOSIS — M25562 Pain in left knee: Secondary | ICD-10-CM | POA: Diagnosis not present

## 2021-06-08 DIAGNOSIS — Z87891 Personal history of nicotine dependence: Secondary | ICD-10-CM | POA: Diagnosis not present

## 2021-06-08 DIAGNOSIS — R2689 Other abnormalities of gait and mobility: Secondary | ICD-10-CM | POA: Diagnosis not present

## 2021-06-08 DIAGNOSIS — Z85118 Personal history of other malignant neoplasm of bronchus and lung: Secondary | ICD-10-CM | POA: Insufficient documentation

## 2021-06-08 DIAGNOSIS — I10 Essential (primary) hypertension: Secondary | ICD-10-CM | POA: Insufficient documentation

## 2021-06-08 DIAGNOSIS — G8918 Other acute postprocedural pain: Secondary | ICD-10-CM | POA: Diagnosis not present

## 2021-06-08 DIAGNOSIS — Z79899 Other long term (current) drug therapy: Secondary | ICD-10-CM | POA: Insufficient documentation

## 2021-06-08 HISTORY — PX: TOTAL KNEE ARTHROPLASTY: SHX125

## 2021-06-08 LAB — ABO/RH: ABO/RH(D): O POS

## 2021-06-08 LAB — TYPE AND SCREEN
ABO/RH(D): O POS
Antibody Screen: NEGATIVE

## 2021-06-08 SURGERY — ARTHROPLASTY, KNEE, TOTAL
Anesthesia: Monitor Anesthesia Care | Site: Knee | Laterality: Left

## 2021-06-08 MED ORDER — OXYCODONE HCL 5 MG/5ML PO SOLN: 5.0000 mg | Freq: Once | ORAL | Status: DC | PRN

## 2021-06-08 MED ORDER — SODIUM CHLORIDE (PF) 0.9 % IJ SOLN
INTRAMUSCULAR | Status: AC
  Filled 2021-06-08: qty 10

## 2021-06-08 MED ORDER — METOCLOPRAMIDE HCL 5 MG/ML IJ SOLN: 5.0000 mg | Freq: Three times a day (TID) | INTRAMUSCULAR | Status: DC | PRN

## 2021-06-08 MED ORDER — SODIUM CHLORIDE 0.9 % IV SOLN: INTRAVENOUS | Status: DC

## 2021-06-08 MED ORDER — ONDANSETRON HCL 4 MG/2ML IJ SOLN
INTRAMUSCULAR | Status: DC | PRN
  Administered 2021-06-08: 4 mg via INTRAVENOUS

## 2021-06-08 MED ORDER — BUPIVACAINE IN DEXTROSE 0.75-8.25 % IT SOLN
INTRATHECAL | Status: DC | PRN
  Administered 2021-06-08: 1.5 mL via INTRATHECAL

## 2021-06-08 MED ORDER — DOCUSATE SODIUM 100 MG PO CAPS
100.0000 mg | ORAL_CAPSULE | Freq: Two times a day (BID) | ORAL | Status: DC
  Administered 2021-06-08 – 2021-06-10 (×4): 100 mg via ORAL
  Filled 2021-06-08 (×4): qty 1

## 2021-06-08 MED ORDER — METOCLOPRAMIDE HCL 5 MG PO TABS: 5.0000 mg | ORAL_TABLET | Freq: Three times a day (TID) | ORAL | Status: DC | PRN

## 2021-06-08 MED ORDER — PROPOFOL 1000 MG/100ML IV EMUL
INTRAVENOUS | Status: AC
  Filled 2021-06-08: qty 200

## 2021-06-08 MED ORDER — FENTANYL CITRATE PF 50 MCG/ML IJ SOSY: 50.0000 ug | PREFILLED_SYRINGE | INTRAMUSCULAR | Status: DC

## 2021-06-08 MED ORDER — PHENYLEPHRINE HCL-NACL 20-0.9 MG/250ML-% IV SOLN
INTRAVENOUS | Status: DC | PRN
  Administered 2021-06-08: 30 ug/min via INTRAVENOUS

## 2021-06-08 MED ORDER — FENTANYL CITRATE (PF) 100 MCG/2ML IJ SOLN
INTRAMUSCULAR | Status: AC
  Filled 2021-06-08: qty 2

## 2021-06-08 MED ORDER — STERILE WATER FOR IRRIGATION IR SOLN
Status: DC | PRN
  Administered 2021-06-08: 2000 mL

## 2021-06-08 MED ORDER — CHLORHEXIDINE GLUCONATE 0.12 % MT SOLN
15.0000 mL | Freq: Once | OROMUCOSAL | Status: AC
  Administered 2021-06-08: 15 mL via OROMUCOSAL

## 2021-06-08 MED ORDER — MENTHOL 3 MG MT LOZG: 1.0000 | LOZENGE | OROMUCOSAL | Status: DC | PRN

## 2021-06-08 MED ORDER — DEXAMETHASONE SODIUM PHOSPHATE 10 MG/ML IJ SOLN
10.0000 mg | Freq: Once | INTRAMUSCULAR | Status: AC
  Administered 2021-06-09: 10 mg via INTRAVENOUS
  Filled 2021-06-08 (×2): qty 1

## 2021-06-08 MED ORDER — FLEET ENEMA 7-19 GM/118ML RE ENEM: 1.0000 | ENEMA | Freq: Once | RECTAL | Status: DC | PRN

## 2021-06-08 MED ORDER — ONDANSETRON HCL 4 MG/2ML IJ SOLN: 4.0000 mg | Freq: Once | INTRAMUSCULAR | Status: DC | PRN

## 2021-06-08 MED ORDER — ORAL CARE MOUTH RINSE: 15.0000 mL | Freq: Once | OROMUCOSAL | Status: AC

## 2021-06-08 MED ORDER — PROPOFOL 10 MG/ML IV BOLUS
INTRAVENOUS | Status: DC | PRN
  Administered 2021-06-08: 40 mg via INTRAVENOUS
  Administered 2021-06-08 (×2): 20 mg via INTRAVENOUS

## 2021-06-08 MED ORDER — BISACODYL 10 MG RE SUPP: 10.0000 mg | Freq: Every day | RECTAL | Status: DC | PRN

## 2021-06-08 MED ORDER — DEXAMETHASONE SODIUM PHOSPHATE 10 MG/ML IJ SOLN
8.0000 mg | Freq: Once | INTRAMUSCULAR | Status: AC
Start: 2021-06-08 — End: 2021-06-08
  Administered 2021-06-08: 8 mg via INTRAVENOUS

## 2021-06-08 MED ORDER — ONDANSETRON HCL 4 MG/2ML IJ SOLN: 4.0000 mg | Freq: Four times a day (QID) | INTRAMUSCULAR | Status: DC | PRN

## 2021-06-08 MED ORDER — PHENYLEPHRINE HCL (PRESSORS) 10 MG/ML IV SOLN
INTRAVENOUS | Status: AC
  Filled 2021-06-08: qty 2

## 2021-06-08 MED ORDER — GLYCOPYRROLATE PF 0.2 MG/ML IJ SOSY
PREFILLED_SYRINGE | INTRAMUSCULAR | Status: DC | PRN
  Administered 2021-06-08: .2 mg via INTRAVENOUS

## 2021-06-08 MED ORDER — HYDROMORPHONE HCL 2 MG PO TABS
2.0000 mg | ORAL_TABLET | ORAL | Status: DC | PRN
  Administered 2021-06-08 – 2021-06-09 (×3): 2 mg via ORAL
  Administered 2021-06-09 – 2021-06-10 (×6): 4 mg via ORAL
  Administered 2021-06-10: 2 mg via ORAL
  Filled 2021-06-08 (×9): qty 2
  Filled 2021-06-08 (×2): qty 1

## 2021-06-08 MED ORDER — METHOCARBAMOL 500 MG PO TABS
500.0000 mg | ORAL_TABLET | Freq: Four times a day (QID) | ORAL | Status: DC | PRN
  Administered 2021-06-08 – 2021-06-10 (×6): 500 mg via ORAL
  Filled 2021-06-08 (×6): qty 1

## 2021-06-08 MED ORDER — CEFAZOLIN SODIUM-DEXTROSE 2-4 GM/100ML-% IV SOLN
2.0000 g | INTRAVENOUS | Status: AC
  Administered 2021-06-08: 2 g via INTRAVENOUS
  Filled 2021-06-08: qty 100

## 2021-06-08 MED ORDER — ACETAMINOPHEN 10 MG/ML IV SOLN
1000.0000 mg | Freq: Four times a day (QID) | INTRAVENOUS | Status: DC
  Administered 2021-06-08: 1000 mg via INTRAVENOUS
  Filled 2021-06-08: qty 100

## 2021-06-08 MED ORDER — LACTATED RINGERS IV SOLN: INTRAVENOUS | Status: DC

## 2021-06-08 MED ORDER — HYDROCHLOROTHIAZIDE 12.5 MG PO TABS
12.5000 mg | ORAL_TABLET | Freq: Every day | ORAL | Status: DC
  Administered 2021-06-09 – 2021-06-10 (×2): 12.5 mg via ORAL
  Filled 2021-06-08 (×3): qty 1

## 2021-06-08 MED ORDER — ONDANSETRON HCL 4 MG PO TABS: 4.0000 mg | ORAL_TABLET | Freq: Four times a day (QID) | ORAL | Status: DC | PRN

## 2021-06-08 MED ORDER — HYDROMORPHONE HCL 1 MG/ML IJ SOLN
0.5000 mg | INTRAMUSCULAR | Status: DC | PRN
  Administered 2021-06-08 – 2021-06-09 (×3): 1 mg via INTRAVENOUS
  Filled 2021-06-08 (×3): qty 1

## 2021-06-08 MED ORDER — ONDANSETRON HCL 4 MG/2ML IJ SOLN
INTRAMUSCULAR | Status: AC
  Filled 2021-06-08: qty 4

## 2021-06-08 MED ORDER — PROPOFOL 500 MG/50ML IV EMUL
INTRAVENOUS | Status: DC | PRN
  Administered 2021-06-08: 30 ug/kg/min via INTRAVENOUS

## 2021-06-08 MED ORDER — BUPIVACAINE LIPOSOME 1.3 % IJ SUSP: 20.0000 mL | Freq: Once | INTRAMUSCULAR | Status: DC

## 2021-06-08 MED ORDER — POLYETHYLENE GLYCOL 3350 17 G PO PACK: 17.0000 g | PACK | Freq: Every day | ORAL | Status: DC | PRN

## 2021-06-08 MED ORDER — DIPHENHYDRAMINE HCL 12.5 MG/5ML PO ELIX: 12.5000 mg | ORAL_SOLUTION | ORAL | Status: DC | PRN

## 2021-06-08 MED ORDER — MORPHINE SULFATE (PF) 2 MG/ML IV SOLN: 0.5000 mg | INTRAVENOUS | Status: DC | PRN

## 2021-06-08 MED ORDER — ACETAMINOPHEN 500 MG PO TABS
1000.0000 mg | ORAL_TABLET | Freq: Four times a day (QID) | ORAL | Status: AC
  Administered 2021-06-08 – 2021-06-09 (×3): 1000 mg via ORAL
  Filled 2021-06-08 (×4): qty 2

## 2021-06-08 MED ORDER — SODIUM CHLORIDE 0.9 % IR SOLN
Status: DC | PRN
  Administered 2021-06-08: 1000 mL

## 2021-06-08 MED ORDER — FENTANYL CITRATE PF 50 MCG/ML IJ SOSY: 25.0000 ug | PREFILLED_SYRINGE | INTRAMUSCULAR | Status: DC | PRN

## 2021-06-08 MED ORDER — BUPIVACAINE LIPOSOME 1.3 % IJ SUSP
INTRAMUSCULAR | Status: AC
  Filled 2021-06-08: qty 20

## 2021-06-08 MED ORDER — PHENOL 1.4 % MT LIQD: 1.0000 | OROMUCOSAL | Status: DC | PRN

## 2021-06-08 MED ORDER — TRAMADOL HCL 50 MG PO TABS
50.0000 mg | ORAL_TABLET | Freq: Four times a day (QID) | ORAL | Status: DC | PRN
  Administered 2021-06-08 – 2021-06-10 (×2): 100 mg via ORAL
  Filled 2021-06-08 (×4): qty 2

## 2021-06-08 MED ORDER — POVIDONE-IODINE 10 % EX SWAB
2.0000 "application " | Freq: Once | CUTANEOUS | Status: AC
  Administered 2021-06-08: 2 via TOPICAL

## 2021-06-08 MED ORDER — SODIUM CHLORIDE (PF) 0.9 % IJ SOLN
INTRAMUSCULAR | Status: DC | PRN
  Administered 2021-06-08: 60 mL

## 2021-06-08 MED ORDER — BUPIVACAINE LIPOSOME 1.3 % IJ SUSP
INTRAMUSCULAR | Status: DC | PRN
  Administered 2021-06-08: 20 mL

## 2021-06-08 MED ORDER — LOSARTAN POTASSIUM 50 MG PO TABS
50.0000 mg | ORAL_TABLET | Freq: Every day | ORAL | Status: DC
  Administered 2021-06-09 – 2021-06-10 (×2): 50 mg via ORAL
  Filled 2021-06-08 (×2): qty 1

## 2021-06-08 MED ORDER — RIVAROXABAN 10 MG PO TABS
10.0000 mg | ORAL_TABLET | Freq: Every day | ORAL | Status: DC
  Administered 2021-06-09 – 2021-06-10 (×2): 10 mg via ORAL
  Filled 2021-06-08 (×2): qty 1

## 2021-06-08 MED ORDER — FENTANYL CITRATE PF 50 MCG/ML IJ SOSY
PREFILLED_SYRINGE | INTRAMUSCULAR | Status: AC
  Administered 2021-06-08: 50 ug via INTRAVENOUS
  Filled 2021-06-08: qty 1

## 2021-06-08 MED ORDER — CEFAZOLIN SODIUM-DEXTROSE 2-4 GM/100ML-% IV SOLN
2.0000 g | Freq: Four times a day (QID) | INTRAVENOUS | Status: AC
Start: 2021-06-08 — End: 2021-06-08
  Administered 2021-06-08 (×2): 2 g via INTRAVENOUS
  Filled 2021-06-08 (×2): qty 100

## 2021-06-08 MED ORDER — METHOCARBAMOL 500 MG IVPB - SIMPLE MED
500.0000 mg | Freq: Four times a day (QID) | INTRAVENOUS | Status: DC | PRN
  Filled 2021-06-08: qty 50

## 2021-06-08 MED ORDER — TRANEXAMIC ACID-NACL 1000-0.7 MG/100ML-% IV SOLN
1000.0000 mg | INTRAVENOUS | Status: AC
  Administered 2021-06-08: 1000 mg via INTRAVENOUS
  Filled 2021-06-08: qty 100

## 2021-06-08 MED ORDER — PANTOPRAZOLE SODIUM 40 MG PO TBEC
40.0000 mg | DELAYED_RELEASE_TABLET | Freq: Every day | ORAL | Status: DC
  Administered 2021-06-09 – 2021-06-10 (×2): 40 mg via ORAL
  Filled 2021-06-08 (×2): qty 1

## 2021-06-08 MED ORDER — 0.9 % SODIUM CHLORIDE (POUR BTL) OPTIME
TOPICAL | Status: DC | PRN
  Administered 2021-06-08: 1000 mL

## 2021-06-08 MED ORDER — OXYCODONE HCL 5 MG PO TABS: 5.0000 mg | ORAL_TABLET | Freq: Once | ORAL | Status: DC | PRN

## 2021-06-08 SURGICAL SUPPLY — 53 items
BAG COUNTER SPONGE SURGICOUNT (BAG) IMPLANT
BAG SPEC THK2 15X12 ZIP CLS (MISCELLANEOUS) ×1
BAG SPNG CNTER NS LX DISP (BAG)
BAG ZIPLOCK 12X15 (MISCELLANEOUS) ×2 IMPLANT
BLADE SAG 18X100X1.27 (BLADE) ×2 IMPLANT
BLADE SAW SGTL 11.0X1.19X90.0M (BLADE) ×2 IMPLANT
BNDG ELASTIC 6X5.8 VLCR STR LF (GAUZE/BANDAGES/DRESSINGS) ×2 IMPLANT
BOWL SMART MIX CTS (DISPOSABLE) ×2 IMPLANT
CEMENT HV SMART SET (Cement) ×4 IMPLANT
CEMENT TIBIA MBT SIZE 2.5 (Knees) ×1 IMPLANT
CLSR STERI-STRIP ANTIMIC 1/2X4 (GAUZE/BANDAGES/DRESSINGS) ×2 IMPLANT
COVER SURGICAL LIGHT HANDLE (MISCELLANEOUS) ×2 IMPLANT
CUFF TOURN SGL QUICK 34 (TOURNIQUET CUFF) ×2
CUFF TRNQT CYL 34X4.125X (TOURNIQUET CUFF) ×1 IMPLANT
DECANTER SPIKE VIAL GLASS SM (MISCELLANEOUS) ×2 IMPLANT
DRAPE INCISE IOBAN 66X45 STRL (DRAPES) ×2 IMPLANT
DRAPE U-SHAPE 47X51 STRL (DRAPES) ×2 IMPLANT
DRSG AQUACEL AG ADV 3.5X10 (GAUZE/BANDAGES/DRESSINGS) ×2 IMPLANT
DURAPREP 26ML APPLICATOR (WOUND CARE) ×2 IMPLANT
ELECT REM PT RETURN 15FT ADLT (MISCELLANEOUS) ×2 IMPLANT
GLOVE SRG 8 PF TXTR STRL LF DI (GLOVE) ×1 IMPLANT
GLOVE SURG ENC MOIS LTX SZ6.5 (GLOVE) ×2 IMPLANT
GLOVE SURG ENC MOIS LTX SZ8 (GLOVE) ×4 IMPLANT
GLOVE SURG UNDER POLY LF SZ7 (GLOVE) ×2 IMPLANT
GLOVE SURG UNDER POLY LF SZ8 (GLOVE) ×2
GLOVE SURG UNDER POLY LF SZ8.5 (GLOVE) ×2 IMPLANT
GOWN STRL REUS W/TWL LRG LVL3 (GOWN DISPOSABLE) ×4 IMPLANT
GOWN STRL REUS W/TWL XL LVL3 (GOWN DISPOSABLE) ×2 IMPLANT
HANDPIECE INTERPULSE COAX TIP (DISPOSABLE) ×2
HOLDER FOLEY CATH W/STRAP (MISCELLANEOUS) IMPLANT
IMMOBILIZER KNEE 20 (SOFTGOODS) ×2
IMMOBILIZER KNEE 20 THIGH 36 (SOFTGOODS) ×1 IMPLANT
IMPL FEMUR SIGMA LT PS SZ 3 (Knees) ×1 IMPLANT
IMPLANT FEMUR SIGMA LT PS SZ 3 (Knees) ×2 IMPLANT
INSERT TIBIAL PFC SIG SZ3 10MM (Knees) ×2 IMPLANT
MANIFOLD NEPTUNE II (INSTRUMENTS) ×2 IMPLANT
NS IRRIG 1000ML POUR BTL (IV SOLUTION) ×2 IMPLANT
PACK TOTAL KNEE CUSTOM (KITS) ×2 IMPLANT
PADDING CAST COTTON 6X4 STRL (CAST SUPPLIES) ×2 IMPLANT
PATELLA DOME PFC 35MM (Knees) ×2 IMPLANT
PROTECTOR NERVE ULNAR (MISCELLANEOUS) ×2 IMPLANT
SET HNDPC FAN SPRY TIP SCT (DISPOSABLE) ×1 IMPLANT
STRIP CLOSURE SKIN 1/2X4 (GAUZE/BANDAGES/DRESSINGS) ×4 IMPLANT
SUT MNCRL AB 4-0 PS2 18 (SUTURE) ×2 IMPLANT
SUT STRATAFIX 0 PDS 27 VIOLET (SUTURE) ×2
SUT VIC AB 2-0 CT1 27 (SUTURE) ×6
SUT VIC AB 2-0 CT1 TAPERPNT 27 (SUTURE) ×3 IMPLANT
SUTURE STRATFX 0 PDS 27 VIOLET (SUTURE) ×1 IMPLANT
TIBIA MBT CEMENT SIZE 2.5 (Knees) ×2 IMPLANT
TRAY FOLEY MTR SLVR 16FR STAT (SET/KITS/TRAYS/PACK) ×2 IMPLANT
TUBE SUCTION HIGH CAP CLEAR NV (SUCTIONS) ×2 IMPLANT
WATER STERILE IRR 1000ML POUR (IV SOLUTION) ×4 IMPLANT
WRAP KNEE MAXI GEL POST OP (GAUZE/BANDAGES/DRESSINGS) ×2 IMPLANT

## 2021-06-08 NOTE — Anesthesia Procedure Notes (Signed)
Anesthesia Regional Block: Adductor canal block   Pre-Anesthetic Checklist: , timeout performed,  Correct Patient, Correct Site, Correct Laterality,  Correct Procedure, Correct Position, site marked,  Risks and benefits discussed,  Surgical consent,  Pre-op evaluation,  At surgeon's request and post-op pain management  Laterality: Left  Prep: Maximum Sterile Barrier Precautions used, chloraprep       Needles:  Injection technique: Single-shot  Needle Type: Echogenic Stimulator Needle     Needle Length: 9cm  Needle Gauge: 22     Additional Needles:   Procedures:,,,, ultrasound used (permanent image in chart),,    Narrative:  Start time: 06/08/2021 9:00 AM End time: 06/08/2021 9:05 AM Injection made incrementally with aspirations every 5 mL.  Performed by: Personally  Anesthesiologist: Pervis Hocking, DO  Additional Notes: Monitors applied. No increased pain on injection. No increased resistance to injection. Injection made in 5cc increments. Good needle visualization. Patient tolerated procedure well.

## 2021-06-08 NOTE — Progress Notes (Signed)
Orthopedic Tech Progress Note Patient Details:  Marissa Jacobs 1936/08/14 [id number]  CPM Left Knee CPM Left Knee: On Left Knee Flexion (Degrees): 40 Left Knee Extension (Degrees): 10  Post Interventions Patient Tolerated: Well Instructions Provided: Care of device, Adjustment of device  [name] 06/08/2021, 11:24 AM

## 2021-06-08 NOTE — Discharge Instructions (Addendum)
Marissa Arabian, MD Total Joint Specialist [facility] [place] [address], [address] [place], [place] [address] [phone]  TOTAL KNEE REPLACEMENT POSTOPERATIVE DIRECTIONS    Knee Rehabilitation, Guidelines Following Surgery  Results after knee surgery are often greatly improved when you follow the exercise, range of motion and muscle strengthening exercises prescribed by your doctor. Safety measures are also important to protect the knee from further injury. If any of these exercises cause you to have increased pain or swelling in your knee joint, decrease the amount until you are comfortable again and slowly increase them. If you have problems or questions, call your caregiver or physical therapist for advice.   BLOOD CLOT PREVENTION Take a 10 mg Xarelto once a day for three weeks following surgery. Then take an 81 mg Aspirin once a day for three weeks. Then discontinue Aspirin. You may resume your vitamins/supplements once you have discontinued the Xarelto. Do not take any NSAIDs (Advil, Aleve, Ibuprofen, Meloxicam, etc.) until you have discontinued the Xarelto.   HOME CARE INSTRUCTIONS  Remove items at home which could result in a fall. This includes throw rugs or furniture in walking pathways.  ICE to the affected knee as much as tolerated. Icing helps control swelling. If the swelling is well controlled you will be more comfortable and rehab easier. Continue to use ice on the knee for pain and swelling from surgery. You may notice swelling that will progress down to the foot and ankle. This is normal after surgery. Elevate the leg when you are not up walking on it.    Continue to use the breathing machine which will help keep your temperature down. It is common for your temperature to cycle up and down following surgery, especially at night when you are not up moving around and exerting yourself. The breathing machine keeps your lungs expanded and your temperature  down. Do not place pillow under the operative knee, focus on keeping the knee straight while resting  DIET You may resume your previous home diet once you are discharged from the hospital.  DRESSING / WOUND CARE / SHOWERING Keep your bulky bandage on for 2 days. On the third post-operative day you may remove the Ace bandage and gauze. There is a waterproof adhesive bandage on your skin which will stay in place until your first follow-up appointment. Once you remove this you will not need to place another bandage You may begin showering 3 days following surgery, but do not submerge the incision under water.  ACTIVITY For the first 5 days, the key is rest and control of pain and swelling Do your home exercises twice a day starting on post-operative day 3. On the days you go to physical therapy, just do the home exercises once that day. You should rest, ice and elevate the leg for 50 minutes out of every hour. Get up and walk/stretch for 10 minutes per hour. After 5 days you can increase your activity slowly as tolerated. Walk with your walker as instructed. Use the walker until you are comfortable transitioning to a cane. Walk with the cane in the opposite hand of the operative leg. You may discontinue the cane once you are comfortable and walking steadily. Avoid periods of inactivity such as sitting longer than an hour when not asleep. This helps prevent blood clots.  You may discontinue the knee immobilizer once you are able to perform a straight leg raise while lying down. You may resume a sexual relationship in one month or  when given the OK by your doctor.  You may return to work once you are cleared by your doctor.  Do not drive a car for 6 weeks or until released by your surgeon.  Do not drive while taking narcotics.  TED HOSE STOCKINGS Wear the elastic stockings on both legs for three weeks following surgery during the day. You may remove them at night for sleeping.  WEIGHT  BEARING Weight bearing as tolerated with assist device (walker, cane, etc) as directed, use it as long as suggested by your surgeon or therapist, typically at least 4-6 weeks.  POSTOPERATIVE CONSTIPATION PROTOCOL Constipation - defined medically as fewer than three stools per week and severe constipation as less than one stool per week.  One of the most common issues patients have following surgery is constipation.  Even if you have a regular bowel pattern at home, your normal regimen is likely to be disrupted due to multiple reasons following surgery.  Combination of anesthesia, postoperative narcotics, change in appetite and fluid intake all can affect your bowels.  In order to avoid complications following surgery, here are some recommendations in order to help you during your recovery period.  Colace (docusate) - Pick up an over-the-counter form of Colace or another stool softener and take twice a day as long as you are requiring postoperative pain medications.  Take with a full glass of water daily.  If you experience loose stools or diarrhea, hold the colace until you stool forms back up. If your symptoms do not get better within 1 week or if they get worse, check with your doctor. Dulcolax (bisacodyl) - Pick up over-the-counter and take as directed by the product packaging as needed to assist with the movement of your bowels.  Take with a full glass of water.  Use this product as needed if not relieved by Colace only.  MiraLax (polyethylene glycol) - Pick up over-the-counter to have on hand. MiraLax is a solution that will increase the amount of water in your bowels to assist with bowel movements.  Take as directed and can mix with a glass of water, juice, soda, coffee, or tea. Take if you go more than two days without a movement. Do not use MiraLax more than once per day. Call your doctor if you are still constipated or irregular after using this medication for 7 days in a row.  If you continue  to have problems with postoperative constipation, please contact the office for further assistance and recommendations.  If you experience "the worst abdominal pain ever" or develop nausea or vomiting, please contact the office immediatly for further recommendations for treatment.  ITCHING If you experience itching with your medications, try taking only a single pain pill, or even half a pain pill at a time.  You can also use Benadryl over the counter for itching or also to help with sleep.   MEDICATIONS See your medication summary on the "After Visit Summary" that the nursing staff will review with you prior to discharge.  You may have some home medications which will be placed on hold until you complete the course of blood thinner medication.  It is important for you to complete the blood thinner medication as prescribed by your surgeon.  Continue your approved medications as instructed at time of discharge.  PRECAUTIONS If you experience chest pain or shortness of breath - call 911 immediately for transfer to the hospital emergency department.  If you develop a fever greater that 101 F, purulent  drainage from wound, increased redness or drainage from wound, foul odor from the wound/dressing, or calf pain - CONTACT YOUR SURGEON.                                                   FOLLOW-UP APPOINTMENTS Make sure you keep all of your appointments after your operation with your surgeon and caregivers. You should call the office at the above phone number and make an appointment for approximately two weeks after the date of your surgery or on the date instructed by your surgeon outlined in the "After Visit Summary".  RANGE OF MOTION AND STRENGTHENING EXERCISES  Rehabilitation of the knee is important following a knee injury or an operation. After just a few days of immobilization, the muscles of the thigh which control the knee become weakened and shrink (atrophy). Knee exercises are designed to build up  the tone and strength of the thigh muscles and to improve knee motion. Often times heat used for twenty to thirty minutes before working out will loosen up your tissues and help with improving the range of motion but do not use heat for the first two weeks following surgery. These exercises can be done on a training (exercise) mat, on the floor, on a table or on a bed. Use what ever works the best and is most comfortable for you Knee exercises include:  Leg Lifts - While your knee is still immobilized in a splint or cast, you can do straight leg raises. Lift the leg to 60 degrees, hold for 3 sec, and slowly lower the leg. Repeat 10-20 times 2-3 times daily. Perform this exercise against resistance later as your knee gets better.  Quad and Hamstring Sets - Tighten up the muscle on the front of the thigh (Quad) and hold for 5-10 sec. Repeat this 10-20 times hourly. Hamstring sets are done by pushing the foot backward against an object and holding for 5-10 sec. Repeat as with quad sets.  Leg Slides: Lying on your back, slowly slide your foot toward your buttocks, bending your knee up off the floor (only go as far as is comfortable). Then slowly slide your foot back down until your leg is flat on the floor again. Angel Wings: Lying on your back spread your legs to the side as far apart as you can without causing discomfort.  A rehabilitation program following serious knee injuries can speed recovery and prevent re-injury in the future due to weakened muscles. Contact your doctor or a physical therapist for more information on knee rehabilitation.   POST-OPERATIVE OPIOID TAPER INSTRUCTIONS: It is important to wean off of your opioid medication as soon as possible. If you do not need pain medication after your surgery it is ok to stop day one. Opioids include: Codeine, Hydrocodone(Norco, Vicodin), Oxycodone(Percocet, oxycontin) and hydromorphone amongst others.  Long term and even short term use of opiods can  cause: Increased pain response Dependence Constipation Depression Respiratory depression And more.  Withdrawal symptoms can include Flu like symptoms Nausea, vomiting And more Techniques to manage these symptoms Hydrate well Eat regular healthy meals Stay active Use relaxation techniques(deep breathing, meditating, yoga) Do Not substitute Alcohol to help with tapering If you have been on opioids for less than two weeks and do not have pain than it is ok to stop all together.  Plan to  wean off of opioids This plan should start within one week post op of your joint replacement. Maintain the same interval or time between taking each dose and first decrease the dose.  Cut the total daily intake of opioids by one tablet each day Next start to increase the time between doses. The last dose that should be eliminated is the evening dose.   IF YOU ARE TRANSFERRED TO A SKILLED REHAB FACILITY If the patient is transferred to a skilled rehab facility following release from the hospital, a list of the current medications will be sent to the facility for the patient to continue.  When discharged from the skilled rehab facility, please have the facility set up the patient's [place] prior to being released. Also, the skilled facility will be responsible for providing the patient with their medications at time of release from the facility to include their pain medication, the muscle relaxants, and their blood thinner medication. If the patient is still at the rehab facility at time of the two week follow up appointment, the skilled rehab facility will also need to assist the patient in arranging follow up appointment in our office and any transportation needs.  MAKE SURE YOU:  Understand these instructions.  Get help right away if you are not doing well or get worse.   DENTAL ANTIBIOTICS:  In most cases prophylactic antibiotics for Dental procdeures after total joint surgery are  not necessary.  Exceptions are as follows:  1. History of prior total joint infection  2. Severely immunocompromised (Organ Transplant, cancer chemotherapy, Rheumatoid biologic meds such as [place])  3. Poorly controlled diabetes (A1C &gt; 8.0, blood glucose over 200)  If you have one of these conditions, contact your surgeon for an antibiotic prescription, prior to your dental procedure.    Pick up stool softner and laxative for home use following surgery while on pain medications. Do not submerge incision under water. Please use good hand washing techniques while changing dressing each day. May shower starting three days after surgery. Please use a clean towel to pat the incision dry following showers. Continue to use ice for pain and swelling after surgery. Do not use any lotions or creams on the incision until instructed by your surgeon.  Information on my medicine - XARELTO (Rivaroxaban)  Why was Xarelto prescribed for you? Xarelto was prescribed for you to reduce the risk of blood clots forming after orthopedic surgery. The medical term for these abnormal blood clots is venous thromboembolism (VTE).  What do you need to know about xarelto ? Take your Xarelto ONCE DAILY at the same time every day. You may take it either with or without food.  If you have difficulty swallowing the tablet whole, you may crush it and mix in applesauce just prior to taking your dose.  Take Xarelto exactly as prescribed by your doctor and DO NOT stop taking Xarelto without talking to the doctor who prescribed the medication.  Stopping without other VTE prevention medication to take the place of Xarelto may increase your risk of developing a clot.  After discharge, you should have regular check-up appointments with your healthcare provider that is prescribing your Xarelto.    What do you do if you miss a dose? If you miss a dose, take it as soon as you remember on the same day then  continue your regularly scheduled once daily regimen the next day. Do not take two doses of Xarelto on the same day.   Important  Safety Information A possible side effect of Xarelto is bleeding. You should call your healthcare provider right away if you experience any of the following: Bleeding from an injury or your nose that does not stop. Unusual colored urine (red or dark brown) or unusual colored stools (red or black). Unusual bruising for unknown reasons. A serious fall or if you hit your head (even if there is no bleeding).  Some medicines may interact with Xarelto and might increase your risk of bleeding while on Xarelto. To help avoid this, consult your healthcare provider or pharmacist prior to using any new prescription or non-prescription medications, including herbals, vitamins, non-steroidal anti-inflammatory drugs (NSAIDs) and supplements.  This website has more information on Xarelto: [email or web address]

## 2021-06-08 NOTE — Care Plan (Signed)
Ortho Bundle Case Management Note  Patient Details  Name: Marissa Jacobs MRN: [id number] Date of Birth: [birth year]                  L TKA on 06-08-21 DCP: Home with significant other. 2 story home with 1 ste. BR on the 2nd level. DME: No needs. Has a RW and elevated toilets. PT: EO. PT eval scheduled on 06-11-21.   DME Arranged:  N/A DME Agency:     HH Arranged:    HH Agency:     Additional Comments: Please contact me with any questions of if this plan should need to change.  Marianne Sofia, RN,CCM [facility]  [phone] 06/08/2021, 10:19 AM

## 2021-06-08 NOTE — Anesthesia Procedure Notes (Signed)
Spinal  Patient location during procedure: OR Reason for block: surgical anesthesia Staffing Resident/CRNA: Cleda Daub, CRNA Preanesthetic Checklist Completed: patient identified, IV checked, site marked, risks and benefits discussed, surgical consent, monitors and equipment checked, pre-op evaluation and timeout performed Spinal Block Patient position: sitting Prep: DuraPrep Patient monitoring: heart rate, cardiac monitor, continuous pulse ox and blood pressure Approach: midline Location: L3-4 Injection technique: single-shot Needle Needle type: Pencan  Needle gauge: 24 G Needle length: 10 cm Assessment Sensory level: T4 Events: CSF return Additional Notes Checked spinal kit and duraprep expiration dates; full VS and O2 via FM; anesthesiologist in the room; kept sterile technique; anesthetized skin with 1% lido; clear CSF prior to injection, swirl during and after medication placed. Pt tolerated well.

## 2021-06-08 NOTE — Progress Notes (Signed)
Assisted Dr. Beth Finucane with left, ultrasound guided, adductor canal block. Side rails up, monitors on throughout procedure. See vital signs in flow sheet. Tolerated Procedure well. ° °

## 2021-06-08 NOTE — Progress Notes (Signed)
Verified w/ Theresa Duty PA patient's pain medications r/t allergy to codeine. Okay to give PO dilaudid, order to d/c morphine and replace w/ IV dilaudid. Will monitor patient closely for complications.

## 2021-06-08 NOTE — Evaluation (Signed)
Physical Therapy Evaluation Patient Details Name: Marissa Jacobs MRN: [id number] DOB: [birth year] Today's Date: 06/08/2021  History of Present Illness  85 yo female s/p L TKA 06/08/21. Hx of Afib post op, PTSD  Clinical Impression  On eval POD 0, pt required Min A for mobility. She walked a few steps in the room before pivoting to the recliner. Ambulation distance was limited by therapist for safety reasons: pt drowsy from meds and had some difficulty processing. Assisted pt into recliner for supper. Husband present during session. Encouraged pt to sit as tolerated but no longer than 2 hours. Will follow and progress activity as tolerated.        Recommendations for follow up therapy are one component of a multi-disciplinary discharge planning process, led by the attending physician.  Recommendations may be updated based on patient status, additional functional criteria and insurance authorization.  Follow Up Recommendations Follow physician's recommendations for discharge plan and follow up therapies    Assistance Recommended at Discharge    Functional Status Assessment Patient has had a recent decline in their functional status and demonstrates the ability to make significant improvements in function in a reasonable and predictable amount of time.  Equipment Recommendations  Rolling walker (2 wheels) (youth height)    Recommendations for Other Services       Precautions / Restrictions Precautions Precautions: Fall;Knee Required Braces or Orthoses: Knee Immobilizer - Left Knee Immobilizer - Left: Discontinue once straight leg raise with < 10 degree lag Restrictions Weight Bearing Restrictions: No Other Position/Activity Restrictions: WBAT      Mobility  Bed Mobility Overal bed mobility: Needs Assistance Bed Mobility: Supine to Sit     Supine to sit: Min assist;HOB elevated     General bed mobility comments: Assist for L LE. Cues for safety, technique. Increased time. Pt  reported dizziness that resolved enough to attempt standing    Transfers Overall transfer level: Needs assistance Equipment used: Rolling walker (2 wheels) Transfers: Sit to/from [employer] Transfers Sit to Stand: Min assist;From elevated surface Stand pivot transfers: Min assist         General transfer comment: Cues for safety, technique, hand/LE placement. Assist to rise, steady, control descent.    Ambulation/Gait Ambulation/Gait assistance: Min assist Gait Distance (Feet): 3 Feet Assistive device: Rolling walker (2 wheels) Gait Pattern/deviations: Step-to pattern     General Gait Details: Mod Cues for safety, technique, sequence. Limited ambulation distance for safety reasons: pt drowsy from meds and had some difficulty processing  Stairs            Wheelchair Mobility    Modified Rankin (Stroke Patients Only)       Balance Overall balance assessment: Needs assistance         Standing balance support: Bilateral upper extremity supported Standing balance-Leahy Scale: Poor                               Pertinent Vitals/Pain Pain Assessment: 0-10 Pain Score: 3  Breathing: normal Negative Vocalization: none Facial Expression: smiling or inexpressive Body Language: relaxed Consolability: no need to console PAINAD Score: 0 Pain Descriptors / Indicators: Discomfort;Sore;Aching Pain Intervention(s): Limited activity within patient's tolerance;Monitored during session    Home Living Family/patient expects to be discharged to:: Private residence Living Arrangements: Spouse/significant other Available Help at Discharge: Family Type of Home: House Home Access: Stairs to enter   [employer] of Steps: 1 Alternate Level Stairs-Number of Steps:  1 flight Home Layout: Two level Home Equipment: Rollator (4 wheels)      Prior Function Prior Level of Function : Independent/Modified Independent                      Hand Dominance        Extremity/Trunk Assessment   Upper Extremity Assessment Upper Extremity Assessment: Overall WFL for tasks assessed    Lower Extremity Assessment Lower Extremity Assessment: Generalized weakness (SLR 2/5. Knee ext ~15 degrees from 0)    Cervical / Trunk Assessment Cervical / Trunk Assessment: Normal  Communication   Communication: HOH  Cognition Arousal/Alertness: Awake/alert Behavior During Therapy: WFL for tasks assessed/performed Overall Cognitive Status: Within Functional Limits for tasks assessed                                          General Comments      Exercises     Assessment/Plan    PT Assessment Patient needs continued PT services  PT Problem List Decreased strength;Decreased mobility;Decreased range of motion;Decreased activity tolerance;Decreased balance;Decreased knowledge of use of DME;Pain       PT Treatment Interventions DME instruction;Gait training;Therapeutic activities;Therapeutic exercise;Patient/family education;Stair training;Functional mobility training;Balance training    PT Goals (Current goals can be found in the Care Plan section)  Acute Rehab PT Goals Patient Stated Goal: regain PLOF/independence PT Goal Formulation: With patient/family Time For Goal Achievement: 06/22/21 Potential to Achieve Goals: Good    Frequency 7X/week   Barriers to discharge        Co-evaluation               AM-PAC PT "6 Clicks" Mobility  Outcome Measure Help needed turning from your back to your side while in a flat bed without using bedrails?: A Little Help needed moving from lying on your back to sitting on the side of a flat bed without using bedrails?: A Little Help needed moving to and from a bed to a chair (including a wheelchair)?: A Little Help needed standing up from a chair using your arms (e.g., wheelchair or bedside chair)?: A Little Help needed to walk in hospital room?: A Little Help  needed climbing 3-5 steps with a railing? : A Little 6 Click Score: 18    End of Session Equipment Utilized During Treatment: Gait belt;Left knee immobilizer Activity Tolerance: Patient tolerated treatment well Patient left: in chair;with call bell/phone within reach;with family/visitor present   PT Visit Diagnosis: Pain;Other abnormalities of gait and mobility (R26.89) Pain - Right/Left: Left Pain - part of body: Knee    Time: 3474-2595 PT Time Calculation (min) (ACUTE ONLY): 21 min   Charges:   PT Evaluation $PT Eval Low Complexity: [place], PT Acute Rehabilitation  Office: [phone] Pager: [phone]

## 2021-06-08 NOTE — Anesthesia Postprocedure Evaluation (Signed)
Anesthesia Post Note  Patient: Marissa Jacobs  Procedure(s) Performed: TOTAL KNEE ARTHROPLASTY (Left: Knee)     Patient location during evaluation: PACU Anesthesia Type: Regional, MAC and Spinal Level of consciousness: awake and alert and oriented Pain management: pain level controlled Vital Signs Assessment: post-procedure vital signs reviewed and stable Respiratory status: spontaneous breathing, nonlabored ventilation and respiratory function stable Cardiovascular status: blood pressure returned to baseline and stable Postop Assessment: no headache, no backache, spinal receding and patient able to bend at knees Anesthetic complications: no   No notable events documented.  Last Vitals:  Vitals:   06/08/21 1300 06/08/21 1315  BP: (!) 148/57 (!) 160/64  Pulse: 68 (!) 58  Resp: 14 11  Temp:  (!) 35.9 C  SpO2: 100% 100%    Last Pain:  Vitals:   06/08/21 1315  TempSrc:   PainSc: 0-No pain                 Pervis Hocking

## 2021-06-08 NOTE — Transfer of Care (Signed)
Immediate Anesthesia Transfer of Care Note  Patient: Marissa Jacobs  Procedure(s) Performed: TOTAL KNEE ARTHROPLASTY (Left: Knee)  Patient Location: PACU  Anesthesia Type:Spinal  Level of Consciousness: drowsy and patient cooperative  Airway & Oxygen Therapy: Patient Spontanous Breathing and Patient connected to face mask oxygen  Post-op Assessment: Report given to RN and Post -op Vital signs reviewed and stable  Post vital signs: Reviewed and stable  Last Vitals:  Vitals Value Taken Time  BP 119/63 06/08/21 1111  Temp    Pulse 68 06/08/21 1114  Resp 13 06/08/21 1114  SpO2 100 % 06/08/21 1114  Vitals shown include unvalidated device data.  Last Pain:  Vitals:   06/08/21 0912  TempSrc:   PainSc: 0-No pain      Patients Stated Pain Goal: 4 (60/04/59 9774)  Complications: No notable events documented.

## 2021-06-08 NOTE — Op Note (Signed)
OPERATIVE REPORT-TOTAL KNEE ARTHROPLASTY   Pre-operative diagnosis- Osteoarthritis  Left knee(s)  Post-operative diagnosis- Osteoarthritis Left knee(s)  Procedure-  Left  Total Knee Arthroplasty  Surgeon- Dione Plover. Dann Galicia, MD  Assistant- Molli Barrows, PA-C   Anesthesia-   Adductor canal block and spinal  EBL-50 mL   Drains None  Tourniquet time-  Total Tourniquet Time Documented: Thigh (Left) - 37 minutes Total: Thigh (Left) - 37 minutes     Complications- None  Condition-PACU - hemodynamically stable.   Brief Clinical Note  Marissa Jacobs is a 85 y.o. year old female with end stage OA of her left knee with progressively worsening pain and dysfunction. She has constant pain, with activity and at rest and significant functional deficits with difficulties even with ADLs. She has had extensive non-op management including analgesics, injections of cortisone and viscosupplements, and home exercise program, but remains in significant pain with significant dysfunction. Radiographs show bone on bone arthritis lateral and patellofemoral. She presents now for left Total Knee Arthroplasty.     Procedure in detail---   The patient is brought into the operating room and positioned supine on the operating table. After successful administration of  Adductor canal block and spinal,   a tourniquet is placed high on the  Left thigh(s) and the lower extremity is prepped and draped in the usual sterile fashion. Time out is performed by the operating team and then the  Left lower extremity is wrapped in Esmarch, knee flexed and the tourniquet inflated to 300 mmHg.       A midline incision is made with a ten blade through the subcutaneous tissue to the level of the extensor mechanism. A fresh blade is used to make a medial parapatellar arthrotomy. Soft tissue over the proximal medial tibia is subperiosteally elevated to the joint line with a knife and into the semimembranosus bursa with a Cobb  elevator. Soft tissue over the proximal lateral tibia is elevated with attention being paid to avoiding the patellar tendon on the tibial tubercle. The patella is everted, knee flexed 90 degrees and the ACL and PCL are removed. Findings are bone on bone lateral and patellofemoral with large global osteophytes        The drill is used to create a starting hole in the distal femur and the canal is thoroughly irrigated with sterile saline to remove the fatty contents. The 5 degree Left  valgus alignment guide is placed into the femoral canal and the distal femoral cutting block is pinned to remove 10 mm off the distal femur. Resection is made with an oscillating saw.      The tibia is subluxed forward and the menisci are removed. The extramedullary alignment guide is placed referencing proximally at the medial aspect of the tibial tubercle and distally along the second metatarsal axis and tibial crest. The block is pinned to remove 53mm off the more deficient lateral  side. Resection is made with an oscillating saw. Size 2.5is the most appropriate size for the tibia and the proximal tibia is prepared with the modular drill and keel punch for that size.      The femoral sizing guide is placed and size 3 is most appropriate. Rotation is marked off the epicondylar axis and confirmed by creating a rectangular flexion gap at 90 degrees. The size 3 cutting block is pinned in this rotation and the anterior, posterior and chamfer cuts are made with the oscillating saw. The intercondylar block is then placed and that cut is  made.      Trial size 2.5 tibial component, trial size 3 posterior stabilized femur and a 10  mm posterior stabilized rotating platform insert trial is placed. Full extension is achieved with excellent varus/valgus and anterior/posterior balance throughout full range of motion. The patella is everted and thickness measured to be 22  mm. Free hand resection is taken to 12 mm, a 35 template is placed, lug  holes are drilled, trial patella is placed, and it tracks normally. Osteophytes are removed off the posterior femur with the trial in place. All trials are removed and the cut bone surfaces prepared with pulsatile lavage. Cement is mixed and once ready for implantation, the size 2.5 tibial implant, size  3 posterior stabilized femoral component, and the size 35 patella are cemented in place and the patella is held with the clamp. The trial insert is placed and the knee held in full extension. The Exparel (20 ml mixed with 60 ml saline) is injected into the extensor mechanism, posterior capsule, medial and lateral gutters and subcutaneous tissues.  All extruded cement is removed and once the cement is hard the permanent 10 mm posterior stabilized rotating platform insert is placed into the tibial tray.      The wound is copiously irrigated with saline solution and the extensor mechanism closed with # 0 Stratofix suture. The tourniquet is released for a total tourniquet time of 37  minutes. Flexion against gravity is 140 degrees and the patella tracks normally. Subcutaneous tissue is closed with 2.0 vicryl and subcuticular with running 4.0 Monocryl. The incision is cleaned and dried and steri-strips and a bulky sterile dressing are applied. The limb is placed into a knee immobilizer and the patient is awakened and transported to recovery in stable condition.      Please note that a surgical assistant was a medical necessity for this procedure in order to perform it in a safe and expeditious manner. Surgical assistant was necessary to retract the ligaments and vital neurovascular structures to prevent injury to them and also necessary for proper positioning of the limb to allow for anatomic placement of the prosthesis.   Dione Plover Jermany Sundell, MD    06/08/2021, 10:54 AM

## 2021-06-08 NOTE — TOC Transition Note (Signed)
Transition of Care [facility]) - CM/SW Discharge Note   Patient Details  Name: Marissa Jacobs MRN: [id number] Date of Birth: February 07, 1936  Transition of Care [facility] CM/SW Contact:  Lennart Pall, LCSW Phone Number: 06/08/2021, 2:37 PM   Clinical Narrative:    Met with pt and spouse and confirming she has all needed DME at home.  Plan for OPPT at [facility].  No further TOC needs.   Final next level of care: OP Rehab Barriers to Discharge: No Barriers Identified   Patient Goals and CMS Choice Patient states their goals for this hospitalization and ongoing recovery are:: return home      Discharge Placement                       Discharge Plan and Services                DME Arranged: N/A                    Social Determinants of Health (SDOH) Interventions     Readmission Risk Interventions No flowsheet data found.

## 2021-06-08 NOTE — Anesthesia Preprocedure Evaluation (Addendum)
Anesthesia Evaluation  Patient identified by MRN, date of birth, ID band Patient awake    Reviewed: Allergy & Precautions, NPO status , Patient's Chart, lab work & pertinent test results  Airway Mallampati: I  TM Distance: >3 FB Neck ROM: Full    Dental no notable dental hx. (+) Teeth Intact, Dental Advisory Given   Pulmonary former smoker,  Quit smoking 1964 Lung ca s/p lobectomy Jan 2022   Pulmonary exam normal breath sounds clear to auscultation       Cardiovascular hypertension (154/56), Pt. on medications Normal cardiovascular exam+ dysrhythmias (post-op Jan 2022, treated w/ metoprolol x 6wks) Atrial Fibrillation  Rhythm:Regular Rate:Normal  03/2021 normal stress test    Neuro/Psych PSYCHIATRIC DISORDERS Anxiety  Neuromuscular disease (B/L LE numbness)    GI/Hepatic Neg liver ROS, GERD  Medicated and Controlled,  Endo/Other  negative endocrine ROS  Renal/GU negative Renal ROS  negative genitourinary   Musculoskeletal  (+) Arthritis , Osteoarthritis,    Abdominal   Peds  Hematology negative hematology ROS (+) hct 40.3, plt 346   Anesthesia Other Findings   Reproductive/Obstetrics negative OB ROS                            Anesthesia Physical Anesthesia Plan  ASA: 2  Anesthesia Plan: Spinal, Regional and MAC   Post-op Pain Management:    Induction:   PONV Risk Score and Plan: 2 and Propofol infusion and TIVA  Airway Management Planned: Natural Airway and Nasal Cannula  Additional Equipment: None  Intra-op Plan:   Post-operative Plan:   Informed Consent: I have reviewed the patients History and Physical, chart, labs and discussed the procedure including the risks, benefits and alternatives for the proposed anesthesia with the patient or authorized representative who has indicated his/her understanding and acceptance.       Plan Discussed with: CRNA  Anesthesia Plan  Comments: (Pt requesting no versed- bc her friend had issues w/ memory afterwards )       Anesthesia Quick Evaluation

## 2021-06-08 NOTE — Interval H&P Note (Signed)
History and Physical Interval Note:  06/08/2021 7:00 AM  Marissa Jacobs  has presented today for surgery, with the diagnosis of Left knee osteoarthritis.  The various methods of treatment have been discussed with the patient and family. After consideration of risks, benefits and other options for treatment, the patient has consented to  Procedure(s): TOTAL KNEE ARTHROPLASTY (Left) as a surgical intervention.  The patient's history has been reviewed, patient examined, no change in status, stable for surgery.  I have reviewed the patient's chart and labs.  Questions were answered to the patient's satisfaction.     Pilar Plate Ryane Canavan

## 2021-06-09 ENCOUNTER — Encounter (HOSPITAL_COMMUNITY): Payer: Self-pay | Admitting: Orthopedic Surgery

## 2021-06-09 DIAGNOSIS — M25562 Pain in left knee: Secondary | ICD-10-CM | POA: Diagnosis not present

## 2021-06-09 DIAGNOSIS — Z79899 Other long term (current) drug therapy: Secondary | ICD-10-CM | POA: Diagnosis not present

## 2021-06-09 DIAGNOSIS — I10 Essential (primary) hypertension: Secondary | ICD-10-CM | POA: Diagnosis not present

## 2021-06-09 DIAGNOSIS — R2689 Other abnormalities of gait and mobility: Secondary | ICD-10-CM | POA: Diagnosis not present

## 2021-06-09 DIAGNOSIS — Z87891 Personal history of nicotine dependence: Secondary | ICD-10-CM | POA: Diagnosis not present

## 2021-06-09 DIAGNOSIS — Z85118 Personal history of other malignant neoplasm of bronchus and lung: Secondary | ICD-10-CM | POA: Diagnosis not present

## 2021-06-09 DIAGNOSIS — M1712 Unilateral primary osteoarthritis, left knee: Secondary | ICD-10-CM | POA: Diagnosis not present

## 2021-06-09 LAB — BASIC METABOLIC PANEL
Anion gap: 6 (ref 5–15)
BUN: 13 mg/dL (ref 8–23)
CO2: 26 mmol/L (ref 22–32)
Calcium: 8.5 mg/dL — ABNORMAL LOW (ref 8.9–10.3)
Chloride: 98 mmol/L (ref 98–111)
Creatinine, Ser: 0.68 mg/dL (ref 0.44–1.00)
GFR, Estimated: >60 mL/min (ref >60)
Glucose, Bld: 124 mg/dL — ABNORMAL HIGH (ref 70–99)
Potassium: 3.9 mmol/L (ref 3.5–5.1)
Sodium: 130 mmol/L — ABNORMAL LOW (ref 135–145)

## 2021-06-09 LAB — CBC
HCT: 33 % — ABNORMAL LOW (ref 36.0–46.0)
Hemoglobin: 10.9 g/dL — ABNORMAL LOW (ref 12.0–15.0)
MCH: 29.9 pg (ref 26.0–34.0)
MCHC: 33 g/dL (ref 30.0–36.0)
MCV: 90.4 fL (ref 80.0–100.0)
Platelets: 271 10*3/uL (ref 150–400)
RBC: 3.65 MIL/uL — ABNORMAL LOW (ref 3.87–5.11)
RDW: 13.2 % (ref 11.5–15.5)
WBC: 14.2 10*3/uL — ABNORMAL HIGH (ref 4.0–10.5)
nRBC: 0 % (ref 0.0–0.2)

## 2021-06-09 MED ORDER — ACETAMINOPHEN 500 MG PO TABS
1000.0000 mg | ORAL_TABLET | Freq: Four times a day (QID) | ORAL | Status: DC | PRN
  Administered 2021-06-09: 1000 mg via ORAL
  Filled 2021-06-09: qty 2

## 2021-06-09 NOTE — Progress Notes (Addendum)
Physical Therapy Treatment Patient Details Name: Marissa Jacobs MRN: [id number] DOB: 1936-08-13 Today's Date: 06/09/2021   History of Present Illness 85 yo female s/p L TKA 06/08/21. Hx of Afib post op, PTSD    PT Comments    Progressing well. Pain controlled. Should be ready to d/c home on tomorrow.  Pt requested to be put back in CPM this evening-made ortho tech aware.   Recommendations for follow up therapy are one component of a multi-disciplinary discharge planning process, led by the attending physician.  Recommendations may be updated based on patient status, additional functional criteria and insurance authorization.  Follow Up Recommendations  Follow physician's recommendations for discharge plan and follow up therapies     Assistance Recommended at Discharge Intermittent Supervision/Assistance  Equipment Recommendations  Rolling walker (2 wheels) (youth height)    Recommendations for Other Services       Precautions / Restrictions Precautions Precautions: Fall;Knee Required Braces or Orthoses: Knee Immobilizer - Left Knee Immobilizer - Left: Discontinue once straight leg raise with < 10 degree lag Restrictions Weight Bearing Restrictions: No Other Position/Activity Restrictions: WBAT     Mobility  Bed Mobility Overal bed mobility: Needs Assistance Bed Mobility: Sit to Supine       Sit to supine: Supervision   General bed mobility comments: Supv for safety.    Transfers Overall transfer level: Needs assistance Equipment used: Rolling walker (2 wheels) Transfers: Sit to/from Stand Sit to Stand: Min guard           General transfer comment: Cues for safety, technique, hand/LE placement. Min guard for safety    Ambulation/Gait Ambulation/Gait assistance: Min guard Gait Distance (Feet): 165 Feet Assistive device: Rolling walker (2 wheels) Gait Pattern/deviations: Step-to pattern;Step-through pattern     General Gait Details: Cues for safety, RW  proximity, step lengths, posture. Slow but steady gait.   Stairs             Wheelchair Mobility    Modified Rankin (Stroke Patients Only)       Balance Overall balance assessment: Needs assistance         Standing balance support: Bilateral upper extremity supported Standing balance-Leahy Scale: Fair                              Cognition Arousal/Alertness: Awake/alert Behavior During Therapy: WFL for tasks assessed/performed Overall Cognitive Status: Within Functional Limits for tasks assessed                                          Exercises      General Comments        Pertinent Vitals/Pain Pain Assessment: 0-10 Pain Score: 7  Pain Descriptors / Indicators: Discomfort;Sore;Aching Pain Intervention(s): Limited activity within patient's tolerance;Monitored during session;Ice applied;Repositioned    Home Living                          Prior Function            PT Goals (current goals can now be found in the care plan section) Progress towards PT goals: Progressing toward goals    Frequency    7X/week      PT Plan Current plan remains appropriate    Co-evaluation  AM-PAC PT "6 Clicks" Mobility   Outcome Measure  Help needed turning from your back to your side while in a flat bed without using bedrails?: A Little Help needed moving from lying on your back to sitting on the side of a flat bed without using bedrails?: A Little Help needed moving to and from a bed to a chair (including a wheelchair)?: A Little Help needed standing up from a chair using your arms (e.g., wheelchair or bedside chair)?: A Little Help needed to walk in hospital room?: A Little Help needed climbing 3-5 steps with a railing? : A Little 6 Click Score: 18    End of Session Equipment Utilized During Treatment: Gait belt Activity Tolerance: Patient tolerated treatment well Patient left: in bed;with call  bell/phone within reach;with bed alarm set   PT Visit Diagnosis: Pain;Other abnormalities of gait and mobility (R26.89) Pain - Right/Left: Left Pain - part of body: Knee     Time: 2919-1660 PT Time Calculation (min) (ACUTE ONLY): 43 min  Charges:  $Gait Training: 38-52 mins                         Doreatha Massed, PT Acute Rehabilitation  Office: [phone] Pager: [phone]

## 2021-06-09 NOTE — Progress Notes (Signed)
Orthopedic Tech Progress Note Patient Details:  Marissa Jacobs [birth year] [id number]  CPM Left Knee CPM Left Knee: On Left Knee Flexion (Degrees): 50 Left Knee Extension (Degrees): 10  Post Interventions Patient Tolerated: Well Instructions Provided: Care of device, Adjustment of device Put Pt in CPM @4pm . Vernona Rieger 06/09/2021, 4:48 PM

## 2021-06-09 NOTE — Progress Notes (Signed)
   Subjective: 1 Day Post-Op Procedure(s) (LRB): TOTAL KNEE ARTHROPLASTY (Left) Patient reports pain as mild.   Patient seen in rounds by Dr. Wynelle Link. Patient is well, and has had no acute complaints or problems other than pain in the left knee. No issues overnight, tolerating dilaudid without difficulty. Denies chest pain or SOB. Foley catheter removed this AM. We will continue therapy today.   Objective: Vital signs in last 24 hours: Temp:  [96.7 F (35.9 C)-97.9 F (36.6 C)] 97.7 F (36.5 C) (10/25 0519) Pulse Rate:  [58-84] 75 (10/25 0519) Resp:  [9-18] 18 (10/25 0519) BP: (92-163)/(52-90) 143/65 (10/25 0519) SpO2:  [94 %-100 %] 96 % (10/25 0519)  Intake/Output from previous day:  Intake/Output Summary (Last 24 hours) at 06/09/2021 0756 Last data filed at 06/09/2021 0600 Gross per 24 hour  Intake 4849.44 ml  Output 1200 ml  Net 3649.44 ml     Intake/Output this shift: No intake/output data recorded.  Labs: Recent Labs    06/09/21 0326  HGB 10.9*   Recent Labs    06/09/21 0326  WBC 14.2*  RBC 3.65*  HCT 33.0*  PLT 271   Recent Labs    06/09/21 0326  NA 130*  K 3.9  CL 98  CO2 26  BUN 13  CREATININE 0.68  GLUCOSE 124*  CALCIUM 8.5*   No results for input(s): LABPT, INR in the last 72 hours.  Exam: General - Patient is Alert and Oriented Extremity - Neurologically intact Neurovascular intact Sensation intact distally Dorsiflexion/Plantar flexion intact Dressing - dressing C/D/I Motor Function - intact, moving foot and toes well on exam.   Past Medical History:  Diagnosis Date   Anemia    secondary to taking omeprazole-on iron   Cancer ([place]) 08/2020   lung   Diverticulosis 09/2002   Dysrhythmia    a-fib after surgery 09/04/20. treated with Metoprolol for 6 week   GERD (gastroesophageal reflux disease)    Hyperlipidemia    Hypertension    Lung nodule    Osteopenia    Post traumatic stress disorder    STD (sexually transmitted disease)     HSV    Assessment/Plan: 1 Day Post-Op Procedure(s) (LRB): TOTAL KNEE ARTHROPLASTY (Left) Principal Problem:   Osteoarthritis of knee Active Problems:   Primary osteoarthritis of left knee  Estimated body mass index is 26.45 kg/m as calculated from the following:   Height as of this encounter: 5\' 1"  (1.549 m).   Weight as of this encounter: 63.5 kg. Advance diet Up with therapy D/C IV fluids  Anticipated LOS equal to or greater than 2 midnights due to - Age 83 and older with one or more of the following:  - Obesity  - Expected need for hospital services (PT, OT, Nursing) required for safe  discharge  - Anticipated need for postoperative skilled nursing care or inpatient rehab  - Active co-morbidities: Cardiac Arrhythmia OR   - Unanticipated findings during/Post Surgery: None  - Patient is a high risk of re-admission due to: None   DVT Prophylaxis - Xarelto Weight bearing as tolerated. Continue therapy.  Plan is to go Home after hospital stay. Given patients age and medical history (atrial fibrillation), will plan for discharge tomorrow pending progression with therapy.  Theresa Duty, PA-C Orthopedic Surgery [phone] 06/09/2021, 7:56 AM

## 2021-06-09 NOTE — Progress Notes (Signed)
Physical Therapy Treatment Patient Details Name: Marissa Jacobs MRN: [id number] DOB: 03/01/1936 Today's Date: 06/09/2021   History of Present Illness 85 yo female s/p L TKA 06/08/21. Hx of Afib post op, PTSD    PT Comments    Progressing with mobility. Plan is for d/c home tomorrow if meeting PT goals   Recommendations for follow up therapy are one component of a multi-disciplinary discharge planning process, led by the attending physician.  Recommendations may be updated based on patient status, additional functional criteria and insurance authorization.  Follow Up Recommendations  Follow physician's recommendations for discharge plan and follow up therapies     Assistance Recommended at Discharge    Equipment Recommendations  Rolling walker (2 wheels) (youth height)    Recommendations for Other Services       Precautions / Restrictions Precautions Precautions: Fall;Knee Required Braces or Orthoses: Knee Immobilizer - Left Knee Immobilizer - Left: Discontinue once straight leg raise with < 10 degree lag Restrictions Weight Bearing Restrictions: No Other Position/Activity Restrictions: WBAT     Mobility  Bed Mobility Overal bed mobility: Needs Assistance Bed Mobility: Supine to Sit     Supine to sit: HOB elevated;Supervision     General bed mobility comments: Supv for safety.    Transfers Overall transfer level: Needs assistance Equipment used: Rolling walker (2 wheels) Transfers: Sit to/from Stand Sit to Stand: Min assist           General transfer comment: Cues for safety, technique, hand/LE placement. Assist to rise, steady, control descent.    Ambulation/Gait Ambulation/Gait assistance: Min guard Gait Distance (Feet): 95 Feet Assistive device: Rolling walker (2 wheels) Gait Pattern/deviations: Step-to pattern     General Gait Details: Cues for safety, step length, sequence, posture, technique. Followed with recliner but did not need to use  it.   Stairs             Wheelchair Mobility    Modified Rankin (Stroke Patients Only)       Balance Overall balance assessment: Needs assistance         Standing balance support: Bilateral upper extremity supported Standing balance-Leahy Scale: Fair                              Cognition Arousal/Alertness: Awake/alert Behavior During Therapy: WFL for tasks assessed/performed Overall Cognitive Status: Within Functional Limits for tasks assessed                                          Exercises Total Joint Exercises Ankle Circles/Pumps: AROM;Both;10 reps Quad Sets: AROM;Both;10 reps Hip ABduction/ADduction: AROM;Right;10 reps Straight Leg Raises: AROM;Right;10 reps Knee Flexion: AROM;Right;10 reps Goniometric ROM: ~15-65 degrees    General Comments        Pertinent Vitals/Pain Pain Assessment: 0-10 Pain Score: 7  Pain Descriptors / Indicators: Discomfort;Sore;Aching Pain Intervention(s): Ice applied;Repositioned;Monitored during session    Home Living                          Prior Function            PT Goals (current goals can now be found in the care plan section) Progress towards PT goals: Progressing toward goals    Frequency    7X/week      PT Plan Current plan remains  appropriate    Co-evaluation              AM-PAC PT "6 Clicks" Mobility   Outcome Measure  Help needed turning from your back to your side while in a flat bed without using bedrails?: A Little Help needed moving from lying on your back to sitting on the side of a flat bed without using bedrails?: A Little Help needed moving to and from a bed to a chair (including a wheelchair)?: A Little Help needed standing up from a chair using your arms (e.g., wheelchair or bedside chair)?: A Little Help needed to walk in hospital room?: A Little Help needed climbing 3-5 steps with a railing? : A Little 6 Click Score: 18    End  of Session Equipment Utilized During Treatment: Gait belt;Left knee immobilizer Activity Tolerance: Patient tolerated treatment well Patient left: in chair;with call bell/phone within reach;with family/visitor present;with chair alarm set   PT Visit Diagnosis: Pain;Other abnormalities of gait and mobility (R26.89) Pain - Right/Left: Left Pain - part of body: Knee     Time: 0263-7858 PT Time Calculation (min) (ACUTE ONLY): 53 min  Charges:  $Gait Training: 23-37 mins $Therapeutic Exercise: 23-37 mins                        Doreatha Massed, PT Acute Rehabilitation  Office: [phone] Pager: [phone]

## 2021-06-10 DIAGNOSIS — M1712 Unilateral primary osteoarthritis, left knee: Secondary | ICD-10-CM | POA: Diagnosis not present

## 2021-06-10 DIAGNOSIS — M25562 Pain in left knee: Secondary | ICD-10-CM | POA: Diagnosis not present

## 2021-06-10 DIAGNOSIS — I10 Essential (primary) hypertension: Secondary | ICD-10-CM | POA: Diagnosis not present

## 2021-06-10 DIAGNOSIS — Z87891 Personal history of nicotine dependence: Secondary | ICD-10-CM | POA: Diagnosis not present

## 2021-06-10 DIAGNOSIS — Z79899 Other long term (current) drug therapy: Secondary | ICD-10-CM | POA: Diagnosis not present

## 2021-06-10 DIAGNOSIS — R2689 Other abnormalities of gait and mobility: Secondary | ICD-10-CM | POA: Diagnosis not present

## 2021-06-10 DIAGNOSIS — Z85118 Personal history of other malignant neoplasm of bronchus and lung: Secondary | ICD-10-CM | POA: Diagnosis not present

## 2021-06-10 LAB — BASIC METABOLIC PANEL
Anion gap: 5 (ref 5–15)
BUN: 14 mg/dL (ref 8–23)
CO2: 28 mmol/L (ref 22–32)
Calcium: 8.2 mg/dL — ABNORMAL LOW (ref 8.9–10.3)
Chloride: 98 mmol/L (ref 98–111)
Creatinine, Ser: 0.63 mg/dL (ref 0.44–1.00)
GFR, Estimated: >60 mL/min (ref >60)
Glucose, Bld: 104 mg/dL — ABNORMAL HIGH (ref 70–99)
Potassium: 3.8 mmol/L (ref 3.5–5.1)
Sodium: 131 mmol/L — ABNORMAL LOW (ref 135–145)

## 2021-06-10 LAB — CBC
HCT: 30.2 % — ABNORMAL LOW (ref 36.0–46.0)
Hemoglobin: 10 g/dL — ABNORMAL LOW (ref 12.0–15.0)
MCH: 29.9 pg (ref 26.0–34.0)
MCHC: 33.1 g/dL (ref 30.0–36.0)
MCV: 90.4 fL (ref 80.0–100.0)
Platelets: 242 10*3/uL (ref 150–400)
RBC: 3.34 MIL/uL — ABNORMAL LOW (ref 3.87–5.11)
RDW: 13.7 % (ref 11.5–15.5)
WBC: 10.2 10*3/uL (ref 4.0–10.5)
nRBC: 0 % (ref 0.0–0.2)

## 2021-06-10 MED ORDER — METHOCARBAMOL 500 MG PO TABS: 500.0000 mg | ORAL_TABLET | Freq: Four times a day (QID) | ORAL | 0 refills | Status: DC | PRN

## 2021-06-10 MED ORDER — HYDROMORPHONE HCL 2 MG PO TABS: 2.0000 mg | ORAL_TABLET | Freq: Four times a day (QID) | ORAL | 0 refills | Status: DC | PRN

## 2021-06-10 MED ORDER — TRAMADOL HCL 50 MG PO TABS: 50.0000 mg | ORAL_TABLET | Freq: Four times a day (QID) | ORAL | 0 refills | Status: DC | PRN

## 2021-06-10 MED ORDER — RIVAROXABAN 10 MG PO TABS: 10.0000 mg | ORAL_TABLET | Freq: Every day | ORAL | 0 refills | Status: AC

## 2021-06-10 NOTE — Plan of Care (Signed)

## 2021-06-10 NOTE — Plan of Care (Signed)
  Problem: Activity: Goal: Ability to avoid complications of mobility impairment will improve Outcome: Progressing Goal: Range of joint motion will improve Outcome: Progressing   Problem: Pain Management: Goal: Pain level will decrease with appropriate interventions Outcome: Progressing   

## 2021-06-10 NOTE — Progress Notes (Signed)
Discharge package printed and instructions given to patient. Patient verbalizes understanding. 

## 2021-06-10 NOTE — Progress Notes (Signed)
Physical Therapy Treatment Patient Details Name: Marissa Jacobs MRN: [id number] DOB: 1935-12-08 Today's Date: 06/10/2021   History of Present Illness 85 yo female s/p L TKA 06/08/21. Hx of Afib post op, PTSD    PT Comments    POD # 2 pm session Spouse present during session.  General Comments: AxO x 3 pleasant but required repeat instructions and increased time to process (meds?) slightly foggy. Assisted with amb with instruction to spouse for "hands on" safety.  Practiced one step with 50% VC's on proper tech and safety with spouse. Returned to room and assisted back to bed.  Then returned to room to perform some TE's following HEP handout.  Instructed on proper tech, freq as well as use of ICE.  Marland Kitchen  Addressed all mobility questions, discussed appropriate activity, educated on use of ICE.  Pt ready for D/C to home.   Recommendations for follow up therapy are one component of a multi-disciplinary discharge planning process, led by the attending physician.  Recommendations may be updated based on patient status, additional functional criteria and insurance authorization.  Follow Up Recommendations  Follow physician's recommendations for discharge plan and follow up therapies     Assistance Recommended at Discharge Intermittent Supervision/Assistance  Equipment Recommendations  Rolling walker (2 wheels)    Recommendations for Other Services       Precautions / Restrictions Precautions Precautions: Fall;Knee Precaution Comments: instructed no pillow under knee Restrictions Weight Bearing Restrictions: No Other Position/Activity Restrictions: WBAT     Mobility  Bed Mobility Overal bed mobility: Needs Assistance Bed Mobility: Sit to Supine     Supine to sit: Min guard;Supervision Sit to supine: Min guard   General bed mobility comments: demonstarted and instructed how to use belt to self assist LE    Transfers Overall transfer level: Needs assistance Equipment used:  Rolling walker (2 wheels) Transfers: Sit to/from Stand Sit to Stand: Supervision Stand pivot transfers: Supervision;Min guard         General transfer comment: one VC on proper hand placement and increased time.  Also assisted with a toilet transfer.    Ambulation/Gait Ambulation/Gait assistance: Supervision;Min guard Gait Distance (Feet): 45 Feet Assistive device: Rolling walker (2 wheels) Gait Pattern/deviations: Step-to pattern;Step-through pattern Gait velocity: decreased   General Gait Details: assisted with amb to and from stairs with increased time   Stairs    50% VC's on proper tecxh to use walker forward on ONE step with spouse present "hands on" assisted.          Wheelchair Mobility    Modified Rankin (Stroke Patients Only)       Balance                                            Cognition Arousal/Alertness: Awake/alert Behavior During Therapy: WFL for tasks assessed/performed Overall Cognitive Status: Within Functional Limits for tasks assessed                                 General Comments: AxO x 3 pleasant but required repeat instructions and increased time to process (meds?) slightly foggy        Exercises   Total Knee Replacement TE's following HEP handout 10 reps B LE ankle pumps 05 reps towel squeezes 05 reps knee presses 05 reps heel slides  05  reps SAQ's 05 reps SLR's 05 reps ABD Educated on use of gait belt to assist with TE's Followed by ICE    General Comments        Pertinent Vitals/Pain Pain Assessment: 0-10 Pain Score: 4  Pain Location: knee Pain Descriptors / Indicators: Discomfort;Sore;Aching Pain Intervention(s): Monitored during session;Repositioned;Ice applied    Home Living                          Prior Function            PT Goals (current goals can now be found in the care plan section) Progress towards PT goals: Progressing toward goals     Frequency    7X/week      PT Plan Current plan remains appropriate    Co-evaluation              AM-PAC PT "6 Clicks" Mobility   Outcome Measure  Help needed turning from your back to your side while in a flat bed without using bedrails?: A Little Help needed moving from lying on your back to sitting on the side of a flat bed without using bedrails?: A Little Help needed moving to and from a bed to a chair (including a wheelchair)?: A Little Help needed standing up from a chair using your arms (e.g., wheelchair or bedside chair)?: A Little Help needed to walk in hospital room?: A Little Help needed climbing 3-5 steps with a railing? : A Little 6 Click Score: 18    End of Session Equipment Utilized During Treatment: Gait belt Activity Tolerance: Patient tolerated treatment well Patient left: in bed;with call bell/phone within reach Nurse Communication: Mobility status (pt ready for D/C to home) PT Visit Diagnosis: Pain;Other abnormalities of gait and mobility (R26.89) Pain - Right/Left: Left Pain - part of body: Knee     Time: 1440-1505 PT Time Calculation (min) (ACUTE ONLY): 25 min  Charges:  $Gait Training: 8-22 mins $Therapeutic Exercise: 8-22 mins                    Marissa Jacobs  PTA Acute  Rehabilitation Services Pager      [phone] Office      [phone]

## 2021-06-10 NOTE — TOC Transition Note (Signed)
Transition of Care [facility]) - CM/SW Discharge Note   Patient Details  Name: Marissa Jacobs MRN: [id number] Date of Birth: 11/14/1935  [facility] CM/SW Contact:  Lennart Pall, LCSW Phone Number: 06/10/2021, 12:30 PM   Clinical Narrative:     Alerted by RN that pt DOES need a rw - pt reports that she has a 4 wheeled rollator at home and insurance did cover that.  She understands that she will have to privately pay for another walker and is agreeable.  Have placed order with [employer] for delivery to pt's room prior to dc.  Final next level of care: OP Rehab Barriers to Discharge: No Barriers Identified   Patient Goals and CMS Choice Patient states their goals for this hospitalization and ongoing recovery are:: return home      Discharge Placement                       Discharge Plan and Services                DME Arranged: N/A                    Social Determinants of Health (SDOH) Interventions     Readmission Risk Interventions No flowsheet data found.

## 2021-06-10 NOTE — Progress Notes (Signed)
   Subjective: 2 Days Post-Op Procedure(s) (LRB): TOTAL KNEE ARTHROPLASTY (Left) Patient reports pain as mild.   Plan is to go Home after hospital stay.  Objective: Vital signs in last 24 hours: Temp:  [97.6 F (36.4 C)-98.3 F (36.8 C)] 97.6 F (36.4 C) (10/26 6789) Pulse Rate:  [81-89] 81 (10/26 0613) Resp:  [16-18] 16 (10/26 0613) BP: (140-147)/(67-78) 145/75 (10/26 0613) SpO2:  [94 %-97 %] 94 % (10/26 3810)  Intake/Output from previous day:  Intake/Output Summary (Last 24 hours) at 06/10/2021 0717 Last data filed at 06/10/2021 0000 Gross per 24 hour  Intake 743.01 ml  Output 400 ml  Net 343.01 ml    Intake/Output this shift: No intake/output data recorded.  Labs: Recent Labs    06/09/21 0326 06/10/21 0317  HGB 10.9* 10.0*   Recent Labs    06/09/21 0326 06/10/21 0317  WBC 14.2* 10.2  RBC 3.65* 3.34*  HCT 33.0* 30.2*  PLT 271 242   Recent Labs    06/09/21 0326 06/10/21 0317  NA 130* 131*  K 3.9 3.8  CL 98 98  CO2 26 28  BUN 13 14  CREATININE 0.68 0.63  GLUCOSE 124* 104*  CALCIUM 8.5* 8.2*   No results for input(s): LABPT, INR in the last 72 hours.  EXAM General - Patient is Alert, Appropriate, and Oriented Extremity - Neurologically intact Neurovascular intact No cellulitis present Compartment soft Dressing/Incision - clean, dry, no drainage Motor Function - intact, moving foot and toes well on exam.   Past Medical History:  Diagnosis Date   Anemia    secondary to taking omeprazole-on iron   Cancer ([place]) 08/2020   lung   Diverticulosis 09/2002   Dysrhythmia    a-fib after surgery 09/04/20. treated with Metoprolol for 6 week   GERD (gastroesophageal reflux disease)    Hyperlipidemia    Hypertension    Lung nodule    Osteopenia    Post traumatic stress disorder    STD (sexually transmitted disease)    HSV    Assessment/Plan: 2 Days Post-Op Procedure(s) (LRB): TOTAL KNEE ARTHROPLASTY (Left) Principal Problem:   Osteoarthritis  of knee Active Problems:   Primary osteoarthritis of left knee   Up with therapy Discharge home after PT   Marissa Jacobs 06/10/2021, 7:17 AM

## 2021-06-10 NOTE — Progress Notes (Signed)
Physical Therapy Treatment Patient Details Name: Marissa Jacobs MRN: [id number] DOB: 1935-11-02 Today's Date: 06/10/2021   History of Present Illness 85 yo female s/p L TKA 06/08/21. Hx of Afib post op, PTSD    PT Comments    POD # 1 am session General Comments: AxO x 3 pleasant.  Assisted OOB.  General bed mobility comments: demonstarted and instructed how to use belt to self assist LE.  General transfer comment: one VC on proper hand placement and increased time.  Also assisted with a toilet transfer. General Gait Details: assisted with amb to bathroom then a functional distance in hallway with 25% VC's on proper sequencing and safety with turns. Assisted to recliner.  Unable to perform TE's due to increased pain level.  HEP handout issued.  Pt will need another PT session to address stairs.   Recommendations for follow up therapy are one component of a multi-disciplinary discharge planning process, led by the attending physician.  Recommendations may be updated based on patient status, additional functional criteria and insurance authorization.  Follow Up Recommendations  Follow physician's recommendations for discharge plan and follow up therapies     Assistance Recommended at Discharge Intermittent Supervision/Assistance  Equipment Recommendations  Rolling walker (2 wheels) ([facility])    Recommendations for Other Services       Precautions / Restrictions Precautions Precautions: Fall;Knee Precaution Comments: instructed no pillow under knee Restrictions Weight Bearing Restrictions: No Other Position/Activity Restrictions: WBAT     Mobility  Bed Mobility Overal bed mobility: Needs Assistance Bed Mobility: Supine to Sit     Supine to sit: Min guard;Supervision     General bed mobility comments: demonstarted and instructed how to use belt to self assist LE    Transfers Overall transfer level: Needs assistance Equipment used: Rolling walker (2 wheels) Transfers: Sit  to/from Stand Sit to Stand: Supervision Stand pivot transfers: Supervision;Min guard         General transfer comment: one VC on proper hand placement and increased time.  Also assisted with a toilet transfer.    Ambulation/Gait Ambulation/Gait assistance: Supervision;Min guard Gait Distance (Feet): 65 Feet Assistive device: Rolling walker (2 wheels) Gait Pattern/deviations: Step-to pattern;Step-through pattern Gait velocity: decreased   General Gait Details: assisted with amb to bathroom then a functional distance in hallway with 25% VC's on proper sequencing and safety with turns.   Stairs             Wheelchair Mobility    Modified Rankin (Stroke Patients Only)       Balance                                            Cognition Arousal/Alertness: Awake/alert Behavior During Therapy: WFL for tasks assessed/performed Overall Cognitive Status: Within Functional Limits for tasks assessed                                 General Comments: AxO x 3 pleasant        Exercises  10 reps AP and knee presses    General Comments        Pertinent Vitals/Pain Pain Assessment: 0-10 Pain Score: 4  Pain Location: knee Pain Descriptors / Indicators: Discomfort;Sore;Aching Pain Intervention(s): Monitored during session;Repositioned;Ice applied    Home Living  Prior Function            PT Goals (current goals can now be found in the care plan section) Progress towards PT goals: Progressing toward goals    Frequency    7X/week      PT Plan Current plan remains appropriate    Co-evaluation              AM-PAC PT "6 Clicks" Mobility   Outcome Measure    Help needed moving from lying on your back to sitting on the side of a flat bed without using bedrails?: A Little Help needed moving to and from a bed to a chair (including a wheelchair)?: A Little Help needed standing up from a  chair using your arms (e.g., wheelchair or bedside chair)?: A Little Help needed to walk in hospital room?: A Little Help needed climbing 3-5 steps with a railing? : A Little 6 Click Score: 15    End of Session Equipment Utilized During Treatment: Gait belt Activity Tolerance: Patient tolerated treatment well Patient left: in chair;with call bell/phone within reach Nurse Communication: Mobility status PT Visit Diagnosis: Pain;Other abnormalities of gait and mobility (R26.89) Pain - Right/Left: Left Pain - part of body: Knee     Time: 0950-1020 PT Time Calculation (min) (ACUTE ONLY): 30 min  Charges:  $Gait Training: 8-22 mins $Therapeutic Activity: 8-22 mins                    [name]  PTA Acute  Rehabilitation Services Pager      [phone] Office      [phone]

## 2021-06-11 DIAGNOSIS — M25562 Pain in left knee: Secondary | ICD-10-CM | POA: Diagnosis not present

## 2021-06-15 DIAGNOSIS — M25562 Pain in left knee: Secondary | ICD-10-CM | POA: Diagnosis not present

## 2021-06-15 NOTE — Discharge Summary (Signed)
Physician Discharge Summary   Patient ID: Marissa Jacobs MRN: [id number] DOB/AGE: December 05, 1935 85 y.o.  Admit date: 06/08/2021 Discharge date: 06/10/2021  Primary Diagnosis: Osteoarthritis, left knee   Admission Diagnoses:  Past Medical History:  Diagnosis Date   Anemia    secondary to taking omeprazole-on iron   Cancer ([place]) 08/2020   lung   Diverticulosis 09/2002   Dysrhythmia    a-fib after surgery 09/04/20. treated with Metoprolol for 6 week   GERD (gastroesophageal reflux disease)    Hyperlipidemia    Hypertension    Lung nodule    Osteopenia    Post traumatic stress disorder    STD (sexually transmitted disease)    HSV   Discharge Diagnoses:   Principal Problem:   Osteoarthritis of knee Active Problems:   Primary osteoarthritis of left knee  Estimated body mass index is 26.45 kg/m as calculated from the following:   Height as of this encounter: 5\' 1"  (1.549 m).   Weight as of this encounter: 63.5 kg.  Procedure:  Procedure(s) (LRB): TOTAL KNEE ARTHROPLASTY (Left)   Consults: None  HPI: Marissa Jacobs is a 85 y.o. year old female with end stage OA of her left knee with progressively worsening pain and dysfunction. She has constant pain, with activity and at rest and significant functional deficits with difficulties even with ADLs. She has had extensive non-op management including analgesics, injections of cortisone and viscosupplements, and home exercise program, but remains in significant pain with significant dysfunction. Radiographs show bone on bone arthritis lateral and patellofemoral. She presents now for left Total Knee Arthroplasty.     Laboratory Data: Admission on 06/08/2021, Discharged on 06/10/2021  Component Date Value Ref Range Status   ABO/RH(D) 06/08/2021    Final                   Value:O POS Performed at [place] [address] [place], [place] [address]    WBC 06/09/2021 14.2 (A)  4.0 - 10.5 K/uL Final   RBC  06/09/2021 3.65 (A)  3.87 - 5.11 MIL/uL Final   Hemoglobin 06/09/2021 10.9 (A)  12.0 - 15.0 g/dL Final   HCT 06/09/2021 33.0 (A)  36.0 - 46.0 % Final   MCV 06/09/2021 90.4  80.0 - 100.0 fL Final   MCH 06/09/2021 29.9  26.0 - 34.0 pg Final   MCHC 06/09/2021 33.0  30.0 - 36.0 g/dL Final   RDW 06/09/2021 13.2  11.5 - 15.5 % Final   Platelets 06/09/2021 271  150 - 400 K/uL Final   nRBC 06/09/2021 0.0  0.0 - 0.2 % Final   Performed at [facility], [place] [address], [place], [place] [address]   Sodium 06/09/2021 130 (A)  135 - 145 mmol/L Final   Potassium 06/09/2021 3.9  3.5 - 5.1 mmol/L Final   Chloride 06/09/2021 98  98 - 111 mmol/L Final   CO2 06/09/2021 26  22 - 32 mmol/L Final   Glucose, Bld 06/09/2021 124 (A)  70 - 99 mg/dL Final   Glucose reference range applies only to samples taken after fasting for at least 8 hours.   BUN 06/09/2021 13  8 - 23 mg/dL Final   Creatinine, Ser 06/09/2021 0.68  0.44 - 1.00 mg/dL Final   Calcium 06/09/2021 8.5 (A)  8.9 - 10.3 mg/dL Final   GFR, Estimated 06/09/2021 >60  >60 mL/min Final   Comment: (NOTE) Calculated using the CKD-EPI Creatinine Equation (2021)    Anion  gap 06/09/2021 6  5 - 15 Final   Performed at [facility], [place] [address] [place], [place] [address]   WBC 06/10/2021 10.2  4.0 - 10.5 K/uL Final   RBC 06/10/2021 3.34 (A)  3.87 - 5.11 MIL/uL Final   Hemoglobin 06/10/2021 10.0 (A)  12.0 - 15.0 g/dL Final   HCT 06/10/2021 30.2 (A)  36.0 - 46.0 % Final   MCV 06/10/2021 90.4  80.0 - 100.0 fL Final   MCH 06/10/2021 29.9  26.0 - 34.0 pg Final   MCHC 06/10/2021 33.1  30.0 - 36.0 g/dL Final   RDW 06/10/2021 13.7  11.5 - 15.5 % Final   Platelets 06/10/2021 242  150 - 400 K/uL Final   nRBC 06/10/2021 0.0  0.0 - 0.2 % Final   Performed at [facility], [place] [address], [place], [place] [address]   Sodium 06/10/2021 131 (A)  135 - 145 mmol/L Final   Potassium 06/10/2021 3.8  3.5 - 5.1  mmol/L Final   Chloride 06/10/2021 98  98 - 111 mmol/L Final   CO2 06/10/2021 28  22 - 32 mmol/L Final   Glucose, Bld 06/10/2021 104 (A)  70 - 99 mg/dL Final   Glucose reference range applies only to samples taken after fasting for at least 8 hours.   BUN 06/10/2021 14  8 - 23 mg/dL Final   Creatinine, Ser 06/10/2021 0.63  0.44 - 1.00 mg/dL Final   Calcium 06/10/2021 8.2 (A)  8.9 - 10.3 mg/dL Final   GFR, Estimated 06/10/2021 >60  >60 mL/min Final   Comment: (NOTE) Calculated using the CKD-EPI Creatinine Equation (2021)    Anion gap 06/10/2021 5  5 - 15 Final   Performed at [facility], [place] [address], [place], [place] [address]  Orders Only on 06/04/2021  Component Date Value Ref Range Status   SARS Coronavirus 2 06/04/2021 RESULT: NEGATIVE   Final   Comment: RESULT: NEGATIVESARS-CoV-2 INTERPRETATION:A NEGATIVE  test result means that SARS-CoV-2 RNA was not present in the specimen above the limit of detection of this test. This does not preclude a possible SARS-CoV-2 infection and should not be used as the  sole basis for patient management decisions. Negative results must be combined with clinical observations, patient history, and epidemiological information. Optimum specimen types and timing for peak viral levels during infections caused by SARS-CoV-2  have not been determined. Collection of multiple specimens or types of specimens may be necessary to detect virus. Improper specimen collection and handling, sequence variability under primers/probes, or organism present below the limit of detection may  lead to false negative results. Positive and negative predictive values of testing are highly dependent on prevalence. False negative test results are more likely when prevalence of disease is high.The expected result is NEGATIVE.Fact S                          heet for  Healthcare Providers: LocalChronicle.no Sheet for Patients:  SalonLookup.es Reference Range - Negative   Hospital Outpatient Visit on 05/26/2021  Component Date Value Ref Range Status   ABO/RH(D) 05/26/2021 O POS   Final   Antibody Screen 05/26/2021 NEG   Final   Sample Expiration 05/26/2021 06/11/2021,2359   Final   Extend sample reason 05/26/2021    Final                   Value:NO TRANSFUSIONS OR PREGNANCY IN THE PAST 3  MONTHS Performed at [facility], [place] [address] [place], [place] [address]    MRSA, PCR 05/26/2021 NEGATIVE  NEGATIVE Final   Staphylococcus aureus 05/26/2021 NEGATIVE  NEGATIVE Final   Comment: (NOTE) The Xpert SA Assay (FDA approved for NASAL specimens in patients 6 years of age and older), is one component of a comprehensive surveillance program. It is not intended to diagnose infection nor to guide or monitor treatment. Performed at [facility], [place] [address] [place], [place] [address]    WBC 05/26/2021 7.3  4.0 - 10.5 K/uL Final   RBC 05/26/2021 4.51  3.87 - 5.11 MIL/uL Final   Hemoglobin 05/26/2021 13.5  12.0 - 15.0 g/dL Final   HCT 05/26/2021 40.3  36.0 - 46.0 % Final   MCV 05/26/2021 89.4  80.0 - 100.0 fL Final   MCH 05/26/2021 29.9  26.0 - 34.0 pg Final   MCHC 05/26/2021 33.5  30.0 - 36.0 g/dL Final   RDW 05/26/2021 13.2  11.5 - 15.5 % Final   Platelets 05/26/2021 346  150 - 400 K/uL Final   nRBC 05/26/2021 0.0  0.0 - 0.2 % Final   Performed at [facility], [place] [address], [place], [place] [address]   Sodium 05/26/2021 134 (A)  135 - 145 mmol/L Final   Potassium 05/26/2021 4.0  3.5 - 5.1 mmol/L Final   Chloride 05/26/2021 97 (A)  98 - 111 mmol/L Final   CO2 05/26/2021 30  22 - 32 mmol/L Final   Glucose, Bld 05/26/2021 100 (A)  70 - 99 mg/dL Final   Glucose reference range applies only to samples taken after fasting for at least 8 hours.   BUN 05/26/2021 11  8 - 23 mg/dL Final   Creatinine, Ser 05/26/2021 0.59  0.44  - 1.00 mg/dL Final   Calcium 05/26/2021 9.0  8.9 - 10.3 mg/dL Final   Total Protein 05/26/2021 7.8  6.5 - 8.1 g/dL Final   Albumin 05/26/2021 4.2  3.5 - 5.0 g/dL Final   AST 05/26/2021 26  15 - 41 U/L Final   ALT 05/26/2021 20  0 - 44 U/L Final   Alkaline Phosphatase 05/26/2021 72  38 - 126 U/L Final   Total Bilirubin 05/26/2021 0.8  0.3 - 1.2 mg/dL Final   GFR, Estimated 05/26/2021 >60  >60 mL/min Final   Comment: (NOTE) Calculated using the CKD-EPI Creatinine Equation (2021)    Anion gap 05/26/2021 7  5 - 15 Final   Performed at [facility], [place] [address] [place], [place] [address]   Prothrombin Time 05/26/2021 12.8  11.4 - 15.2 seconds Final   INR 05/26/2021 1.0  0.8 - 1.2 Final   Comment: (NOTE) INR goal varies based on device and disease states. Performed at [facility], [place] [address], [place], [place] [address]      X-Rays:No results found.  EKG: Orders placed or performed in visit on 03/24/21   EKG 12-Lead     Hospital Course: Marissa Jacobs is a 85 y.o. who was admitted to [facility]. They were brought to the operating room on 06/08/2021 and underwent Procedure(s): TOTAL KNEE ARTHROPLASTY.  Patient tolerated the procedure well and was later transferred to the recovery room and then to the orthopaedic floor for postoperative care. They were given PO and IV analgesics for pain control following their surgery. They were given 24 hours of postoperative antibiotics of  Anti-infectives (From admission, onward)  Start     Dose/Rate Route Frequency Ordered Stop   06/08/21 1530  ceFAZolin (ANCEF) IVPB 2g/100 mL premix        2 g 200 mL/hr over 30 Minutes Intravenous Every 6 hours 06/08/21 1356 06/08/21 2251   06/08/21 0700  ceFAZolin (ANCEF) IVPB 2g/100 mL premix        2 g 200 mL/hr over 30 Minutes Intravenous On call to O.R. 06/08/21 7902 06/08/21 1000      and started on DVT prophylaxis in the form of Xarelto.   PT  and OT were ordered for total joint protocol. Discharge planning consulted to help with postop disposition and equipment needs. Patient had a good night on the evening of surgery. They started to get up OOB with therapy on POD #0. Continued to work with therapy into POD #2. Pt was seen during rounds on day two and was ready to go home pending progress with therapy. Dressing was changed and the incision was clean, dry, and intact. Pt worked with therapy for two additional sessions and was meeting their goals. She was discharged to home later that day in stable condition.  Diet: Regular diet Activity: WBAT Follow-up: in 2 weeks Disposition: Home with OPPT Discharged Condition: stable   Discharge Instructions     Call MD / Call 911   Complete by: As directed    If you experience chest pain or shortness of breath, CALL 911 and be transported to the hospital emergency room.  If you develope a fever above 101 F, pus (white drainage) or increased drainage or redness at the wound, or calf pain, call your surgeon's office.   Change dressing   Complete by: As directed    You may remove the bulky bandage (ACE wrap and gauze) two days after surgery. You will have an adhesive waterproof bandage underneath. Leave this in place until your first follow-up appointment.   Constipation Prevention   Complete by: As directed    Drink plenty of fluids.  Prune juice may be helpful.  You may use a stool softener, such as Colace (over the counter) 100 mg twice a day.  Use MiraLax (over the counter) for constipation as needed.   Diet - low sodium heart healthy   Complete by: As directed    Do not put a pillow under the knee. Place it under the heel.   Complete by: As directed    Driving restrictions   Complete by: As directed    No driving for two weeks   Post-operative opioid taper instructions:   Complete by: As directed    POST-OPERATIVE OPIOID TAPER INSTRUCTIONS: It is important to wean off of your opioid  medication as soon as possible. If you do not need pain medication after your surgery it is ok to stop day one. Opioids include: Codeine, Hydrocodone(Norco, Vicodin), Oxycodone(Percocet, oxycontin) and hydromorphone amongst others.  Long term and even short term use of opiods can cause: Increased pain response Dependence Constipation Depression Respiratory depression And more.  Withdrawal symptoms can include Flu like symptoms Nausea, vomiting And more Techniques to manage these symptoms Hydrate well Eat regular healthy meals Stay active Use relaxation techniques(deep breathing, meditating, yoga) Do Not substitute Alcohol to help with tapering If you have been on opioids for less than two weeks and do not have pain than it is ok to stop all together.  Plan to wean off of opioids This plan should start within one week post op of your joint replacement. Maintain  the same interval or time between taking each dose and first decrease the dose.  Cut the total daily intake of opioids by one tablet each day Next start to increase the time between doses. The last dose that should be eliminated is the evening dose.      TED hose   Complete by: As directed    Use stockings (TED hose) for three weeks on both leg(s).  You may remove them at night for sleeping.   Weight bearing as tolerated   Complete by: As directed       Allergies as of 06/10/2021       Reactions   Abobotulinumtoxina Rash   Something like Botox   Codeine Hives   Other    Pt went into AFIB after having a procedure on 09/05/20   Shellfish Allergy Nausea And Vomiting   Crab Meat - nausea and vomiting    Amoxicillin Itching, Rash        Medication List     STOP taking these medications    b complex vitamins capsule   B-12 1000 MCG Subl   CALCIUM-MAGNESIUM-VITAMIN D PO   Co Q 10 100 MG Caps   Fish Oil 5621 MG Caps   folic acid 308 MCG tablet Commonly known as: FOLVITE   Grape Seed Extract 100 MG  Caps   Magnesium 300 MG Caps   multivitamin with minerals tablet   pyridOXINE 50 MG tablet Commonly known as: VITAMIN B-6   Quercetin 500 MG Caps   Red Yeast Rice 600 MG Caps   vitamin C 1000 MG tablet   Vitamin D3 125 MCG (5000 UT) Caps   zinc gluconate 50 MG tablet       TAKE these medications    EpiPen 2-Pak 0.3 mg/0.3 mL Soaj injection Generic drug: EPINEPHrine Inject 0.3 mg into the muscle as needed for anaphylaxis [employer] fast streamer).   estradiol 0.025 MG/24HR Commonly known as: VIVELLE-DOT Place 1 patch onto the skin 2 (two) times a week.   fexofenadine 180 MG tablet Commonly known as: ALLEGRA Take 180 mg by mouth daily as needed for allergies.   hydrochlorothiazide 12.5 MG capsule Commonly known as: MICROZIDE Take 12.5 mg by mouth daily.   HYDROmorphone 2 MG tablet Commonly known as: DILAUDID Take 1-2 tablets (2-4 mg total) by mouth every 6 (six) hours as needed for severe pain.   losartan 50 MG tablet Commonly known as: COZAAR Take 1 tablet (50 mg total) by mouth daily.   Lumify 0.025 % Soln Generic drug: Brimonidine Tartrate Place 1 drop into both eyes daily.   methocarbamol 500 MG tablet Commonly known as: ROBAXIN Take 1 tablet (500 mg total) by mouth every 6 (six) hours as needed for muscle spasms.   nitroGLYCERIN 0.4 MG SL tablet Commonly known as: NITROSTAT Place 1 tablet (0.4 mg total) under the tongue every 5 (five) minutes as needed for chest pain.   omeprazole 10 MG capsule Commonly known as: PRILOSEC Take 10 mg by mouth daily as needed (acid reflux).   progesterone 100 MG capsule Commonly known as: PROMETRIUM Take 100 mg by mouth See admin instructions. Take daily the 1st - 15th of each month   rivaroxaban 10 MG Tabs tablet Commonly known as: XARELTO Take 1 tablet (10 mg total) by mouth daily with breakfast for 19 days. Then take one 81 mg aspirin once a day for three weeks. Then discontinue aspirin.   traMADol 50 MG tablet Commonly  known as: ULTRAM Take 1-2 tablets (  50-100 mg total) by mouth every 6 (six) hours as needed for moderate pain.   tretinoin 0.025 % cream Commonly known as: RETIN-A Apply 1 application topically at bedtime.               Discharge Care Instructions  (From admission, onward)           Start     Ordered   06/10/21 0000  Weight bearing as tolerated        06/10/21 0722   06/10/21 0000  Change dressing       Comments: You may remove the bulky bandage (ACE wrap and gauze) two days after surgery. You will have an adhesive waterproof bandage underneath. Leave this in place until your first follow-up appointment.   06/10/21 6979            Follow-up Information     Gaynelle Arabian, MD. Go on 06/23/2021.   Specialty: Orthopedic Surgery Why: You are scheduled for first post op appointment on Tuesday Novemeber 8th at 1:45pm. Contact information: [address] [place] [address] [phone]         Rosilyn Mings.. Go on 06/11/2021.   Why: You are scheduled for physical therapy evaluation on Thursday October 27th at 1:45pm. Contact information: [place] [address] [place] [address] [phone]                 Signed: Theresa Duty, PA-C Orthopedic Surgery 06/15/2021, 7:38 AM

## 2021-06-18 DIAGNOSIS — M25562 Pain in left knee: Secondary | ICD-10-CM | POA: Diagnosis not present

## 2021-06-22 DIAGNOSIS — M25562 Pain in left knee: Secondary | ICD-10-CM | POA: Diagnosis not present

## 2021-06-24 DIAGNOSIS — G629 Polyneuropathy, unspecified: Secondary | ICD-10-CM | POA: Diagnosis not present

## 2021-06-24 DIAGNOSIS — R5383 Other fatigue: Secondary | ICD-10-CM | POA: Diagnosis not present

## 2021-06-24 DIAGNOSIS — M25562 Pain in left knee: Secondary | ICD-10-CM | POA: Diagnosis not present

## 2021-06-26 DIAGNOSIS — M25562 Pain in left knee: Secondary | ICD-10-CM | POA: Diagnosis not present

## 2021-06-29 DIAGNOSIS — M25562 Pain in left knee: Secondary | ICD-10-CM | POA: Diagnosis not present

## 2021-07-01 DIAGNOSIS — M25562 Pain in left knee: Secondary | ICD-10-CM | POA: Diagnosis not present

## 2021-07-03 DIAGNOSIS — M25562 Pain in left knee: Secondary | ICD-10-CM | POA: Diagnosis not present

## 2021-07-06 DIAGNOSIS — M25562 Pain in left knee: Secondary | ICD-10-CM | POA: Diagnosis not present

## 2021-07-07 DIAGNOSIS — C3492 Malignant neoplasm of unspecified part of left bronchus or lung: Secondary | ICD-10-CM | POA: Diagnosis not present

## 2021-07-07 DIAGNOSIS — Z9889 Other specified postprocedural states: Secondary | ICD-10-CM | POA: Diagnosis not present

## 2021-07-07 DIAGNOSIS — R911 Solitary pulmonary nodule: Secondary | ICD-10-CM | POA: Diagnosis not present

## 2021-07-08 DIAGNOSIS — M25562 Pain in left knee: Secondary | ICD-10-CM | POA: Diagnosis not present

## 2021-07-13 ENCOUNTER — Other Ambulatory Visit (HOSPITAL_COMMUNITY)
Admission: RE | Admit: 2021-07-13 | Discharge: 2021-07-13 | Disposition: A | Discharge status: Home / Self Care | Payer: Medicare HMO | Source: Ambulatory Visit | Attending: Obstetrics & Gynecology | Admitting: Obstetrics & Gynecology

## 2021-07-13 ENCOUNTER — Other Ambulatory Visit: Payer: Self-pay

## 2021-07-13 ENCOUNTER — Encounter (HOSPITAL_BASED_OUTPATIENT_CLINIC_OR_DEPARTMENT_OTHER): Payer: Self-pay | Admitting: Obstetrics & Gynecology

## 2021-07-13 ENCOUNTER — Ambulatory Visit (HOSPITAL_BASED_OUTPATIENT_CLINIC_OR_DEPARTMENT_OTHER): Payer: Medicare HMO | Admitting: Obstetrics & Gynecology

## 2021-07-13 ENCOUNTER — Other Ambulatory Visit: Payer: Self-pay | Admitting: Obstetrics & Gynecology

## 2021-07-13 VITALS — BP 140/56 | HR 82 | Ht 60.0 in | Wt 138.2 lb

## 2021-07-13 DIAGNOSIS — N3281 Overactive bladder: Secondary | ICD-10-CM | POA: Diagnosis not present

## 2021-07-13 DIAGNOSIS — Z9189 Other specified personal risk factors, not elsewhere classified: Secondary | ICD-10-CM

## 2021-07-13 DIAGNOSIS — Z79890 Hormone replacement therapy (postmenopausal): Secondary | ICD-10-CM

## 2021-07-13 DIAGNOSIS — B009 Herpesviral infection, unspecified: Secondary | ICD-10-CM

## 2021-07-13 DIAGNOSIS — Z124 Encounter for screening for malignant neoplasm of cervix: Secondary | ICD-10-CM | POA: Diagnosis not present

## 2021-07-13 DIAGNOSIS — Z1231 Encounter for screening mammogram for malignant neoplasm of breast: Secondary | ICD-10-CM

## 2021-07-13 DIAGNOSIS — M8580 Other specified disorders of bone density and structure, unspecified site: Secondary | ICD-10-CM

## 2021-07-13 DIAGNOSIS — M25562 Pain in left knee: Secondary | ICD-10-CM | POA: Diagnosis not present

## 2021-07-13 MED ORDER — ESTRADIOL 0.025 MG/24HR TD PTTW: 1.0000 | MEDICATED_PATCH | TRANSDERMAL | 4 refills | Status: DC

## 2021-07-13 MED ORDER — PROGESTERONE MICRONIZED 100 MG PO CAPS: 100.0000 mg | ORAL_CAPSULE | Freq: Every day | ORAL | 6 refills | Status: DC

## 2021-07-13 NOTE — Progress Notes (Signed)
85 y.o. G98P1011 Divorced White or Caucasian female here for breast and pelvic exam.  I am also following her for HRT use.  Pt had left knee replacement about 5 weeks ago.  She is doing really well.  She is still taking a baby aspirin.  Pt has restarted her patch.  We have discussed post op DVT risk with this.    Has appt on Thursday with Dr. Maureen Ralphs.    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  A section of the upper lobe was removed.  She never stopped her patch during this surgery.  She is followed by Dr. Hervey Ard at [facility].  Surgeyr was done robotically.    Denies vaginal bleeding.  Patient's last menstrual period was 08/16/1998.          Sexually active: not right now due to surgery H/O STD:  yes, h/o HSV  Health Maintenance: PCP:  Dr. Virgina Jock.  Last wellness appt was 06/06/2021.  Did blood work at that appt:  yes.  Pt reports everything was really good except her Vit D was a little low Vaccines are up to date:  pt is aware I do not have last date for tdap Colonoscopy:  12/2010.  Follow up 10 years recommended. MMG:  07/16/2020 Negative BMD:  08/15/2019 Last pap smear:  05/23/2019 Negative.   H/o abnormal pap smear:  h/o LEEP   reports that she quit smoking about 58 years ago. Her smoking use included cigarettes. She has never used smokeless tobacco. She reports current alcohol use of about 5.0 standard drinks per week. She reports that she does not use drugs.  Past Medical History:  Diagnosis Date   Anemia    secondary to taking omeprazole-on iron   Cancer ([place]) 08/2020   lung   Diverticulosis 09/2002   Dysrhythmia    a-fib after surgery 09/04/20. treated with Metoprolol for 6 week   GERD (gastroesophageal reflux disease)    Hyperlipidemia    Hypertension    Lung nodule    Osteopenia    Post traumatic stress disorder    STD (sexually transmitted disease)    HSV    Past Surgical History:  Procedure Laterality  Date   APPENDECTOMY     CERVICAL BIOPSY  W/ LOOP ELECTRODE EXCISION     CIN1   COLPOSCOPY     ESOPHAGEAL DILATION  08/17/2011   FACIAL COSMETIC SURGERY  04/16/2002   KNEE SURGERY     LUNG REMOVAL, PARTIAL Left 08/2020   no chemo or radiation needed   TONSILLECTOMY     TOTAL KNEE ARTHROPLASTY Left 06/08/2021   Procedure: TOTAL KNEE ARTHROPLASTY;  Surgeon: Gaynelle Arabian, MD;  Location: [facility] ORS;  Service: Orthopedics;  Laterality: Left;    Current Outpatient Medications  Medication Sig Dispense Refill   Brimonidine Tartrate (LUMIFY) 0.025 % SOLN Place 1 drop into both eyes daily.     EPIPEN 2-PAK 0.3 MG/0.3ML SOAJ injection Inject 0.3 mg into the muscle as needed for anaphylaxis [employer] fast streamer).     estradiol (VIVELLE-DOT) 0.025 MG/24HR Place 1 patch onto the skin 2 (two) times a week. 24 patch 4   fexofenadine (ALLEGRA) 180 MG tablet Take 180 mg by mouth daily as needed for allergies.     hydrochlorothiazide (MICROZIDE) 12.5 MG capsule Take 12.5 mg by mouth daily.     losartan (COZAAR) 50 MG tablet Take 1 tablet (50 mg total) by mouth daily. 90 tablet  3   methocarbamol (ROBAXIN) 500 MG tablet Take 1 tablet (500 mg total) by mouth every 6 (six) hours as needed for muscle spasms. 40 tablet 0   nitroGLYCERIN (NITROSTAT) 0.4 MG SL tablet Place 1 tablet (0.4 mg total) under the tongue every 5 (five) minutes as needed for chest pain. 30 tablet 0   omeprazole (PRILOSEC) 10 MG capsule Take 10 mg by mouth daily as needed (acid reflux).  11   progesterone (PROMETRIUM) 100 MG capsule Take 100 mg by mouth See admin instructions. Take daily the 1st - 15th of each month     tretinoin (RETIN-A) 0.025 % cream Apply 1 application topically at bedtime.      HYDROmorphone (DILAUDID) 2 MG tablet Take 1-2 tablets (2-4 mg total) by mouth every 6 (six) hours as needed for severe pain. (Patient not taking: Reported on 07/13/2021) 42 tablet 0   traMADol (ULTRAM) 50 MG tablet Take 1-2 tablets (50-100 mg total) by mouth  every 6 (six) hours as needed for moderate pain. (Patient not taking: Reported on 07/13/2021) 40 tablet 0   No current facility-administered medications for this visit.    Family History  Problem Relation Age of Onset   COPD Mother    Sudden death Father 39   Healthy Brother     Review of Systems  All other systems reviewed and are negative.  Exam:   BP (!) 140/56 (BP Location: Right Arm, Patient Position: Sitting, Cuff Size: Normal)   Pulse 82   Ht 5' (1.524 m)   Wt 138 lb 3.2 oz (62.7 kg)   LMP 08/16/1998   BMI 26.99 kg/m   Height: 5' (152.4 cm)  General appearance: alert, cooperative and appears stated age Breasts: normal appearance, no masses or tenderness Abdomen: soft, non-tender; bowel sounds normal; no masses,  no organomegaly Lymph nodes: Cervical, supraclavicular, and axillary nodes normal.  No abnormal inguinal nodes palpated Neurologic: Grossly normal  Pelvic: External genitalia:  no lesions              Urethra:  normal appearing urethra with no masses, tenderness or lesions              Bartholins and Skenes: normal                 Vagina: normal appearing vagina with atrophic changes and no discharge, no lesions              Cervix: no lesions              Pap taken: Yes.   Bimanual Exam:  Uterus:  normal size, contour, position, consistency, mobility, non-tender              Adnexa: normal adnexa and no mass, fullness, tenderness               Rectovaginal: Confirms               Anus:  normal sphincter tone, no lesions  Chaperone, Octaviano Batty, CMA, was present for exam.  Assessment/Plan: 1. GYN exam for high-risk Medicare patient - pap obtained today - MMG 07/2020 - BMD 12/202 - colonoscopy 2012.  Follow up not recommended due to age. - lab work done with Dr. Virgina Jock - vaccines updated/reviewed  2. Cervical cancer screening - Cytology - PAP( [place]) - PR OBTAINING SCREEN PAP SMEAR  3. Hormone replacement therapy (HRT) - RFS completed for  prometrium and vivelle dot  4. HSV infection - no recent infection  5. OAB (overactive bladder) - PT order  6. Osteopenia, unspecified location

## 2021-07-14 LAB — CYTOLOGY - PAP: Diagnosis: NEGATIVE

## 2021-07-15 DIAGNOSIS — M25562 Pain in left knee: Secondary | ICD-10-CM | POA: Diagnosis not present

## 2021-07-16 DIAGNOSIS — Z96652 Presence of left artificial knee joint: Secondary | ICD-10-CM | POA: Diagnosis not present

## 2021-07-16 DIAGNOSIS — Z471 Aftercare following joint replacement surgery: Secondary | ICD-10-CM | POA: Diagnosis not present

## 2021-07-17 DIAGNOSIS — M25562 Pain in left knee: Secondary | ICD-10-CM | POA: Diagnosis not present

## 2021-07-20 DIAGNOSIS — M25562 Pain in left knee: Secondary | ICD-10-CM | POA: Diagnosis not present

## 2021-07-22 DIAGNOSIS — M25562 Pain in left knee: Secondary | ICD-10-CM | POA: Diagnosis not present

## 2021-07-24 DIAGNOSIS — M25562 Pain in left knee: Secondary | ICD-10-CM | POA: Diagnosis not present

## 2021-07-27 DIAGNOSIS — M25562 Pain in left knee: Secondary | ICD-10-CM | POA: Diagnosis not present

## 2021-07-28 ENCOUNTER — Ambulatory Visit
Admission: RE | Admit: 2021-07-28 | Discharge: 2021-07-28 | Disposition: A | Discharge status: Home / Self Care | Payer: Medicare HMO | Source: Ambulatory Visit | Attending: Obstetrics & Gynecology | Admitting: Obstetrics & Gynecology

## 2021-07-28 DIAGNOSIS — Z1231 Encounter for screening mammogram for malignant neoplasm of breast: Secondary | ICD-10-CM

## 2021-07-29 DIAGNOSIS — M25562 Pain in left knee: Secondary | ICD-10-CM | POA: Diagnosis not present

## 2021-07-31 DIAGNOSIS — M25562 Pain in left knee: Secondary | ICD-10-CM | POA: Diagnosis not present

## 2021-08-03 DIAGNOSIS — M25562 Pain in left knee: Secondary | ICD-10-CM | POA: Diagnosis not present

## 2021-08-04 DIAGNOSIS — Z885 Allergy status to narcotic agent status: Secondary | ICD-10-CM | POA: Diagnosis not present

## 2021-08-04 DIAGNOSIS — Z7982 Long term (current) use of aspirin: Secondary | ICD-10-CM | POA: Diagnosis not present

## 2021-08-04 DIAGNOSIS — K208 Other esophagitis without bleeding: Secondary | ICD-10-CM | POA: Diagnosis not present

## 2021-08-04 DIAGNOSIS — K449 Diaphragmatic hernia without obstruction or gangrene: Secondary | ICD-10-CM | POA: Diagnosis not present

## 2021-08-04 DIAGNOSIS — K2100 Gastro-esophageal reflux disease with esophagitis, without bleeding: Secondary | ICD-10-CM | POA: Diagnosis not present

## 2021-08-04 DIAGNOSIS — I10 Essential (primary) hypertension: Secondary | ICD-10-CM | POA: Diagnosis not present

## 2021-08-04 DIAGNOSIS — Z87891 Personal history of nicotine dependence: Secondary | ICD-10-CM | POA: Diagnosis not present

## 2021-08-04 DIAGNOSIS — K222 Esophageal obstruction: Secondary | ICD-10-CM | POA: Diagnosis not present

## 2021-08-04 DIAGNOSIS — Z85118 Personal history of other malignant neoplasm of bronchus and lung: Secondary | ICD-10-CM | POA: Diagnosis not present

## 2021-08-04 DIAGNOSIS — K219 Gastro-esophageal reflux disease without esophagitis: Secondary | ICD-10-CM | POA: Diagnosis not present

## 2021-08-04 DIAGNOSIS — Z887 Allergy status to serum and vaccine status: Secondary | ICD-10-CM | POA: Diagnosis not present

## 2021-08-05 DIAGNOSIS — M25562 Pain in left knee: Secondary | ICD-10-CM | POA: Diagnosis not present

## 2021-08-07 DIAGNOSIS — M25562 Pain in left knee: Secondary | ICD-10-CM | POA: Diagnosis not present

## 2021-08-11 DIAGNOSIS — M25562 Pain in left knee: Secondary | ICD-10-CM | POA: Diagnosis not present

## 2021-08-14 DIAGNOSIS — M25562 Pain in left knee: Secondary | ICD-10-CM | POA: Diagnosis not present

## 2021-08-26 ENCOUNTER — Ambulatory Visit: Payer: Medicare HMO | Attending: Internal Medicine | Admitting: Physical Therapy

## 2021-08-26 ENCOUNTER — Other Ambulatory Visit: Payer: Self-pay

## 2021-08-26 ENCOUNTER — Encounter: Payer: Self-pay | Admitting: Physical Therapy

## 2021-08-26 DIAGNOSIS — R279 Unspecified lack of coordination: Secondary | ICD-10-CM | POA: Insufficient documentation

## 2021-08-26 DIAGNOSIS — N3281 Overactive bladder: Secondary | ICD-10-CM | POA: Insufficient documentation

## 2021-08-26 DIAGNOSIS — R252 Cramp and spasm: Secondary | ICD-10-CM | POA: Insufficient documentation

## 2021-08-26 DIAGNOSIS — M6281 Muscle weakness (generalized): Secondary | ICD-10-CM

## 2021-08-26 NOTE — Therapy (Signed)
[facility] @ [address] [place], [place], [address] Phone: [phone]   Fax:  [phone]  Physical Therapy Evaluation  Patient Details  Name: Marissa Jacobs MRN: [id number] Date of Birth: 04-27-1936 Referring Provider (PT): Hale Bogus MD   Encounter Date: 08/26/2021   PT End of Session - 08/26/21 1603     Visit Number 1    Number of Visits --    Date for PT Re-Evaluation 11/18/21    Authorization Type [employer] HMO/PPO    Authorization - Visit Number 1    Authorization - Number of Visits 10    Progress Note Due on Visit 10    PT Start Time 8527    PT Stop Time 1558    PT Time Calculation (min) 60 min    Activity Tolerance Other (comment)   Treatment limited by pt apprehension and concern for privacy; tolerated initial evaluation well.   Behavior During Therapy [facility] for tasks assessed/performed             Past Medical History:  Diagnosis Date   Anemia    secondary to taking omeprazole-on iron   Cancer ([place]) 08/2020   lung   Diverticulosis 09/2002   Dysrhythmia    a-fib after surgery 09/04/20. treated with Metoprolol for 6 week   GERD (gastroesophageal reflux disease)    Hyperlipidemia    Hypertension    Lung nodule    Osteopenia    Post traumatic stress disorder    STD (sexually transmitted disease)    HSV    Past Surgical History:  Procedure Laterality Date   APPENDECTOMY     CERVICAL BIOPSY  W/ LOOP ELECTRODE EXCISION     CIN1   COLPOSCOPY     ESOPHAGEAL DILATION  08/17/2011   FACIAL COSMETIC SURGERY  04/16/2002   KNEE SURGERY     LUNG REMOVAL, PARTIAL Left 08/2020   no chemo or radiation needed   TONSILLECTOMY     TOTAL KNEE ARTHROPLASTY Left 06/08/2021   Procedure: TOTAL KNEE ARTHROPLASTY;  Surgeon: Gaynelle Arabian, MD;  Location: [facility];  Service: Orthopedics;  Laterality: Left;    There were no vitals filed for this visit.    Subjective Assessment - 08/26/21 1459      Subjective Pt reports getting up 2-4x/night to urinate, small amounts each time she goes, for the last 5 years. She drinks ice tea, water, and occasional cocktail or glass of red wine. She reports using panty liners that sometimes will be wet throughout the day. She urinates frequently throughout the day - every hour or two. She denies any pelvic/lower abdominal pain.    Patient Stated Goals Decrease number of trips to the bathroom at night and throughout the day.    Currently in Pain? No/denies    Multiple Pain Sites No                [facility] PT Assessment - 08/26/21 0001       Assessment   Medical Diagnosis N32.81 - OAB    Referring Provider (PT) Hale Bogus MD    Onset Date/Surgical Date 08/16/16    Hand Dominance Right    Prior Therapy Yes   Not pelvic floor PT     Precautions   Precautions None      Restrictions   Weight Bearing Restrictions No      Balance Screen   Has the patient fallen in the past 6 months No  Has the patient had a decrease in activity level because of a fear of falling?  No    Is the patient reluctant to leave their home because of a fear of falling?  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   Leisure goes to the gym with personal trainer      Cognition   Overall Cognitive Status Within Functional Limits for tasks assessed      Functional Tests   Functional tests Single leg stance   negative for trendelenburg     Posture/Postural Control   Posture Comments Forward head posture, rounded shoulder, thoracic kyphosis      ROM / Strength   AROM / PROM / Strength AROM;Strength      AROM   AROM Assessment Site Lumbar;Hip;Knee    Right/Left Hip Right;Left    Right Hip External Rotation  40    Right Hip Internal Rotation  20    Left Hip External Rotation  28    Left Hip Internal Rotation  20    Left Knee  Flexion 110    Lumbar Extension decreased by 50%    Lumbar - Right Side Bend decreased by 25%    Lumbar - Left Side Bend decreased by 25%    Lumbar - Right Rotation decreased by 25%    Lumbar - Left Rotation decreased by 50%      Strength   Strength Assessment Site Hip    Right/Left Hip Right;Left    Right Hip Extension 4+/5    Right Hip ABduction 5/5    Right Hip ADduction 5/5    Left Hip Extension 5/5    Left Hip ABduction 4/5    Left Hip ADduction 4/5      Palpation   Palpation comment ASIS equal; rib angle >90 degrees                        Objective measurements completed on examination: See above findings.     Pelvic Floor Special Questions - 08/26/21 0001     Prior Pelvic/Prostate Exam Yes    Are you Pregnant or attempting pregnancy? No    Prior Pregnancies Yes    Number of Pregnancies 2    Number of Vaginal Deliveries 2   tearing   Currently Sexually Active Yes    Is this Painful No    Urinary Leakage Yes    How often throughout day    Pad use daily   panty liner   Activities that cause leaking Other    Other activities that cause leaking not sure    Urinary urgency Yes   Simultaneous filing. User may not have seen previous data.   Urinary frequency yes   Nocturia 2-4x/night   Fecal incontinence No    Fluid intake tea, water, alcohol    Caffeine beverages yes    Skin Integrity Intact    Perineal Body/Introitus  --   tight   Pelvic Floor Internal Exam Pt identity confirmed  and verbal consent given to assess pelvic floor and treatment    Exam Type Vaginal    Palpation tightness Bil urethral sphincter and sides of bladder; tightnss of the perineal body and along the introitus    Strength weak squeeze, no lift   sucks in breath, raising  lower rib cage   Tone high                       PT Education - 08/26/21 1602     Education Details Extensive pt education performed on pelvic floor treatment intervention and details of this  intervention in order to improve patient comfort with treatment.    Person(s) Educated Patient    Methods Explanation;Demonstration;Other (comment)   Pelvic model   Comprehension Verbalized understanding;Need further instruction              PT Short Term Goals - 08/26/21 1625       PT SHORT TERM GOAL #1   Title Pt will be indpendent with HEP in 2 weeks.    Time 2    Period Weeks    Status New    Target Date 09/09/21      PT SHORT TERM GOAL #2   Title Pt will demonstrate diaphragmatic breathing to move the diaphragm and elongate the pelvic floor.    Time 4    Period Weeks    Status New    Target Date 09/23/21      PT SHORT TERM GOAL #3   Title education of bladder irritants and how they affect the bladder and urgency    Time 4    Period Weeks    Status New    Target Date 09/23/21               PT Long Term Goals - 08/26/21 1628       PT LONG TERM GOAL #1   Title Pt will increase Bil hip IR/ER by 10 degrees and Lt hip strength to 5/5 in order to provide appropriate muscle tension/length relationship surrounding pelvic floor muscles.    Time 6    Period Weeks    Status New    Target Date 10/07/21      PT LONG TERM GOAL #2   Title Pt will demonstrate 3/5 pelvic floor strength with appropriate lengthening in order to decrease urinary urgency and nocturia in 8 weeks.    Time 8    Period Weeks    Status New    Target Date 10/21/21      PT LONG TERM GOAL #3   Title Pt will report decreased nocturia to 1-2x/night in order to get better rest in 8 weeks.    Baseline ----    Time 8    Period Weeks    Status New    Target Date 10/21/21      PT LONG TERM GOAL #4   Title ------                    Plan - 08/26/21 1606     Clinical Impression Statement Pt is an 86 year old female with chief complaint of nocturia 2-4x/night and urinary urgency/frequency throughout the day for the last 5 years. Exam findings notable for Lt hip weakness  (abduction/adduction/extension 4/5), decreased Bil hip A/ROM (Lt hip IR 20, Lt hip ER 28, and Rt hip IR 20), decreased lumbar A/ROM into all directions, high tone pelvic floor muscles with most notable tightness in urehtral spincters, and pelvic floor weakness 2/5. Signs and symptoms most consistent with high tone pelvic floor that is causing urgency/frequency and weakness in pelvic floor muscles. She will benefit from skilled PT intervention in order to adress impairments, improve nocturia, and decrease daily urinary urgency/frequency.    Personal Factors and  Comorbidities Age;Behavior Pattern;Comorbidity 1;Comorbidity 2;Comorbidity 3+;Past/Current Experience;Other   Willingness to participate in treatment   Comorbidities Lt TKA, diverticulosis, previous abdominal surgery (appendectomy), history of lung cancer/partial lung removal    Examination-Activity Limitations Continence;Sleep    Examination-Participation Restrictions Community Activity    Stability/Clinical Decision Making Evolving/Moderate complexity    Clinical Decision Making Moderate    Rehab Potential Fair    PT Frequency 1x / week   every other week   PT Duration 12 weeks    PT Treatment/Interventions ADLs/Self Care Home Management;Biofeedback;Cryotherapy;Electrical Stimulation;Moist Heat;Neuromuscular re-education;Therapeutic exercise;Therapeutic activities;Patient/family education;Manual techniques;Dry needling;Scar mobilization;Passive range of motion;Spinal Manipulations    PT Next Visit Plan Begin pelvic floor lengthening training, healthy bladder habits; internal pelvic manual work to the urethra and side of the bladder, perineal body, and along the introitus; bulging of the pelvic floor, work on rib cage mobility to reduce angle, diaphragmatic breahting    PT Home Exercise Plan none given this session    Consulted and Agree with Plan of Care Patient             Patient will benefit from skilled therapeutic intervention in  order to improve the following deficits and impairments:  Decreased activity tolerance, Decreased balance, Decreased mobility, Decreased strength, Decreased scar mobility, Decreased coordination, Decreased range of motion, Decreased endurance, Increased fascial restricitons, Increased muscle spasms, Pain, Hypomobility, Postural dysfunction  Visit Diagnosis: Cramp and spasm - Plan: PT plan of care cert/re-cert  Muscle weakness (generalized) - Plan: PT plan of care cert/re-cert  Unspecified lack of coordination - Plan: PT plan of care cert/re-cert     Problem List Patient Active Problem List   Diagnosis Date Noted   Primary osteoarthritis of left knee 06/08/2021   Pulmonary nodules/lesions, multiple 05/05/2020   Bilateral hearing loss 05/29/2019   Osteoarthritis of knee 06/27/2018   Atypical chest pain 05/02/2018   Hypertensive disorder 05/02/2018   GERD with stricture 11/01/2014   Hyperlipidemia 07/28/2012    Heather Roberts, PT, DPT01/11/235:02 PM  Earlie Counts, PT 08/26/21 5:02 PM   [facility] @ [address] [place], [place], [address] Phone: [phone]   Fax:  [phone]  Name: Marissa Jacobs MRN: [id number] Date of Birth: September 26, 1935

## 2021-08-26 NOTE — Patient Instructions (Signed)

## 2021-09-13 DIAGNOSIS — M8580 Other specified disorders of bone density and structure, unspecified site: Secondary | ICD-10-CM | POA: Diagnosis not present

## 2021-09-13 DIAGNOSIS — E785 Hyperlipidemia, unspecified: Secondary | ICD-10-CM | POA: Diagnosis not present

## 2021-09-13 DIAGNOSIS — I700 Atherosclerosis of aorta: Secondary | ICD-10-CM | POA: Diagnosis not present

## 2021-09-13 DIAGNOSIS — I10 Essential (primary) hypertension: Secondary | ICD-10-CM | POA: Diagnosis not present

## 2021-09-23 DIAGNOSIS — L570 Actinic keratosis: Secondary | ICD-10-CM | POA: Diagnosis not present

## 2021-09-23 DIAGNOSIS — L814 Other melanin hyperpigmentation: Secondary | ICD-10-CM | POA: Diagnosis not present

## 2021-09-23 DIAGNOSIS — D1801 Hemangioma of skin and subcutaneous tissue: Secondary | ICD-10-CM | POA: Diagnosis not present

## 2021-09-23 DIAGNOSIS — L821 Other seborrheic keratosis: Secondary | ICD-10-CM | POA: Diagnosis not present

## 2021-09-23 DIAGNOSIS — D225 Melanocytic nevi of trunk: Secondary | ICD-10-CM | POA: Diagnosis not present

## 2021-09-23 DIAGNOSIS — I788 Other diseases of capillaries: Secondary | ICD-10-CM | POA: Diagnosis not present

## 2021-10-14 ENCOUNTER — Encounter: Payer: Medicare HMO | Admitting: Physical Therapy

## 2021-10-28 ENCOUNTER — Encounter: Payer: Medicare HMO | Admitting: Physical Therapy

## 2021-10-29 DIAGNOSIS — Z96652 Presence of left artificial knee joint: Secondary | ICD-10-CM | POA: Diagnosis not present

## 2021-11-11 ENCOUNTER — Encounter: Payer: Medicare HMO | Admitting: Physical Therapy

## 2021-11-17 DIAGNOSIS — E041 Nontoxic single thyroid nodule: Secondary | ICD-10-CM | POA: Diagnosis not present

## 2021-11-17 DIAGNOSIS — I10 Essential (primary) hypertension: Secondary | ICD-10-CM | POA: Diagnosis not present

## 2021-11-17 DIAGNOSIS — I700 Atherosclerosis of aorta: Secondary | ICD-10-CM | POA: Diagnosis not present

## 2021-11-17 DIAGNOSIS — M8580 Other specified disorders of bone density and structure, unspecified site: Secondary | ICD-10-CM | POA: Diagnosis not present

## 2021-11-17 DIAGNOSIS — R69 Illness, unspecified: Secondary | ICD-10-CM | POA: Diagnosis not present

## 2021-11-17 DIAGNOSIS — E785 Hyperlipidemia, unspecified: Secondary | ICD-10-CM | POA: Diagnosis not present

## 2021-11-17 DIAGNOSIS — E663 Overweight: Secondary | ICD-10-CM | POA: Diagnosis not present

## 2021-11-17 DIAGNOSIS — I2584 Coronary atherosclerosis due to calcified coronary lesion: Secondary | ICD-10-CM | POA: Diagnosis not present

## 2021-11-17 DIAGNOSIS — C3492 Malignant neoplasm of unspecified part of left bronchus or lung: Secondary | ICD-10-CM | POA: Diagnosis not present

## 2021-11-17 DIAGNOSIS — J439 Emphysema, unspecified: Secondary | ICD-10-CM | POA: Diagnosis not present

## 2021-11-25 ENCOUNTER — Ambulatory Visit: Payer: Medicare HMO | Admitting: Physical Therapy

## 2021-12-17 DIAGNOSIS — Z961 Presence of intraocular lens: Secondary | ICD-10-CM | POA: Diagnosis not present

## 2021-12-17 DIAGNOSIS — H5211 Myopia, right eye: Secondary | ICD-10-CM | POA: Diagnosis not present

## 2021-12-29 DIAGNOSIS — C3492 Malignant neoplasm of unspecified part of left bronchus or lung: Secondary | ICD-10-CM | POA: Diagnosis not present

## 2021-12-29 DIAGNOSIS — R918 Other nonspecific abnormal finding of lung field: Secondary | ICD-10-CM | POA: Diagnosis not present

## 2021-12-29 DIAGNOSIS — R911 Solitary pulmonary nodule: Secondary | ICD-10-CM | POA: Diagnosis not present

## 2022-01-05 DIAGNOSIS — C3492 Malignant neoplasm of unspecified part of left bronchus or lung: Secondary | ICD-10-CM | POA: Diagnosis not present

## 2022-03-12 ENCOUNTER — Institutional Professional Consult (permissible substitution): Payer: Medicare HMO | Admitting: Pulmonary Disease

## 2022-03-24 ENCOUNTER — Encounter: Payer: Self-pay | Admitting: Pulmonary Disease

## 2022-03-24 ENCOUNTER — Ambulatory Visit: Payer: Medicare HMO | Admitting: Pulmonary Disease

## 2022-03-24 VITALS — BP 110/66 | HR 68 | Ht 61.0 in | Wt 138.8 lb

## 2022-03-24 DIAGNOSIS — Z85118 Personal history of other malignant neoplasm of bronchus and lung: Secondary | ICD-10-CM

## 2022-03-24 DIAGNOSIS — R918 Other nonspecific abnormal finding of lung field: Secondary | ICD-10-CM

## 2022-03-24 DIAGNOSIS — Z789 Other specified health status: Secondary | ICD-10-CM | POA: Diagnosis not present

## 2022-03-24 DIAGNOSIS — R911 Solitary pulmonary nodule: Secondary | ICD-10-CM

## 2022-03-24 NOTE — Progress Notes (Signed)
Synopsis: Referred in August 2023 for lung mass by Shon Baton, MD  Subjective:   PATIENT ID: Marissa Jacobs GENDER: female DOB: 1936-05-19, MRN: [id number]  Chief Complaint  Patient presents with   Consult    Consult: Lung nodule in right lower left lung.    This is an 86 year oldFemale, past medical history of lung cancer in January 2022, hypertension, hyperlipidemia. Patient was referred for evaluation of lung mass.  Patient had consultation on 01/05/2022 with surgical oncology by Dr. Laverta [place] at [facility].  She has a history of a stage Ia 3 lung adenocarcinoma status post robotic assisted left upper lobe posterior segmentectomy in January 2022.  She recently had CT imaging that was complete in May 2023.  Increased size and a groundglass nodule in the posterior superior segment of the right lower lobe measuring 2.1 cm concerning for carcinoma in situ versus minimally invasive adeno.  It slowly been growing in size.  Recommending short-term CT follow-up.  Patient was referred after having this short-term CT follow-up for evaluation of the groundglass nodule by Dr. Shon Baton.  Here today to discuss next steps and second opinion on nodule. She has appt with Dr. Laverta [facility] at [facility] in September atfter ct chest follow up.     Past Medical History:  Diagnosis Date   Anemia    secondary to taking omeprazole-on iron   Cancer ([place]) 08/2020   lung   Diverticulosis 09/2002   Dysrhythmia    a-fib after surgery 09/04/20. treated with Metoprolol for 6 week   GERD (gastroesophageal reflux disease)    Hyperlipidemia    Hypertension    Lung nodule    Osteopenia    Post traumatic stress disorder    STD (sexually transmitted disease)    HSV     Family History  Problem Relation Age of Onset   COPD Mother    Sudden death Father 43   Healthy Brother      Past Surgical History:  Procedure Laterality Date   APPENDECTOMY     CERVICAL BIOPSY  W/ LOOP ELECTRODE EXCISION     CIN1   COLPOSCOPY     ESOPHAGEAL  DILATION  08/17/2011   FACIAL COSMETIC SURGERY  04/16/2002   KNEE SURGERY     LUNG REMOVAL, PARTIAL Left 08/2020   no chemo or radiation needed   TONSILLECTOMY     TOTAL KNEE ARTHROPLASTY Left 06/08/2021   Procedure: TOTAL KNEE ARTHROPLASTY;  Surgeon: Gaynelle Arabian, MD;  Location: [facility];  Service: Orthopedics;  Laterality: Left;    Social History   Socioeconomic History   Marital status: Divorced    Spouse name: Not on file   Number of children: Not on file   Years of education: Not on file   Highest education level: Not on file  Occupational History   Not on file  Tobacco Use   Smoking status: Former    Types: Cigarettes    Quit date: 02/21/1963    Years since quitting: 59.1   Smokeless tobacco: Never   Tobacco comments:    in college  Vaping Use   Vaping Use: Never used  Substance and Sexual Activity   Alcohol use: Yes    Alcohol/week: 5.0 standard drinks of alcohol    Types: 5 Standard drinks or equivalent per week   Drug use: No   Sexual activity: Yes    Partners: Male    Birth control/protection: Post-menopausal  Other Topics Concern   Not on file  Social History Narrative  Not on file   Social Determinants of Health   Financial Resource Strain: Not on file  Food Insecurity: Not on file  Transportation Needs: Not on file  Physical Activity: Not on file  Stress: Not on file  Social Connections: Not on file  Intimate Partner Violence: Not on file     Allergies  Allergen Reactions   Tramadol Other (See Comments)   Abobotulinumtoxina Rash    Something like Botox   Codeine Hives   Other     Pt went into AFIB after having a procedure on 09/05/20   Shellfish Allergy Nausea And Vomiting    Crab Meat - nausea and vomiting    Amoxicillin Itching and Rash     Outpatient Medications Prior to Visit  Medication Sig Dispense Refill   fexofenadine (ALLEGRA) 180 MG tablet Take 180 mg by mouth daily as needed for allergies.     Brimonidine Tartrate (LUMIFY)  0.025 % SOLN Place 1 drop into both eyes daily.     EPIPEN 2-PAK 0.3 MG/0.3ML SOAJ injection Inject 0.3 mg into the muscle as needed for anaphylaxis [employer] fast streamer).     estradiol (VIVELLE-DOT) 0.025 MG/24HR Place 1 patch onto the skin 2 (two) times a week. 24 patch 4   hydrochlorothiazide (MICROZIDE) 12.5 MG capsule Take 12.5 mg by mouth daily.     HYDROmorphone (DILAUDID) 2 MG tablet Take 1-2 tablets (2-4 mg total) by mouth every 6 (six) hours as needed for severe pain. (Patient not taking: Reported on 07/13/2021) 42 tablet 0   losartan (COZAAR) 50 MG tablet Take 1 tablet (50 mg total) by mouth daily. 90 tablet 3   methocarbamol (ROBAXIN) 500 MG tablet Take 1 tablet (500 mg total) by mouth every 6 (six) hours as needed for muscle spasms. 40 tablet 0   nitroGLYCERIN (NITROSTAT) 0.4 MG SL tablet Place 1 tablet (0.4 mg total) under the tongue every 5 (five) minutes as needed for chest pain. 30 tablet 0   omeprazole (PRILOSEC) 10 MG capsule Take 10 mg by mouth daily as needed (acid reflux).  11   progesterone (PROMETRIUM) 100 MG capsule Take 1 capsule (100 mg total) by mouth daily. 30 capsule 6   traMADol (ULTRAM) 50 MG tablet Take 1-2 tablets (50-100 mg total) by mouth every 6 (six) hours as needed for moderate pain. (Patient not taking: Reported on 07/13/2021) 40 tablet 0   tretinoin (RETIN-A) 0.025 % cream Apply 1 application topically at bedtime.      No facility-administered medications prior to visit.    Review of Systems  Constitutional:  Negative for chills, fever, malaise/fatigue and weight loss.  HENT:  Negative for hearing loss, sore throat and tinnitus.   Eyes:  Negative for blurred vision and double vision.  Respiratory:  Negative for cough, hemoptysis, sputum production, shortness of breath, wheezing and stridor.   Cardiovascular:  Negative for chest pain, palpitations, orthopnea, leg swelling and PND.  Gastrointestinal:  Negative for abdominal pain, constipation, diarrhea, heartburn,  nausea and vomiting.  Genitourinary:  Negative for dysuria, hematuria and urgency.  Musculoskeletal:  Negative for joint pain and myalgias.  Skin:  Negative for itching and rash.  Neurological:  Negative for dizziness, tingling, weakness and headaches.  Endo/Heme/Allergies:  Negative for environmental allergies. Does not bruise/bleed easily.  Psychiatric/Behavioral:  Negative for depression. The patient is not nervous/anxious and does not have insomnia.   All other systems reviewed and are negative.    Objective:  Physical Exam Vitals reviewed.  Constitutional:  General: She is not in acute distress.    Appearance: She is well-developed.  HENT:     Head: Normocephalic and atraumatic.  Eyes:     General: No scleral icterus.    Conjunctiva/sclera: Conjunctivae normal.     Pupils: Pupils are equal, round, and reactive to light.  Neck:     Vascular: No JVD.     Trachea: No tracheal deviation.  Cardiovascular:     Rate and Rhythm: Normal rate and regular rhythm.     Heart sounds: Normal heart sounds. No murmur heard. Pulmonary:     Effort: Pulmonary effort is normal. No tachypnea, accessory muscle usage or respiratory distress.     Breath sounds: No stridor. No wheezing, rhonchi or rales.  Abdominal:     General: There is no distension.     Palpations: Abdomen is soft.     Tenderness: There is no abdominal tenderness.  Musculoskeletal:        General: No tenderness.     Cervical back: Neck supple.  Lymphadenopathy:     Cervical: No cervical adenopathy.  Skin:    General: Skin is warm and dry.     Capillary Refill: Capillary refill takes less than 2 seconds.     Findings: No rash.  Neurological:     Mental Status: She is alert and oriented to person, place, and time.  Psychiatric:        Behavior: Behavior normal.      Vitals:   03/24/22 1444  BP: 110/66  Pulse: 68  SpO2: 96%  Weight: 138 lb 12.8 oz (63 kg)  Height: 5\' 1"  (1.549 m)   96% on RA BMI Readings  from Last 3 Encounters:  03/24/22 26.23 kg/m  07/13/21 26.99 kg/m  06/08/21 26.45 kg/m   Wt Readings from Last 3 Encounters:  03/24/22 138 lb 12.8 oz (63 kg)  07/13/21 138 lb 3.2 oz (62.7 kg)  06/08/21 140 lb (63.5 kg)     CBC    Component Value Date/Time   WBC 10.2 06/10/2021 0317   RBC 3.34 (L) 06/10/2021 0317   HGB 10.0 (L) 06/10/2021 0317   HCT 30.2 (L) 06/10/2021 0317   PLT 242 06/10/2021 0317   MCV 90.4 06/10/2021 0317   MCH 29.9 06/10/2021 0317   MCHC 33.1 06/10/2021 0317   RDW 13.7 06/10/2021 0317   LYMPHSABS 1.6 03/11/2021 2056   MONOABS 0.7 03/11/2021 2056   EOSABS 0.2 03/11/2021 2056   BASOSABS 0.0 03/11/2021 2056     Chest Imaging: May 2023 CT Chest:  Ground glass lesion in the Right lower lobe.  The patient's images have been independently reviewed by me.     Pulmonary Functions Testing Results:     No data to display          FeNO:   Pathology:   Echocardiogram:   Heart Catheterization:     Assessment & Plan:     ICD-10-CM   1. Ground glass opacity present on imaging of lung  R91.8     2. Pulmonary nodule  R91.1     3. Non-smoker  Z78.9     4. History of lung cancer  Z85.118       Discussion:  86 yo FM, RLL ground glass nodule dating back to 2017. I have reviewed the CT imaging. H/o stage 1 lung cancer s/p resection.     P: Follow up CT imaging planned at [facility] with Dr. Laverta [place] in September.  She is going to have follow  up imaging  Pending on the imaging I explained that we are happy to offer biopsy with RAB+CBCT here at [facility]. She can also follow up with my colleague Dr. Alveria Apley also at [facility].   I spent additional time on the phone with the patients son to discuss the ct results and next steps as well.    Current Outpatient Medications:    fexofenadine (ALLEGRA) 180 MG tablet, Take 180 mg by mouth daily as needed for allergies., Disp: , Rfl:    Brimonidine Tartrate (LUMIFY) 0.025 % SOLN, Place 1 drop into both eyes daily.,  Disp: , Rfl:    EPIPEN 2-PAK 0.3 MG/0.3ML SOAJ injection, Inject 0.3 mg into the muscle as needed for anaphylaxis [employer] fast streamer)., Disp: , Rfl:    estradiol (VIVELLE-DOT) 0.025 MG/24HR, Place 1 patch onto the skin 2 (two) times a week., Disp: 24 patch, Rfl: 4   hydrochlorothiazide (MICROZIDE) 12.5 MG capsule, Take 12.5 mg by mouth daily., Disp: , Rfl:    HYDROmorphone (DILAUDID) 2 MG tablet, Take 1-2 tablets (2-4 mg total) by mouth every 6 (six) hours as needed for severe pain. (Patient not taking: Reported on 07/13/2021), Disp: 42 tablet, Rfl: 0   losartan (COZAAR) 50 MG tablet, Take 1 tablet (50 mg total) by mouth daily., Disp: 90 tablet, Rfl: 3   methocarbamol (ROBAXIN) 500 MG tablet, Take 1 tablet (500 mg total) by mouth every 6 (six) hours as needed for muscle spasms., Disp: 40 tablet, Rfl: 0   nitroGLYCERIN (NITROSTAT) 0.4 MG SL tablet, Place 1 tablet (0.4 mg total) under the tongue every 5 (five) minutes as needed for chest pain., Disp: 30 tablet, Rfl: 0   omeprazole (PRILOSEC) 10 MG capsule, Take 10 mg by mouth daily as needed (acid reflux)., Disp: , Rfl: 11   progesterone (PROMETRIUM) 100 MG capsule, Take 1 capsule (100 mg total) by mouth daily., Disp: 30 capsule, Rfl: 6   traMADol (ULTRAM) 50 MG tablet, Take 1-2 tablets (50-100 mg total) by mouth every 6 (six) hours as needed for moderate pain. (Patient not taking: Reported on 07/13/2021), Disp: 40 tablet, Rfl: 0   tretinoin (RETIN-A) 0.025 % cream, Apply 1 application topically at bedtime. , Disp: , Rfl:   I spent 63 minutes dedicated to the care of this patient on the date of this encounter to include pre-visit review of records, face-to-face time with the patient discussing conditions above, post visit ordering of testing, clinical documentation with the electronic health record, making appropriate referrals as documented, and communicating necessary findings to members of the patients care team.   Garner Nash, [facility] 03/24/2022 5:14 PM

## 2022-03-24 NOTE — Patient Instructions (Signed)
Thank you for visiting Dr. Valeta Harms at [facility]. Today we recommend the following:  Follow up with [place] after seeing Dr. Laverta [place]   Return in about 2 months (around 05/24/2022).    Please do your part to reduce the spread of COVID-19.

## 2022-04-30 ENCOUNTER — Telehealth: Payer: Self-pay

## 2022-04-30 NOTE — Patient Outreach (Signed)
  Care Coordination   04/30/2022 Name: Marissa Jacobs MRN: [id number] DOB: 18-Aug-1935   Care Coordination Outreach Attempts:  An unsuccessful telephone outreach was attempted today to offer the patient information about available care coordination services as a benefit of their health plan.   Follow Up Plan:  Additional outreach attempts will be made to offer the patient care coordination information and services.   Encounter Outcome:  No Answer  Care Coordination Interventions Activated:  No   Care Coordination Interventions:  No, not indicated      Enzo Montgomery, RN,BSN,CCM [facility] Telephonic Care Management Coordinator Direct Phone: [phone] Toll Free: [phone] Fax: [phone]

## 2022-05-02 NOTE — Progress Notes (Unsigned)
Cardiology Office Note:    Date:  05/03/2022   ID:  Marissa Jacobs, DOB 10-09-1935, MRN [id number]  PCP:  Marissa Baton, MD  Cardiologist:  Marissa Grooms, MD   Referring MD: Marissa Baton, MD   Chief Complaint  Patient presents with   Atrial Fibrillation   Coronary Artery Disease   Hypertension   Follow-up    New right bundle branch block    History of Present Illness:    Marissa Jacobs is a 86 y.o. female with a hx of primary HTN, lung cancer s/p left upper lobe posterior segmentectomy (January 2022 @ [facility]), CAD (non-obstructive by CTA), post operative atrial fibrillation, hyperlipidemia, and primary hypertension.   Marissa Jacobs just returned to [place] this weekend from a 10-day [place] trip to [place].  She walked greater than 32 miles while gone for the 10 days.  She was tired each day because there was a lot of walking with hills involved.  The walking did not produce any symptoms of chest discomfort, orthopnea, or leg pain.  She still has occasional episodes of chest discomfort that occur randomly and can last up to 5 minutes.  These episodes always resolve spontaneously.  There is some nausea associated.  Past Medical History:  Diagnosis Date   Anemia    secondary to taking omeprazole-on iron   Cancer ([place]) 08/2020   lung   Diverticulosis 09/2002   Dysrhythmia    a-fib after surgery 09/04/20. treated with Metoprolol for 6 week   GERD (gastroesophageal reflux disease)    Hyperlipidemia    Hypertension    Lung nodule    Osteopenia    Post traumatic stress disorder    STD (sexually transmitted disease)    HSV    Past Surgical History:  Procedure Laterality Date   APPENDECTOMY     CERVICAL BIOPSY  W/ LOOP ELECTRODE EXCISION     CIN1   COLPOSCOPY     ESOPHAGEAL DILATION  08/17/2011   FACIAL COSMETIC SURGERY  04/16/2002   KNEE SURGERY     LUNG REMOVAL, PARTIAL Left 08/2020   no chemo or radiation needed   TONSILLECTOMY     TOTAL KNEE ARTHROPLASTY  Left 06/08/2021   Procedure: TOTAL KNEE ARTHROPLASTY;  Surgeon: Gaynelle Arabian, MD;  Location: [facility];  Service: Orthopedics;  Laterality: Left;    Current Medications: Current Meds  Medication Sig   Cholecalciferol 125 MCG (5000 UT) TABS Take 1 tablet by mouth daily.   EPIPEN 2-PAK 0.3 MG/0.3ML SOAJ injection Inject 0.3 mg into the muscle as needed for anaphylaxis [employer] fast streamer).   estradiol (VIVELLE-DOT) 0.025 MG/24HR Place 1 patch onto the skin 2 (two) times a week.   fexofenadine (ALLEGRA) 180 MG tablet Take 180 mg by mouth daily as needed for allergies.   hydrochlorothiazide (MICROZIDE) 12.5 MG capsule Take 12.5 mg by mouth daily.   losartan (COZAAR) 50 MG tablet Take 1 tablet (50 mg total) by mouth daily.   nitroGLYCERIN (NITROSTAT) 0.4 MG SL tablet Place 1 tablet (0.4 mg total) under the tongue every 5 (five) minutes as needed for chest pain.   Omega-3 1000 MG CAPS Take 1 capsule by mouth daily at 6 (six) AM.   omeprazole (PRILOSEC) 10 MG capsule Take 10 mg by mouth daily as needed (acid reflux).   tretinoin (RETIN-A) 0.025 % cream Apply 1 application topically at bedtime.      Allergies:   Tramadol, Abobotulinumtoxina, Codeine, Other, Shellfish allergy, and Amoxicillin   Social History  Socioeconomic History   Marital status: Divorced    Spouse name: Not on file   Number of children: Not on file   Years of education: Not on file   Highest education level: Not on file  Occupational History   Not on file  Tobacco Use   Smoking status: Former    Types: Cigarettes    Quit date: 02/21/1963    Years since quitting: 59.2   Smokeless tobacco: Never   Tobacco comments:    in college  Vaping Use   Vaping Use: Never used  Substance and Sexual Activity   Alcohol use: Yes    Alcohol/week: 5.0 standard drinks of alcohol    Types: 5 Standard drinks or equivalent per week   Drug use: No   Sexual activity: Yes    Partners: Male    Birth control/protection: Post-menopausal  Other  Topics Concern   Not on file  Social History Narrative   Not on file   Social Determinants of Health   Financial Resource Strain: Not on file  Food Insecurity: Not on file  Transportation Needs: Not on file  Physical Activity: Not on file  Stress: Not on file  Social Connections: Not on file     Family History: The patient's family history includes COPD in her mother; Healthy in her brother; Sudden death (age of onset: 10) in her father.  ROS:   Please see the history of present illness.    She does not want to be on much in the way of medication.  She has avoided statin therapy and prefers red yeast rice.  She drinks a glass of red wine 3 times per week.  All other systems reviewed and are negative.  EKGs/Labs/Other Studies Reviewed:    The following studies were reviewed today: No new studies  EKG:  EKG normal sinus rhythm, right bundle, left atrial abnormality.  When compared to March 12, 2021, right bundle branch block is new.  Recent Labs: 05/26/2021: ALT 20 06/10/2021: BUN 14; Creatinine, Ser 0.63; Hemoglobin 10.0; Platelets 242; Potassium 3.8; Sodium 131  Recent Lipid Panel No results found for: "CHOL", "TRIG", "HDL", "CHOLHDL", "VLDL", "LDLCALC", "LDLDIRECT"  Physical Exam:    VS:  BP 122/62   Pulse 70   Ht 5\' 1"  (1.549 m)   Wt 139 lb 6.4 oz (63.2 kg)   LMP 08/16/1993   SpO2 96%   BMI 26.34 kg/m     Wt Readings from Last 3 Encounters:  05/03/22 139 lb 6.4 oz (63.2 kg)  03/24/22 138 lb 12.8 oz (63 kg)  07/13/21 138 lb 3.2 oz (62.7 kg)     GEN: Appears younger than the stated age. No acute distress HEENT: Normal NECK: No JVD. LYMPHATICS: No lymphadenopathy CARDIAC: No murmur. RRR no gallop, or edema. VASCULAR:  Normal Pulses. No bruits. RESPIRATORY:  Clear to auscultation without rales, wheezing or rhonchi  ABDOMEN: Soft, non-tender, non-distended, No pulsatile mass, MUSCULOSKELETAL: No deformity  SKIN: Warm and dry NEUROLOGIC:  Alert and oriented x  3 PSYCHIATRIC:  Normal affect   ASSESSMENT:    1. PAF (paroxysmal atrial fibrillation) ([place])   2. Essential hypertension   3. CAD in native artery   4. Mixed hyperlipidemia   5. Right bundle branch block    PLAN:    In order of problems listed above:  No instances of prolonged palpitation.  Her instance of atrial fibrillation was provoked following lung surgery. Blood pressure control is excellent on current therapy which includes losartan and  Microzide. Continue secondary prevention.  Most recent LDL cholesterol 105 in October 2022.  Would not force the issue on statin therapy at her age. Low-fat diet, Mediterranean style. We discussed the implications.  There is no action item given her level of physical activity without any symptoms to suggest bradycardia or AV block.  Plan clinical observation.  Discussed this so that there would be awareness of the abnormality in case she ends up in any emergency room .   Overall education and awareness concerning primary/secondary risk prevention was discussed in detail: LDL less than 70, hemoglobin A1c less than 7, blood pressure target less than 130/80 mmHg, >150 minutes of moderate aerobic activity per week, avoidance of smoking, weight control (via diet and exercise), and continued surveillance/management of/for obstructive sleep apnea.   He is a 1 year follow-up with a newly assigned cardiologist.   Medication Adjustments/Labs and Tests Ordered: Current medicines are reviewed at length with the patient today.  Concerns regarding medicines are outlined above.  Orders Placed This Encounter  Procedures   EKG 12-Lead   No orders of the defined types were placed in this encounter.   Patient Instructions  Medication Instructions:  Your physician recommends that you continue on your current medications as directed. Please refer to the Current Medication list given to you today.  *If you need a refill on your cardiac medications before  your next appointment, please call your pharmacy*  Lab Work: NONE  Testing/Procedures: NONE  Follow-Up: At [facility], you and your health needs are our priority.  As part of our continuing mission to provide you with exceptional heart care, we have created designated Provider Care Teams.  These Care Teams include your primary Cardiologist (physician) and Advanced Practice Providers (APPs -  Physician Assistants and Nurse Practitioners) who all work together to provide you with the care you need, when you need it.  Your next appointment:   1 year(s)  The format for your next appointment:   In Person  Provider:   Freada Bergeron, MD   Important Information About Sugar         Signed, Marissa Grooms, MD  05/03/2022 5:36 PM    [place]

## 2022-05-03 ENCOUNTER — Encounter: Payer: Self-pay | Admitting: Interventional Cardiology

## 2022-05-03 ENCOUNTER — Ambulatory Visit: Payer: Medicare HMO | Attending: Interventional Cardiology | Admitting: Interventional Cardiology

## 2022-05-03 VITALS — BP 122/62 | HR 70 | Ht 61.0 in | Wt 139.4 lb

## 2022-05-03 DIAGNOSIS — E782 Mixed hyperlipidemia: Secondary | ICD-10-CM

## 2022-05-03 DIAGNOSIS — I10 Essential (primary) hypertension: Secondary | ICD-10-CM | POA: Diagnosis not present

## 2022-05-03 DIAGNOSIS — I480 Paroxysmal atrial fibrillation: Secondary | ICD-10-CM | POA: Diagnosis not present

## 2022-05-03 DIAGNOSIS — I2510 Atherosclerotic heart disease of native coronary artery without angina pectoris: Secondary | ICD-10-CM

## 2022-05-03 DIAGNOSIS — I4510 Unspecified right bundle-branch block: Secondary | ICD-10-CM | POA: Diagnosis not present

## 2022-05-03 DIAGNOSIS — E781 Pure hyperglyceridemia: Secondary | ICD-10-CM

## 2022-05-03 NOTE — Patient Instructions (Signed)
Medication Instructions:  Your physician recommends that you continue on your current medications as directed. Please refer to the Current Medication list given to you today.  *If you need a refill on your cardiac medications before your next appointment, please call your pharmacy*  Lab Work: NONE  Testing/Procedures: NONE  Follow-Up: At [facility], you and your health needs are our priority.  As part of our continuing mission to provide you with exceptional heart care, we have created designated Provider Care Teams.  These Care Teams include your primary Cardiologist (physician) and Advanced Practice Providers (APPs -  Physician Assistants and Nurse Practitioners) who all work together to provide you with the care you need, when you need it.  Your next appointment:   1 year(s)  The format for your next appointment:   In Person  Provider:   Freada Bergeron, MD   Important Information About Sugar

## 2022-05-05 DIAGNOSIS — C3492 Malignant neoplasm of unspecified part of left bronchus or lung: Secondary | ICD-10-CM | POA: Diagnosis not present

## 2022-05-05 DIAGNOSIS — I10 Essential (primary) hypertension: Secondary | ICD-10-CM | POA: Diagnosis not present

## 2022-05-05 DIAGNOSIS — U071 COVID-19: Secondary | ICD-10-CM | POA: Diagnosis not present

## 2022-05-05 DIAGNOSIS — R051 Acute cough: Secondary | ICD-10-CM | POA: Diagnosis not present

## 2022-05-05 DIAGNOSIS — J439 Emphysema, unspecified: Secondary | ICD-10-CM | POA: Diagnosis not present

## 2022-05-06 ENCOUNTER — Telehealth: Payer: Self-pay

## 2022-05-06 NOTE — Patient Outreach (Signed)
  Care Coordination   Initial Visit Note   05/06/2022 Name: JIM PHILEMON MRN: [id number] DOB: [birth year]  SUHAYLAH WAMPOLE is a 86 y.o. year old female who sees Shon Baton, MD for primary care. I spoke with  [name] by phone today.  What matters to the patients health and wellness today?  Patient reports she was on recent 10 hr flight back from [place] and caught COVID. She saw PCP yesterday and has been placed on meds. She is having some diarrhea from anti-viral med-encouraged to stay hydrated. She plans to contact PCP office back to discuss her sxs. She denies any RN CM needs or concerns at this time and declined services.    Goals Addressed             This Visit's Progress    COMPLETED: Care Coordination-no follow up required       Care Coordination Interventions: Advised patient to seek medical attention for any worsening sxs, discussed mgmt of COVID in the home, importance of hydration with onset on diarrhea  Provided education to patient re: AWV, immunizations-pt allergic to flu vaccine Assessed social determinant of health barriers         SDOH assessments and interventions completed:  Yes  SDOH Interventions Today    Flowsheet Row Most Recent Value  SDOH Interventions   Food Insecurity Interventions Intervention Not Indicated  Transportation Interventions Intervention Not Indicated        Care Coordination Interventions Activated:  Yes  Care Coordination Interventions:  Yes, provided   Follow up plan: No further intervention required.   Encounter Outcome:  Pt. Visit Completed   Enzo Montgomery, RN,BSN,CCM [place] [facility] Telephonic Care Management Coordinator Direct Phone: [phone] Toll Free: [phone] Fax: [phone]

## 2022-05-11 DIAGNOSIS — C3412 Malignant neoplasm of upper lobe, left bronchus or lung: Secondary | ICD-10-CM | POA: Diagnosis not present

## 2022-05-11 DIAGNOSIS — R918 Other nonspecific abnormal finding of lung field: Secondary | ICD-10-CM | POA: Diagnosis not present

## 2022-05-11 DIAGNOSIS — C3492 Malignant neoplasm of unspecified part of left bronchus or lung: Secondary | ICD-10-CM | POA: Diagnosis not present

## 2022-05-11 DIAGNOSIS — Z9889 Other specified postprocedural states: Secondary | ICD-10-CM | POA: Diagnosis not present

## 2022-05-25 DIAGNOSIS — Z08 Encounter for follow-up examination after completed treatment for malignant neoplasm: Secondary | ICD-10-CM | POA: Diagnosis not present

## 2022-05-25 DIAGNOSIS — Z85118 Personal history of other malignant neoplasm of bronchus and lung: Secondary | ICD-10-CM | POA: Diagnosis not present

## 2022-05-25 DIAGNOSIS — C3492 Malignant neoplasm of unspecified part of left bronchus or lung: Secondary | ICD-10-CM | POA: Diagnosis not present

## 2022-06-10 DIAGNOSIS — E559 Vitamin D deficiency, unspecified: Secondary | ICD-10-CM | POA: Diagnosis not present

## 2022-06-10 DIAGNOSIS — R82998 Other abnormal findings in urine: Secondary | ICD-10-CM | POA: Diagnosis not present

## 2022-06-10 DIAGNOSIS — E785 Hyperlipidemia, unspecified: Secondary | ICD-10-CM | POA: Diagnosis not present

## 2022-06-10 DIAGNOSIS — I10 Essential (primary) hypertension: Secondary | ICD-10-CM | POA: Diagnosis not present

## 2022-06-10 DIAGNOSIS — I700 Atherosclerosis of aorta: Secondary | ICD-10-CM | POA: Diagnosis not present

## 2022-06-10 DIAGNOSIS — E041 Nontoxic single thyroid nodule: Secondary | ICD-10-CM | POA: Diagnosis not present

## 2022-06-10 DIAGNOSIS — R7989 Other specified abnormal findings of blood chemistry: Secondary | ICD-10-CM | POA: Diagnosis not present

## 2022-06-21 ENCOUNTER — Encounter (HOSPITAL_BASED_OUTPATIENT_CLINIC_OR_DEPARTMENT_OTHER): Payer: Self-pay | Admitting: Obstetrics & Gynecology

## 2022-06-21 ENCOUNTER — Ambulatory Visit (INDEPENDENT_AMBULATORY_CARE_PROVIDER_SITE_OTHER): Payer: Medicare HMO | Admitting: Obstetrics & Gynecology

## 2022-06-21 VITALS — BP 142/57 | HR 76 | Ht 61.5 in | Wt 139.4 lb

## 2022-06-21 DIAGNOSIS — I2584 Coronary atherosclerosis due to calcified coronary lesion: Secondary | ICD-10-CM | POA: Diagnosis not present

## 2022-06-21 DIAGNOSIS — I10 Essential (primary) hypertension: Secondary | ICD-10-CM | POA: Diagnosis not present

## 2022-06-21 DIAGNOSIS — Z8619 Personal history of other infectious and parasitic diseases: Secondary | ICD-10-CM | POA: Diagnosis not present

## 2022-06-21 DIAGNOSIS — J439 Emphysema, unspecified: Secondary | ICD-10-CM | POA: Diagnosis not present

## 2022-06-21 DIAGNOSIS — C3492 Malignant neoplasm of unspecified part of left bronchus or lung: Secondary | ICD-10-CM | POA: Diagnosis not present

## 2022-06-21 DIAGNOSIS — Z0000 Encounter for general adult medical examination without abnormal findings: Secondary | ICD-10-CM | POA: Diagnosis not present

## 2022-06-21 DIAGNOSIS — Z9189 Other specified personal risk factors, not elsewhere classified: Secondary | ICD-10-CM | POA: Diagnosis not present

## 2022-06-21 DIAGNOSIS — I700 Atherosclerosis of aorta: Secondary | ICD-10-CM | POA: Diagnosis not present

## 2022-06-21 DIAGNOSIS — E785 Hyperlipidemia, unspecified: Secondary | ICD-10-CM | POA: Diagnosis not present

## 2022-06-21 DIAGNOSIS — I4510 Unspecified right bundle-branch block: Secondary | ICD-10-CM | POA: Diagnosis not present

## 2022-06-21 DIAGNOSIS — M8580 Other specified disorders of bone density and structure, unspecified site: Secondary | ICD-10-CM | POA: Diagnosis not present

## 2022-06-21 DIAGNOSIS — Z79890 Hormone replacement therapy (postmenopausal): Secondary | ICD-10-CM | POA: Diagnosis not present

## 2022-06-21 DIAGNOSIS — E663 Overweight: Secondary | ICD-10-CM | POA: Diagnosis not present

## 2022-06-21 DIAGNOSIS — R69 Illness, unspecified: Secondary | ICD-10-CM | POA: Diagnosis not present

## 2022-06-21 DIAGNOSIS — Z1339 Encounter for screening examination for other mental health and behavioral disorders: Secondary | ICD-10-CM | POA: Diagnosis not present

## 2022-06-21 DIAGNOSIS — Z1331 Encounter for screening for depression: Secondary | ICD-10-CM | POA: Diagnosis not present

## 2022-06-21 DIAGNOSIS — F419 Anxiety disorder, unspecified: Secondary | ICD-10-CM | POA: Diagnosis not present

## 2022-06-21 MED ORDER — PROMETRIUM 100 MG PO CAPS: 100.0000 mg | ORAL_CAPSULE | Freq: Every day | ORAL | 4 refills | Status: DC

## 2022-06-21 MED ORDER — ESTRADIOL 0.025 MG/24HR TD PTTW: 1.0000 | MEDICATED_PATCH | TRANSDERMAL | 4 refills | Status: DC

## 2022-06-21 NOTE — Progress Notes (Addendum)
86 y.o. G11P1011 Divorced White or Caucasian female here for breast and pelvic exam.  I am also following her for HRT use.  Does not want to stop this.  Feels It really helps with her anti-aging.  Risks reviewed including stroke and PE.  Does not want to stop this.  Desires branded prescription.  Prior authorization was needed last year.  Denies vaginal bleeding.  Joint surgery last year went very well.    Having some increased issues with getting up at night to urinate.  Not leaking.  Denies dysuria.  Medication and possible referral discussed.  She will consider.  Patient's last menstrual period was 08/16/1993.          Sexually active: Yes.    H/o HSV  Health Maintenance: PCP:  Dr. Virgina Jock.  Has appt today.  Lab work done last week.  Vit D was 82.  Discussed with pt decreasing her Vit D OTC dosing.     Vaccines are up to date:  reviewed Colonoscopy:  07/27/2019 at [facility]:  07/28/2021 Negative BMD:  08/15/2019, -2.4 left femoral neck Last pap smear:  07/13/2021 Negative.   H/o abnormal pap smear:  h/o LEEP    reports that she quit smoking about 59 years ago. Her smoking use included cigarettes. She has never used smokeless tobacco. She reports current alcohol use of about 5.0 standard drinks of alcohol per week. She reports that she does not use drugs.  Past Medical History:  Diagnosis Date   Anemia    secondary to taking omeprazole-on iron   Cancer ([place]) 08/2020   lung   Diverticulosis 09/2002   Dysrhythmia    a-fib after surgery 09/04/20. treated with Metoprolol for 6 week   GERD (gastroesophageal reflux disease)    Hyperlipidemia    Hypertension    Lung nodule    Osteopenia    Post traumatic stress disorder    STD (sexually transmitted disease)    HSV    Past Surgical History:  Procedure Laterality Date   APPENDECTOMY     CERVICAL BIOPSY  W/ LOOP ELECTRODE EXCISION     CIN1   COLPOSCOPY     ESOPHAGEAL DILATION  08/17/2011   FACIAL COSMETIC SURGERY  04/16/2002   KNEE  SURGERY     LUNG REMOVAL, PARTIAL Left 08/2020   no chemo or radiation needed   TONSILLECTOMY     TOTAL KNEE ARTHROPLASTY Left 06/08/2021   Procedure: TOTAL KNEE ARTHROPLASTY;  Surgeon: Gaynelle Arabian, MD;  Location: [facility];  Service: Orthopedics;  Laterality: Left;    Current Outpatient Medications  Medication Sig Dispense Refill   Cholecalciferol 125 MCG (5000 UT) TABS Take 1 tablet by mouth daily.     EPIPEN 2-PAK 0.3 MG/0.3ML SOAJ injection Inject 0.3 mg into the muscle as needed for anaphylaxis [employer] fast streamer).     estradiol (VIVELLE-DOT) 0.025 MG/24HR Place 1 patch onto the skin 2 (two) times a week. 24 patch 4   fexofenadine (ALLEGRA) 180 MG tablet Take 180 mg by mouth daily as needed for allergies.     hydrochlorothiazide (MICROZIDE) 12.5 MG capsule Take 12.5 mg by mouth daily.     losartan (COZAAR) 50 MG tablet Take 1 tablet (50 mg total) by mouth daily. 90 tablet 3   nitroGLYCERIN (NITROSTAT) 0.4 MG SL tablet Place 1 tablet (0.4 mg total) under the tongue every 5 (five) minutes as needed for chest pain. 30 tablet 0   Omega-3 1000 MG CAPS Take 1 capsule by mouth daily at  6 (six) AM.     omeprazole (PRILOSEC) 10 MG capsule Take 10 mg by mouth daily as needed (acid reflux).  11   tretinoin (RETIN-A) 0.025 % cream Apply 1 application topically at bedtime.      No current facility-administered medications for this visit.    Family History  Problem Relation Age of Onset   COPD Mother    Sudden death Father 13   Healthy Brother     Review of Systems  Constitutional: Negative.     Exam:   BP (!) 142/57 (BP Location: Left Arm, Patient Position: Sitting, Cuff Size: Normal)   Pulse 76   Ht 5' 1.5" (1.562 m) Comment: Reported  Wt 139 lb 6.4 oz (63.2 kg)   LMP 08/16/1993   BMI 25.91 kg/m   Height: 5' 1.5" (156.2 cm) (Reported)  General appearance: alert, cooperative and appears stated age Breasts: normal appearance, no masses or tenderness Abdomen: soft, non-tender; bowel  sounds normal; no masses,  no organomegaly Lymph nodes: Cervical, supraclavicular, and axillary nodes normal.  No abnormal inguinal nodes palpated Neurologic: Grossly normal  Pelvic: External genitalia:  no lesions              Urethra:  normal appearing urethra with no masses, tenderness or lesions              Bartholins and Skenes: normal                 Vagina: normal appearing vagina with atrophic changes and no discharge, no lesions              Cervix: no lesions              Pap taken: No. Bimanual Exam:  Uterus:  normal size, contour, position, consistency, mobility, non-tender              Adnexa: no mass, fullness, tenderness               Rectovaginal: Confirms               Anus:  normal sphincter tone, no lesions  Chaperone, Ezekiel Ina, RN, was present for exam.  Assessment/Plan: 1. GYN exam for high-risk Medicare patient - Pap smear 07/13/2021.  Pt desired to continue due to continued good health.  Not indicated today. - Mammogram 07/28/2021 - Colonoscopy 07/27/2019 at [facility] - Bone mineral density 08/15/2019 - lab work done with Dr. Virgina Jock - vaccines reviewed/updated  2. Hormone replacement therapy (HRT) - risks reviewed specifically DVT/PE/stroke, breast cancer.  Pt does not want to make any changes - PROMETRIUM 100 MG capsule; Take 1 capsule (100 mg total) by mouth daily.  Dispense: 90 capsule; Refill: 4 - estradiol (VIVELLE-DOT) 0.025 MG/24HR; Place 1 patch onto the skin 2 (two) times a week.  Dispense: 24 patch; Refill: 4  3. History of herpes simplex infection - not on any treatment  4. Osteopenia, unspecified location - plan to repeat BMD next year   5.  Nocturia

## 2022-06-25 ENCOUNTER — Other Ambulatory Visit: Payer: Self-pay | Admitting: Obstetrics & Gynecology

## 2022-06-25 DIAGNOSIS — Z96652 Presence of left artificial knee joint: Secondary | ICD-10-CM | POA: Diagnosis not present

## 2022-06-25 DIAGNOSIS — Z1231 Encounter for screening mammogram for malignant neoplasm of breast: Secondary | ICD-10-CM

## 2022-06-28 ENCOUNTER — Telehealth (HOSPITAL_BASED_OUTPATIENT_CLINIC_OR_DEPARTMENT_OTHER): Payer: Self-pay | Admitting: Obstetrics & Gynecology

## 2022-06-28 NOTE — Telephone Encounter (Signed)
Patient called and would  like a call back from the nurse .

## 2022-07-05 NOTE — Telephone Encounter (Signed)
Patient called and would like for someone to please call her bask about a prescription.

## 2022-07-07 ENCOUNTER — Other Ambulatory Visit (HOSPITAL_BASED_OUTPATIENT_CLINIC_OR_DEPARTMENT_OTHER): Payer: Self-pay | Admitting: Obstetrics & Gynecology

## 2022-07-07 DIAGNOSIS — Z780 Asymptomatic menopausal state: Secondary | ICD-10-CM

## 2022-07-07 NOTE — Progress Notes (Signed)
Prior authorization form filled out and faxed for pt

## 2022-07-14 ENCOUNTER — Encounter (HOSPITAL_BASED_OUTPATIENT_CLINIC_OR_DEPARTMENT_OTHER): Payer: Self-pay | Admitting: *Deleted

## 2022-07-26 ENCOUNTER — Telehealth (HOSPITAL_BASED_OUTPATIENT_CLINIC_OR_DEPARTMENT_OTHER): Payer: Self-pay

## 2022-07-26 NOTE — Telephone Encounter (Signed)
Patient called today wanted to get a referral to urologist. She states that it was something that was discussed at her last office visit. She is ok with waiting on Dr.Miller to come back from vacation. tbw

## 2022-07-30 ENCOUNTER — Ambulatory Visit: Payer: Medicare HMO

## 2022-08-02 ENCOUNTER — Ambulatory Visit
Admission: RE | Admit: 2022-08-02 | Discharge: 2022-08-02 | Disposition: A | Discharge status: Home / Self Care | Payer: Medicare HMO | Source: Ambulatory Visit | Attending: Obstetrics & Gynecology | Admitting: Obstetrics & Gynecology

## 2022-08-02 DIAGNOSIS — Z1231 Encounter for screening mammogram for malignant neoplasm of breast: Secondary | ICD-10-CM | POA: Diagnosis not present

## 2022-08-12 ENCOUNTER — Other Ambulatory Visit (HOSPITAL_BASED_OUTPATIENT_CLINIC_OR_DEPARTMENT_OTHER): Payer: Self-pay | Admitting: Obstetrics & Gynecology

## 2022-08-12 DIAGNOSIS — R351 Nocturia: Secondary | ICD-10-CM

## 2022-08-28 ENCOUNTER — Other Ambulatory Visit (HOSPITAL_BASED_OUTPATIENT_CLINIC_OR_DEPARTMENT_OTHER): Payer: Self-pay | Admitting: Obstetrics & Gynecology

## 2022-08-28 DIAGNOSIS — Z79890 Hormone replacement therapy (postmenopausal): Secondary | ICD-10-CM

## 2022-08-30 DIAGNOSIS — M13841 Other specified arthritis, right hand: Secondary | ICD-10-CM | POA: Diagnosis not present

## 2022-08-30 DIAGNOSIS — M79641 Pain in right hand: Secondary | ICD-10-CM | POA: Diagnosis not present

## 2022-08-31 DIAGNOSIS — R350 Frequency of micturition: Secondary | ICD-10-CM | POA: Diagnosis not present

## 2022-08-31 DIAGNOSIS — N3946 Mixed incontinence: Secondary | ICD-10-CM | POA: Diagnosis not present

## 2022-09-07 ENCOUNTER — Other Ambulatory Visit (HOSPITAL_BASED_OUTPATIENT_CLINIC_OR_DEPARTMENT_OTHER): Payer: Self-pay | Admitting: Obstetrics & Gynecology

## 2022-09-07 DIAGNOSIS — Z79890 Hormone replacement therapy (postmenopausal): Secondary | ICD-10-CM

## 2022-09-14 DIAGNOSIS — Z08 Encounter for follow-up examination after completed treatment for malignant neoplasm: Secondary | ICD-10-CM | POA: Diagnosis not present

## 2022-09-14 DIAGNOSIS — Z85118 Personal history of other malignant neoplasm of bronchus and lung: Secondary | ICD-10-CM | POA: Diagnosis not present

## 2022-09-14 DIAGNOSIS — R918 Other nonspecific abnormal finding of lung field: Secondary | ICD-10-CM | POA: Diagnosis not present

## 2022-09-23 DIAGNOSIS — Z85118 Personal history of other malignant neoplasm of bronchus and lung: Secondary | ICD-10-CM | POA: Diagnosis not present

## 2022-09-23 DIAGNOSIS — Z08 Encounter for follow-up examination after completed treatment for malignant neoplasm: Secondary | ICD-10-CM | POA: Diagnosis not present

## 2022-10-08 DIAGNOSIS — R350 Frequency of micturition: Secondary | ICD-10-CM | POA: Diagnosis not present

## 2022-10-08 DIAGNOSIS — N281 Cyst of kidney, acquired: Secondary | ICD-10-CM | POA: Diagnosis not present

## 2022-10-08 DIAGNOSIS — R351 Nocturia: Secondary | ICD-10-CM | POA: Diagnosis not present

## 2022-10-08 DIAGNOSIS — N3946 Mixed incontinence: Secondary | ICD-10-CM | POA: Diagnosis not present

## 2022-10-22 ENCOUNTER — Telehealth (HOSPITAL_BASED_OUTPATIENT_CLINIC_OR_DEPARTMENT_OTHER): Payer: Self-pay | Admitting: Obstetrics & Gynecology

## 2022-10-22 NOTE — Telephone Encounter (Signed)
Patient call ane left a message she is returning Tonya's call.

## 2022-10-25 DIAGNOSIS — L821 Other seborrheic keratosis: Secondary | ICD-10-CM | POA: Diagnosis not present

## 2022-10-25 DIAGNOSIS — L503 Dermatographic urticaria: Secondary | ICD-10-CM | POA: Diagnosis not present

## 2022-10-25 DIAGNOSIS — D224 Melanocytic nevi of scalp and neck: Secondary | ICD-10-CM | POA: Diagnosis not present

## 2022-10-25 DIAGNOSIS — I788 Other diseases of capillaries: Secondary | ICD-10-CM | POA: Diagnosis not present

## 2022-10-25 DIAGNOSIS — L308 Other specified dermatitis: Secondary | ICD-10-CM | POA: Diagnosis not present

## 2022-10-25 DIAGNOSIS — D692 Other nonthrombocytopenic purpura: Secondary | ICD-10-CM | POA: Diagnosis not present

## 2022-10-25 DIAGNOSIS — L814 Other melanin hyperpigmentation: Secondary | ICD-10-CM | POA: Diagnosis not present

## 2022-10-27 DIAGNOSIS — M13841 Other specified arthritis, right hand: Secondary | ICD-10-CM | POA: Diagnosis not present

## 2022-10-27 DIAGNOSIS — M67441 Ganglion, right hand: Secondary | ICD-10-CM | POA: Diagnosis not present

## 2022-11-04 ENCOUNTER — Telehealth (HOSPITAL_BASED_OUTPATIENT_CLINIC_OR_DEPARTMENT_OTHER): Payer: Self-pay

## 2022-11-04 NOTE — Telephone Encounter (Signed)
Called patient to let her know that I tried doing a prior-authorization on Progesterone and Prometrium 100mg  through CoverMyMeds.   I received the following message from CoverMyMeds:  The patient currently has access to the requested medication and a Prior Authorization is not needed for the patient/medication.  Patient lets me know that she received a letter on 10/30/22 stating her medication was approved. She will go to the pharmacy to find out how much she has to pay. If it is more than $100 she will give our office a call back. tbw

## 2022-12-20 DIAGNOSIS — E785 Hyperlipidemia, unspecified: Secondary | ICD-10-CM | POA: Diagnosis not present

## 2022-12-20 DIAGNOSIS — E041 Nontoxic single thyroid nodule: Secondary | ICD-10-CM | POA: Diagnosis not present

## 2022-12-20 DIAGNOSIS — F419 Anxiety disorder, unspecified: Secondary | ICD-10-CM | POA: Diagnosis not present

## 2022-12-20 DIAGNOSIS — E663 Overweight: Secondary | ICD-10-CM | POA: Diagnosis not present

## 2022-12-20 DIAGNOSIS — M8580 Other specified disorders of bone density and structure, unspecified site: Secondary | ICD-10-CM | POA: Diagnosis not present

## 2022-12-20 DIAGNOSIS — N2889 Other specified disorders of kidney and ureter: Secondary | ICD-10-CM | POA: Diagnosis not present

## 2022-12-20 DIAGNOSIS — J439 Emphysema, unspecified: Secondary | ICD-10-CM | POA: Diagnosis not present

## 2022-12-20 DIAGNOSIS — I10 Essential (primary) hypertension: Secondary | ICD-10-CM | POA: Diagnosis not present

## 2022-12-20 DIAGNOSIS — I2584 Coronary atherosclerosis due to calcified coronary lesion: Secondary | ICD-10-CM | POA: Diagnosis not present

## 2022-12-20 DIAGNOSIS — I700 Atherosclerosis of aorta: Secondary | ICD-10-CM | POA: Diagnosis not present

## 2022-12-27 DIAGNOSIS — Z961 Presence of intraocular lens: Secondary | ICD-10-CM | POA: Diagnosis not present

## 2023-01-11 DIAGNOSIS — D649 Anemia, unspecified: Secondary | ICD-10-CM | POA: Diagnosis not present

## 2023-01-11 DIAGNOSIS — I10 Essential (primary) hypertension: Secondary | ICD-10-CM | POA: Diagnosis not present

## 2023-01-11 DIAGNOSIS — E785 Hyperlipidemia, unspecified: Secondary | ICD-10-CM | POA: Diagnosis not present

## 2023-01-18 DIAGNOSIS — Z85118 Personal history of other malignant neoplasm of bronchus and lung: Secondary | ICD-10-CM | POA: Diagnosis not present

## 2023-01-18 DIAGNOSIS — R918 Other nonspecific abnormal finding of lung field: Secondary | ICD-10-CM | POA: Diagnosis not present

## 2023-01-18 DIAGNOSIS — C3492 Malignant neoplasm of unspecified part of left bronchus or lung: Secondary | ICD-10-CM | POA: Diagnosis not present

## 2023-01-18 DIAGNOSIS — Z08 Encounter for follow-up examination after completed treatment for malignant neoplasm: Secondary | ICD-10-CM | POA: Diagnosis not present

## 2023-01-18 DIAGNOSIS — J929 Pleural plaque without asbestos: Secondary | ICD-10-CM | POA: Diagnosis not present

## 2023-01-18 DIAGNOSIS — R911 Solitary pulmonary nodule: Secondary | ICD-10-CM | POA: Diagnosis not present

## 2023-01-18 DIAGNOSIS — Z9889 Other specified postprocedural states: Secondary | ICD-10-CM | POA: Diagnosis not present

## 2023-01-24 DIAGNOSIS — M25571 Pain in right ankle and joints of right foot: Secondary | ICD-10-CM | POA: Diagnosis not present

## 2023-01-25 DIAGNOSIS — Z08 Encounter for follow-up examination after completed treatment for malignant neoplasm: Secondary | ICD-10-CM | POA: Diagnosis not present

## 2023-01-25 DIAGNOSIS — Z85118 Personal history of other malignant neoplasm of bronchus and lung: Secondary | ICD-10-CM | POA: Diagnosis not present

## 2023-02-03 DIAGNOSIS — S93491A Sprain of other ligament of right ankle, initial encounter: Secondary | ICD-10-CM | POA: Diagnosis not present

## 2023-02-03 DIAGNOSIS — S8264XA Nondisplaced fracture of lateral malleolus of right fibula, initial encounter for closed fracture: Secondary | ICD-10-CM | POA: Diagnosis not present

## 2023-02-10 DIAGNOSIS — R600 Localized edema: Secondary | ICD-10-CM | POA: Diagnosis not present

## 2023-02-10 DIAGNOSIS — S8264XA Nondisplaced fracture of lateral malleolus of right fibula, initial encounter for closed fracture: Secondary | ICD-10-CM | POA: Diagnosis not present

## 2023-02-10 DIAGNOSIS — F419 Anxiety disorder, unspecified: Secondary | ICD-10-CM | POA: Diagnosis not present

## 2023-02-10 DIAGNOSIS — R202 Paresthesia of skin: Secondary | ICD-10-CM | POA: Diagnosis not present

## 2023-02-10 DIAGNOSIS — D649 Anemia, unspecified: Secondary | ICD-10-CM | POA: Diagnosis not present

## 2023-02-15 ENCOUNTER — Telehealth: Payer: Self-pay | Admitting: Pulmonary Disease

## 2023-02-15 NOTE — Telephone Encounter (Signed)
Pt. Called wanting to get apt with Dr Tonia Brooms made it for Aug.26th but said that her Dr.'s in [facility] have spoke to Dr. Tonia Brooms before and she has several questions for him and would like to get seen sooner but wants medical advise

## 2023-02-22 DIAGNOSIS — K449 Diaphragmatic hernia without obstruction or gangrene: Secondary | ICD-10-CM | POA: Diagnosis not present

## 2023-02-22 DIAGNOSIS — Z85118 Personal history of other malignant neoplasm of bronchus and lung: Secondary | ICD-10-CM | POA: Diagnosis not present

## 2023-02-22 DIAGNOSIS — I10 Essential (primary) hypertension: Secondary | ICD-10-CM | POA: Diagnosis not present

## 2023-02-22 DIAGNOSIS — R1310 Dysphagia, unspecified: Secondary | ICD-10-CM | POA: Diagnosis not present

## 2023-02-22 DIAGNOSIS — Z881 Allergy status to other antibiotic agents status: Secondary | ICD-10-CM | POA: Diagnosis not present

## 2023-02-22 DIAGNOSIS — Z880 Allergy status to penicillin: Secondary | ICD-10-CM | POA: Diagnosis not present

## 2023-02-22 DIAGNOSIS — D509 Iron deficiency anemia, unspecified: Secondary | ICD-10-CM | POA: Diagnosis not present

## 2023-02-22 DIAGNOSIS — Z79899 Other long term (current) drug therapy: Secondary | ICD-10-CM | POA: Diagnosis not present

## 2023-02-22 DIAGNOSIS — K222 Esophageal obstruction: Secondary | ICD-10-CM | POA: Diagnosis not present

## 2023-02-22 DIAGNOSIS — K219 Gastro-esophageal reflux disease without esophagitis: Secondary | ICD-10-CM | POA: Diagnosis not present

## 2023-02-24 NOTE — Telephone Encounter (Signed)
Could we please get back to pt.on message

## 2023-03-03 NOTE — Telephone Encounter (Signed)
Pt. Still waiting on response

## 2023-03-10 DIAGNOSIS — S8264XD Nondisplaced fracture of lateral malleolus of right fibula, subsequent encounter for closed fracture with routine healing: Secondary | ICD-10-CM | POA: Diagnosis not present

## 2023-03-13 NOTE — Progress Notes (Unsigned)
Synopsis: Referred in August 2023 for lung mass by Creola Corn, MD  Subjective:   PATIENT ID: Marissa Jacobs GENDER: female DOB: December 06, 1935, MRN: [id number]  No chief complaint on file.   This is an 87 year oldFemale, past medical history of lung cancer in January 2022, hypertension, hyperlipidemia. Patient was referred for evaluation of lung mass.  Patient had consultation on 01/05/2022 with surgical oncology by Dr. Jacqulyn Bath at [facility].  She has a history of a stage Ia 3 lung adenocarcinoma status post robotic assisted left upper lobe posterior segmentectomy in January 2022.  She recently had CT imaging that was complete in May 2023.  Increased size and a groundglass nodule in the posterior superior segment of the right lower lobe measuring 2.1 cm concerning for carcinoma in situ versus minimally invasive adeno.  It slowly been growing in size.  Recommending short-term CT follow-up.  Patient was referred after having this short-term CT follow-up for evaluation of the groundglass nodule by Dr. Creola Corn.  Here today to discuss next steps and second opinion on nodule. She has appt with Dr. Jacqulyn Bath at [facility] in September atfter ct chest follow up.  OV 03/14/2023: Patient here today for follow-up.  Came to see me initially for second opinion regarding pulmonary nodule.  Was supposed to have follow-up with Dr. Jacqulyn Bath at [facility] in September.  Last seen by Dr. Jacqulyn Bath surgical oncology in January 2024.  She did not wish to pursue surgery at that time.  Wanted to meet with radiation oncology.  Patient met with radiation oncology in February 2024 at [facility].  They discussed various options including continued observation versus biopsy and pet imaging.  Discussed local treatment options to include SBRT and 3-5 fractions.  Patient met again with Dr. Jacqulyn Bath from surgery in June 2024.  Office note reviewed.  Ultimately recommended proceeding with repeat CT in 3 to 4 months consideration for body with at least  treatment options.       Past Medical History:  Diagnosis Date   Anemia    secondary to taking omeprazole-on iron   Cancer (HCC) 08/2020   lung   Diverticulosis 09/2002   Dysrhythmia    a-fib after surgery 09/04/20. treated with Metoprolol for 6 week   GERD (gastroesophageal reflux disease)    Hyperlipidemia    Hypertension    Lung nodule    Osteopenia    Post traumatic stress disorder    STD (sexually transmitted disease)    HSV     Family History  Problem Relation Age of Onset   COPD Mother    Sudden death Father 67   Healthy Brother      Past Surgical History:  Procedure Laterality Date   APPENDECTOMY     CERVICAL BIOPSY  W/ LOOP ELECTRODE EXCISION     CIN1   COLPOSCOPY     ESOPHAGEAL DILATION  08/17/2011   FACIAL COSMETIC SURGERY  04/16/2002   KNEE SURGERY     LUNG REMOVAL, PARTIAL Left 08/2020   no chemo or radiation needed   TONSILLECTOMY     TOTAL KNEE ARTHROPLASTY Left 06/08/2021   Procedure: TOTAL KNEE ARTHROPLASTY;  Surgeon: Ollen Gross, MD;  Location: [facility];  Service: Orthopedics;  Laterality: Left;    Social History   Socioeconomic History   Marital status: Divorced    Spouse name: Not on file   Number of children: Not on file   Years of education: Not on file   Highest education level: Not  on file  Occupational History   Not on file  Tobacco Use   Smoking status: Former    Current packs/day: 0.00    Types: Cigarettes    Quit date: 02/21/1963    Years since quitting: 60.0   Smokeless tobacco: Never   Tobacco comments:    in college  Vaping Use   Vaping status: Never Used  Substance and Sexual Activity   Alcohol use: Yes    Alcohol/week: 5.0 standard drinks of alcohol    Types: 5 Standard drinks or equivalent per week   Drug use: No   Sexual activity: Yes    Partners: Male    Birth control/protection: Post-menopausal  Other Topics Concern   Not on file  Social History Narrative   Not on file   Social Determinants of Health    Financial Resource Strain: Low Risk  (09/05/2020)   Received from [facility]   Overall Financial Resource Strain (CARDIA)    Difficulty of Paying Living Expenses: Not very hard  Food Insecurity: No Food Insecurity (05/06/2022)   Hunger Vital Sign    Worried About Running Out of Food in the Last Year: Never true    Ran Out of Food in the Last Year: Never true  Transportation Needs: No Transportation Needs (05/06/2022)   PRAPARE - Administrator, Civil Service (Medical): No    Lack of Transportation (Non-Medical): No  Physical Activity: Not on file  Stress: Not on file  Social Connections: Not on file  Intimate Partner Violence: Not on file     Allergies  Allergen Reactions   Tramadol Other (See Comments)   Abobotulinumtoxina Rash    Something like Botox   Codeine Hives   Other     Pt went into AFIB after having a procedure on 09/05/20   Shellfish Allergy Nausea And Vomiting    Crab Meat - nausea and vomiting    Amoxicillin Itching and Rash     Outpatient Medications Prior to Visit  Medication Sig Dispense Refill   Cholecalciferol 125 MCG (5000 UT) TABS Take 1 tablet by mouth daily.     EPIPEN 2-PAK 0.3 MG/0.3ML SOAJ injection Inject 0.3 mg into the muscle as needed for anaphylaxis Chartered certified accountant).     estradiol (VIVELLE-DOT) 0.025 MG/24HR Place 1 patch onto the skin 2 (two) times a week. 24 patch 4   fexofenadine (ALLEGRA) 180 MG tablet Take 180 mg by mouth daily as needed for allergies.     hydrochlorothiazide (MICROZIDE) 12.5 MG capsule Take 12.5 mg by mouth daily.     losartan (COZAAR) 50 MG tablet Take 1 tablet (50 mg total) by mouth daily. 90 tablet 3   nitroGLYCERIN (NITROSTAT) 0.4 MG SL tablet Place 1 tablet (0.4 mg total) under the tongue every 5 (five) minutes as needed for chest pain. 30 tablet 0   Omega-3 1000 MG CAPS Take 1 capsule by mouth daily at 6 (six) AM.     omeprazole (PRILOSEC) 10 MG capsule Take 10 mg by mouth daily as needed  (acid reflux).  11   PROMETRIUM 100 MG capsule Take 1 capsule (100 mg total) by mouth daily. 90 capsule 4   tretinoin (RETIN-A) 0.025 % cream Apply 1 application topically at bedtime.      No facility-administered medications prior to visit.    ROS   Objective:  Physical Exam   There were no vitals filed for this visit.    on RA BMI Readings from  Last 3 Encounters:  06/21/22 25.91 kg/m  05/03/22 26.34 kg/m  03/24/22 26.23 kg/m   Wt Readings from Last 3 Encounters:  06/21/22 139 lb 6.4 oz (63.2 kg)  05/03/22 139 lb 6.4 oz (63.2 kg)  03/24/22 138 lb 12.8 oz (63 kg)     CBC    Component Value Date/Time   WBC 10.2 06/10/2021 0317   RBC 3.34 (L) 06/10/2021 0317   HGB 10.0 (L) 06/10/2021 0317   HCT 30.2 (L) 06/10/2021 0317   PLT 242 06/10/2021 0317   MCV 90.4 06/10/2021 0317   MCH 29.9 06/10/2021 0317   MCHC 33.1 06/10/2021 0317   RDW 13.7 06/10/2021 0317   LYMPHSABS 1.6 03/11/2021 2056   MONOABS 0.7 03/11/2021 2056   EOSABS 0.2 03/11/2021 2056   BASOSABS 0.0 03/11/2021 2056     Chest Imaging: May 2023 CT Chest:  Ground glass lesion in the Right lower lobe.  The patient's images have been independently reviewed by me.     Pulmonary Functions Testing Results:     No data to display          FeNO:   Pathology:   Echocardiogram:   Heart Catheterization:     Assessment & Plan:     ICD-10-CM   1. Ground glass opacity present on imaging of lung  R91.8     2. Pulmonary nodule  R91.1     3. Non-smoker  Z78.9     4. History of lung cancer  Z85.118        Discussion:  87 yo FM, RLL ground glass nodule dating back to 2017. I have reviewed the CT imaging. H/o stage 1 lung cancer s/p resection.     P: Follow up CT imaging planned at [facility] with Dr. Jacqulyn Bath in September.  She is going to have follow up imaging  Pending on the imaging I explained that we are happy to offer biopsy with RAB+CBCT here at [facility]. She can also follow up with my colleague  Dr. Erick Colace also at [facility].   I spent additional time on the phone with the patients son to discuss the ct results and next steps as well.    Current Outpatient Medications:    Cholecalciferol 125 MCG (5000 UT) TABS, Take 1 tablet by mouth daily., Disp: , Rfl:    EPIPEN 2-PAK 0.3 MG/0.3ML SOAJ injection, Inject 0.3 mg into the muscle as needed for anaphylaxis Chartered certified accountant)., Disp: , Rfl:    estradiol (VIVELLE-DOT) 0.025 MG/24HR, Place 1 patch onto the skin 2 (two) times a week., Disp: 24 patch, Rfl: 4   fexofenadine (ALLEGRA) 180 MG tablet, Take 180 mg by mouth daily as needed for allergies., Disp: , Rfl:    hydrochlorothiazide (MICROZIDE) 12.5 MG capsule, Take 12.5 mg by mouth daily., Disp: , Rfl:    losartan (COZAAR) 50 MG tablet, Take 1 tablet (50 mg total) by mouth daily., Disp: 90 tablet, Rfl: 3   nitroGLYCERIN (NITROSTAT) 0.4 MG SL tablet, Place 1 tablet (0.4 mg total) under the tongue every 5 (five) minutes as needed for chest pain., Disp: 30 tablet, Rfl: 0   Omega-3 1000 MG CAPS, Take 1 capsule by mouth daily at 6 (six) AM., Disp: , Rfl:    omeprazole (PRILOSEC) 10 MG capsule, Take 10 mg by mouth daily as needed (acid reflux)., Disp: , Rfl: 11   PROMETRIUM 100 MG capsule, Take 1 capsule (100 mg total) by mouth daily., Disp: 90 capsule, Rfl: 4   tretinoin (RETIN-A) 0.025 %  cream, Apply 1 application topically at bedtime. , Disp: , Rfl:   I spent 63 minutes dedicated to the care of this patient on the date of this encounter to include pre-visit review of records, face-to-face time with the patient discussing conditions above, post visit ordering of testing, clinical documentation with the electronic health record, making appropriate referrals as documented, and communicating necessary findings to members of the patients care team.   Josephine Igo, DO [facility] 03/13/2023 3:11 PM

## 2023-03-14 ENCOUNTER — Ambulatory Visit (INDEPENDENT_AMBULATORY_CARE_PROVIDER_SITE_OTHER): Payer: Medicare HMO | Admitting: Pulmonary Disease

## 2023-03-14 ENCOUNTER — Encounter: Payer: Self-pay | Admitting: Pulmonary Disease

## 2023-03-14 VITALS — BP 112/60 | HR 81 | Ht 61.5 in | Wt 142.0 lb

## 2023-03-14 DIAGNOSIS — Z85118 Personal history of other malignant neoplasm of bronchus and lung: Secondary | ICD-10-CM

## 2023-03-14 DIAGNOSIS — R911 Solitary pulmonary nodule: Secondary | ICD-10-CM | POA: Diagnosis not present

## 2023-03-14 DIAGNOSIS — Z789 Other specified health status: Secondary | ICD-10-CM | POA: Diagnosis not present

## 2023-03-14 DIAGNOSIS — R918 Other nonspecific abnormal finding of lung field: Secondary | ICD-10-CM

## 2023-03-14 NOTE — Patient Instructions (Signed)
Thank you for visiting Dr. Tonia Brooms at [facility]. Today we recommend the following: Orders Placed This Encounter  Procedures   Ambulatory referral to Radiation Oncology   Return if symptoms worsen or fail to improve, for with Kandice Robinsons, NP, or Dr. Tonia Brooms.    Please do your part to reduce the spread of COVID-19.

## 2023-03-15 ENCOUNTER — Inpatient Hospital Stay
Admission: RE | Admit: 2023-03-15 | Discharge: 2023-03-15 | Disposition: A | Discharge status: Home / Self Care | Payer: Self-pay | Source: Ambulatory Visit | Attending: Radiation Oncology | Admitting: Radiation Oncology

## 2023-03-15 ENCOUNTER — Other Ambulatory Visit: Payer: Self-pay | Admitting: Radiation Oncology

## 2023-03-15 ENCOUNTER — Telehealth: Payer: Self-pay | Admitting: Radiation Oncology

## 2023-03-15 DIAGNOSIS — R918 Other nonspecific abnormal finding of lung field: Secondary | ICD-10-CM

## 2023-03-15 NOTE — Telephone Encounter (Signed)
LVM to schedule CON with Dr. Kathrynn Running tomorrow as requested by provider.

## 2023-03-15 NOTE — Telephone Encounter (Signed)
Pt c/b to schedule consult. Pt states she has a different appt that she will try to move to make this. Pt was provided best contact number to call in case she needs to r/s.

## 2023-03-16 ENCOUNTER — Ambulatory Visit: Payer: Medicare HMO

## 2023-03-16 ENCOUNTER — Ambulatory Visit
Admission: RE | Admit: 2023-03-16 | Discharge: 2023-03-16 | Disposition: A | Discharge status: Home / Self Care | Payer: Medicare HMO | Source: Ambulatory Visit | Attending: Radiation Oncology | Admitting: Radiation Oncology

## 2023-03-16 DIAGNOSIS — R918 Other nonspecific abnormal finding of lung field: Secondary | ICD-10-CM

## 2023-03-16 DIAGNOSIS — C3431 Malignant neoplasm of lower lobe, right bronchus or lung: Secondary | ICD-10-CM | POA: Insufficient documentation

## 2023-03-16 DIAGNOSIS — M67441 Ganglion, right hand: Secondary | ICD-10-CM | POA: Diagnosis not present

## 2023-03-16 DIAGNOSIS — M13841 Other specified arthritis, right hand: Secondary | ICD-10-CM | POA: Diagnosis not present

## 2023-03-16 DIAGNOSIS — M13831 Other specified arthritis, right wrist: Secondary | ICD-10-CM | POA: Diagnosis not present

## 2023-03-16 DIAGNOSIS — M13849 Other specified arthritis, unspecified hand: Secondary | ICD-10-CM | POA: Diagnosis not present

## 2023-03-26 ENCOUNTER — Other Ambulatory Visit: Payer: Self-pay | Admitting: Critical Care Medicine

## 2023-03-26 ENCOUNTER — Telehealth: Payer: Self-pay | Admitting: Critical Care Medicine

## 2023-03-26 DIAGNOSIS — U071 COVID-19: Secondary | ICD-10-CM

## 2023-03-26 MED ORDER — MOLNUPIRAVIR 200 MG PO CAPS: 4.0000 | ORAL_CAPSULE | Freq: Two times a day (BID) | ORAL | 0 refills | Status: AC

## 2023-03-26 NOTE — Telephone Encounter (Signed)
Ms. Loeffel called back and told me her molnupiravir prescription is not covered by insurance and wants them to review it. She wanted to let me know her insurance will be calling me to ask. She does have a paxlovid prescription already in hand, and I informed her that we may not be able to get molnupiravir covered if she has already filled paxlovid and not tried taking it. I will speak to her insurance company when they call.   Reviewed med list with pharmD in the hospital today (used micromedX to run drug interaction checker): no contraindications with her meds + paxlovid. Safe to take paxlovid from my standpoint.   Steffanie Dunn, DO 03/26/23 1:12 PM [facility]

## 2023-03-26 NOTE — Telephone Encounter (Signed)
Ms. Stave is an 87 y/o woman with a history of lung cancer. She has tested positive for covid yesterday. She wanted to know if she should take an antiviral medication due to her lung condition. She has a GGN that is being monitored due to her history of lung cancer. Although her history of lung cancer doesn't increase her risk of severe covid, her age does increase her risk.   She has paxlovid (? From PCP on call physician)-- doesn't want to take this; molnupiravir has worked for her in the past and she would like to try this again. We discussed her other meds and I advised her to hold her herbal supplements while on molnupiravir due to unknown interaction potential.  ED vs office clinic follow up precautions discussed in case she develops signfiicant breathing difficulty. Med sent to her pharmacy.   Steffanie Dunn, DO 03/26/23 11:29 AM [facility]

## 2023-03-28 ENCOUNTER — Ambulatory Visit
Admission: RE | Admit: 2023-03-28 | Discharge: 2023-03-28 | Disposition: A | Discharge status: Home / Self Care | Payer: Medicare HMO | Source: Ambulatory Visit | Attending: Radiation Oncology | Admitting: Radiation Oncology

## 2023-03-28 ENCOUNTER — Ambulatory Visit: Payer: Medicare HMO

## 2023-03-28 ENCOUNTER — Telehealth: Payer: Self-pay | Admitting: Radiation Oncology

## 2023-03-28 DIAGNOSIS — C3431 Malignant neoplasm of lower lobe, right bronchus or lung: Secondary | ICD-10-CM

## 2023-03-28 NOTE — Telephone Encounter (Signed)
Pt called and asked to r/s today's appt due to COVID. I offered telephone and mychart options to keep appt. Pt requested appt be moved out because she wanted to keep it in-person. Pt will be traveling and asked for delay due to this. Appt r/s per her request.

## 2023-04-11 ENCOUNTER — Ambulatory Visit: Payer: Medicare HMO | Admitting: Pulmonary Disease

## 2023-04-15 ENCOUNTER — Other Ambulatory Visit (HOSPITAL_BASED_OUTPATIENT_CLINIC_OR_DEPARTMENT_OTHER): Payer: Self-pay | Admitting: Obstetrics & Gynecology

## 2023-04-15 DIAGNOSIS — Z79890 Hormone replacement therapy (postmenopausal): Secondary | ICD-10-CM

## 2023-04-19 NOTE — Progress Notes (Signed)
Thoracic Location of Tumor / Histology:  Stage IA, NSCLC, adenocarcinoma of the right lower lobe lung   Stage IA 3 lung adenocarcinoma status post robotic assisted left upper lobe posterior segmentectomy in January 2022.   01/18/2023 CT CHEST WO CONTRAST  CLINICAL INDICATION: lung nodule surveillance ; Non - small cell lung cancer (NSCLC), monitor  - V40 - Encounter for follow - up surveillance of lung cancer - Z85.118 - Encounter for follow - up surveillance of lung cancer   FINDINGS: LUNGS, AIRWAYS, AND PLEURA: Centrilobular emphysema. Stable 2.3 cm groundglass nodule in right lower lobe (2:233).. Additional stable scattered subcentimeter solid and groundglass nodules bilaterally. New peripheral clustered nodules in middle lobe measuring 0.5 cm. Peripheral nodular pleural thickening overlying the left lower lobe. Stable postoperative changes of left upper lobe wedge resection. Central airways are patent. No pleural effusion or pneumothorax.  MEDIASTINUM AND LYMPH NODES: No enlarged intrathoracic, axillary, or supraclavicular lymph nodes. No mediastinal mass or other abnormality. Large hiatal hernia.  HEART AND VASCULATURE: Mild qualitative dilatation of left atrium. No pericardial effusion. Aorta is normal in caliber. Main pulmonary artery is normal in size. Atherosclerotic aortic calcifications. Moderate three-vessel coronary calcifications.  CHEST WALL AND BONES: Multilevel degenerative changes in spine.  UPPER ABDOMEN: Stable thickening of left adrenal gland  OTHER: No other findings.  IMPRESSION: Stable 2.3 cm right lower lobe groundglass nodule. Additional stable subcentimeter solid and groundglass nodules bilaterally.  New peripheral clustered nodules in middle lobe measuring up to 0.5 cm; nonspecific, may represent infectious versus inflammatory etiology. Recommend follow-up CT in 3 months to ensure resolution.  Stable postoperative changes of left upper lobe wedge resection with no  new soft tissue/nodularity along the suture line. Stable peripheral nodular pleural thickening overlying the left lower lobe.  09/14/2022 Dr. Corena Pilgrim CT CHEST WO CONTRAST CLINICAL INDICATION: lung cancer surveillance  - Z08 - Encounter for follow - up surveillance of lung cancer - Z85.118 - Encounter for follow - up surveillance of lung cancer  FINDINGS: LUNGS, AIRWAYS, AND PLEURA: Centrilobular emphysema. Marginal interval increase in size of 2.3 cm groundglass nodule in right lower lobe (3:91, previously measured 2 cm. Additional stable scattered subcentimeter pulmonary nodules bilaterally measuring up to 0.4 cm (3:39, 77)). Stable 0.7 cm right upper lobe groundglass nodule (3:62) and peripheral left lower lobe 0.5 cm groundglass (4:89).. Stable peripheral nodular pleural thickening overlying the left lower lobe. Stable postoperative changes of left upper lobe wedge resection.. Central airways are patent. No pleural effusion or pneumothorax.  MEDIASTINUM AND LYMPH NODES: No enlarged intrathoracic, axillary, or supraclavicular lymph nodes. No mediastinal mass or other abnormality. Large hiatal hernia.  HEART AND VASCULATURE: Mild qualitative dilatation of left atrium. No pericardial effusion. Aorta is normal in caliber. Main pulmonary artery is normal in size. Atherosclerotic aortic calcifications. Moderate three-vessel coronary calcifications.  CHEST WALL AND BONES: Multilevel degenerative changes in spine.Marland Kitchen  UPPER ABDOMEN: New 3.4 cm exophytic left renal cyst. Stable thickening of left adrenal gland  OTHER: No other findings.  IMPRESSION: Marginal interval increase in size of 2.3 cm groundglass nodule in right lower lobe. Additional stable bilateral subcentimeter solid and groundglass nodules measuring up to 0.7 cm. Recommend follow-up CT in 3 to 6 months for further assessment.  Stable postoperative changes of left upper lobe wedge resection with no new soft tissue/nodularity  along the suture line. Stable peripheral nodular pleural thickening overlying the left lower lobe.  Tobacco/Marijuana/Snuff/ETOH use: Former smoker, no smokeless tobacco use, no drug use, yes to alcohol  use.  Past/Anticipated interventions by cardiothoracic surgery, if any:  NA  Past/Anticipated interventions by medical oncology, if any:  NA  Signs/Symptoms Weight changes, if any: No Respiratory complaints, if any: No Hemoptysis, if any: No Pain issues, if any:  0/10  SAFETY ISSUES: Prior radiation?  No Pacemaker/ICD?  No Possible current pregnancy? Postmenopausal Is the patient on methotrexate? No  Current Complaints / other details:   No

## 2023-04-21 NOTE — Progress Notes (Signed)
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    ICD-10-CM   1. Primary non-small cell carcinoma of lower lobe of right lung (HCC)  C34.31       HISTORY OF PRESENT ILLNESS: ANGELLEA SANTOSUOSSO is a 87 y.o. female seen at the request of Dr. Tonia Brooms.  She has a history of pT1c, stage IA 3, NSCLC, adenocarcinoma of the LUL s/p posterior segmentectomy in January 2022 under the care of of Dr. Jacqulyn Bath at [facility].  She has been followed in surveillance with serial CT chest imaging since that time.  CT chest in May 2023 showed interval enlargement of a groundglass nodule in the superior segment of the RLL measuring approximately 2.1 cm, slowly growing.  She initially met with Dr. Tonia Brooms in August 2023 for second opinion and ultimately elected to continue with surveillance imaging at [facility].  The RLL nodule was stable on follow-up CT imaging 05/11/2022 and showed only a marginal increase in size (2.3 cm) on CT chest imaging from 09/14/2022.  She was referred to Dr. Edwyna Ready in radiation oncology at [facility] in February 2024 and they discussed SBRT but the recommendation was for biopsy for tissue confirmation prior to treatment.  After a follow-up discussion with Dr. Jacqulyn Bath, the patient elected to continue in surveillance and had a repeat CT chest scan 01/18/2023 which showed stability of the RLL nodule.  She met back with Dr. Tonia Brooms on 03/14/2023 and again discussed the option for proceeding with robotic assisted bronchoscopy for tissue sampling versus moving forward with definitive treatment without a tissue diagnosis.  She has remained adamantly not interested in surgery or bronchoscopy so  she has been kindly referred to [place] today to discuss the potential option to proceed with SBRT without biopsy versus continuing in active surveillance.  PREVIOUS RADIATION THERAPY: No  PAST MEDICAL HISTORY:  Past Medical History:  Diagnosis Date   Anemia    secondary to taking omeprazole-on iron   Cancer (HCC) 08/2020   lung   Diverticulosis 09/2002   Dysrhythmia    a-fib after surgery 09/04/20. treated with Metoprolol for 6 week   GERD (gastroesophageal reflux disease)    Hyperlipidemia    Hypertension    Lung nodule    Osteopenia    Post traumatic stress disorder    STD (sexually transmitted disease)    HSV      PAST SURGICAL HISTORY: Past Surgical History:  Procedure Laterality Date   APPENDECTOMY     CERVICAL BIOPSY  W/ LOOP ELECTRODE EXCISION     CIN1   COLPOSCOPY     ESOPHAGEAL DILATION  08/17/2011   FACIAL COSMETIC SURGERY  04/16/2002   KNEE SURGERY     LUNG REMOVAL, PARTIAL Left 08/2020   no chemo or radiation needed   TONSILLECTOMY     TOTAL KNEE ARTHROPLASTY Left 06/08/2021   Procedure: TOTAL KNEE ARTHROPLASTY;  Surgeon: Ollen Gross, MD;  Location: [facility];  Service: Orthopedics;  Laterality: Left;    FAMILY HISTORY:  Family History  Problem Relation Age of Onset   COPD Mother    Sudden death Father 24   Healthy Brother     SOCIAL HISTORY:  Social History   Socioeconomic History   Marital status: Divorced    Spouse name: Not on file   Number of children: Not on file   Years of education: Not on file   Highest education level: Not on file  Occupational History   Not on file  Tobacco Use   Smoking status: Former    Current packs/day: 0.00    Types: Cigarettes    Quit date: 02/21/1963    Years since quitting: 60.2   Smokeless tobacco: Never   Tobacco comments:    in college  Vaping Use   Vaping status: Never Used  Substance and Sexual Activity   Alcohol use: Yes    Alcohol/week: 5.0 standard drinks of alcohol    Types: 5 Standard drinks  or equivalent per week   Drug use: No   Sexual activity: Yes    Partners: Male    Birth control/protection: Post-menopausal  Other Topics Concern   Not on file  Social History Narrative   Not on file   Social Determinants of Health   Financial Resource Strain: Low Risk  (09/05/2020)   Received from [place]   Overall Financial Resource Strain (CARDIA)    Difficulty of Paying Living Expenses: Not very hard  Food Insecurity: No Food Insecurity (05/06/2022)   Hunger Vital Sign    Worried About Running Out of Food in the Last Year: Never true    Ran Out of Food in the Last Year: Never true  Transportation Needs: No Transportation Needs (05/06/2022)   PRAPARE - Administrator, Civil Service (Medical): No    Lack of Transportation (Non-Medical): No  Physical Activity: Not on file  Stress: Not on file  Social Connections: Not on file  Intimate Partner Violence: Not on file    ALLERGIES: Tramadol, Abobotulinumtoxina, Codeine, Other, Shellfish allergy, and Amoxicillin  MEDICATIONS:  Current Outpatient Medications  Medication Sig Dispense Refill   Cholecalciferol 125 MCG (5000 UT) TABS Take 1 tablet by mouth daily.     EPIPEN 2-PAK 0.3 MG/0.3ML SOAJ injection Inject 0.3 mg into the muscle as needed for anaphylaxis Chartered certified accountant).     estradiol (VIVELLE-DOT) 0.025 MG/24HR Place 1 patch onto the skin 2 (two) times a week. 24 patch 4   fexofenadine (ALLEGRA) 180 MG tablet Take 180 mg by mouth daily as needed for allergies.     hydrochlorothiazide (MICROZIDE) 12.5 MG capsule Take 12.5 mg by mouth daily.     losartan (COZAAR) 50 MG tablet Take 1 tablet (50 mg total) by mouth daily. 90 tablet 3   nitroGLYCERIN (NITROSTAT) 0.4 MG SL tablet Place 1 tablet (0.4 mg total) under the tongue every 5 (five) minutes as needed for chest pain. 30 tablet 0   Omega-3 1000 MG CAPS Take 1 capsule by mouth daily at 6 (six) AM.     omeprazole (PRILOSEC) 10 MG capsule Take 10 mg  by mouth daily as needed (acid reflux).  11   PROMETRIUM 100 MG capsule Take 1 capsule (100 mg total) by mouth daily. 90 capsule 4   tretinoin (RETIN-A) 0.025 % cream Apply 1 application topically at bedtime.      No current facility-administered medications for this visit.    REVIEW OF SYSTEMS:  On review of systems, the patient reports that she is doing well overall. She denies any chest pain, shortness of breath, cough, fevers, chills, night sweats, unintended weight changes. She denies any bowel or bladder disturbances, and denies  abdominal pain, nausea or vomiting. She denies any new musculoskeletal or joint aches or pains. A complete review of systems is obtained and is otherwise negative.    PHYSICAL EXAM:  Wt Readings from Last 3 Encounters:  03/14/23 142 lb (64.4 kg)  06/21/22 139 lb 6.4 oz (63.2 kg)  05/03/22 139 lb 6.4 oz (63.2 kg)   Temp Readings from Last 3 Encounters:  06/10/21 97.8 F (36.6 C) (Oral)  05/26/21 98 F (36.7 C) (Oral)  03/12/21 98.6 F (37 C) (Oral)   BP Readings from Last 3 Encounters:  03/14/23 112/60  06/21/22 (!) 142/57  05/03/22 122/62   Pulse Readings from Last 3 Encounters:  03/14/23 81  06/21/22 76  05/03/22 70    /10 In general this is a well appearing Caucasian woman in no acute distress.  She's alert and oriented x4 and appropriate throughout the examination. Cardiopulmonary assessment is negative for acute distress and she exhibits normal effort.    KPS = 100  100 - Normal; no complaints; no evidence of disease. 90   - Able to carry on normal activity; minor signs or symptoms of disease. 80   - Normal activity with effort; some signs or symptoms of disease. 98   - Cares for self; unable to carry on normal activity or to do active work. 60   - Requires occasional assistance, but is able to care for most of his personal needs. 50   - Requires considerable assistance and frequent medical care. 40   - Disabled; requires special care  and assistance. 30   - Severely disabled; hospital admission is indicated although death not imminent. 20   - Very sick; hospital admission necessary; active supportive treatment necessary. 10   - Moribund; fatal processes progressing rapidly. 0     - Dead  Karnofsky DA, Abelmann WH, Craver LS and Burchenal [facility] [phone]) The use of the nitrogen mustards in the palliative treatment of carcinoma: with particular reference to bronchogenic carcinoma Cancer 1 [id number]  LABORATORY DATA:  Lab Results  Component Value Date   WBC 10.2 06/10/2021   HGB 10.0 (L) 06/10/2021   HCT 30.2 (L) 06/10/2021   MCV 90.4 06/10/2021   PLT 242 06/10/2021   Lab Results  Component Value Date   NA 131 (L) 06/10/2021   K 3.8 06/10/2021   CL 98 06/10/2021   CO2 28 06/10/2021   Lab Results  Component Value Date   ALT 20 05/26/2021   AST 26 05/26/2021   ALKPHOS 72 05/26/2021   BILITOT 0.8 05/26/2021     RADIOGRAPHY: No results found.    IMPRESSION/PLAN: 1. 87 y.o. 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 Today, we talked to the patient about the findings and workup thus far. We discussed the natural history of early-stage NSCLC, adenocarcinoma and general treatment, highlighting the role of radiotherapy in the management.  While it is ideal to have tissue confirmation prior to treatment, we agree that at her advanced age and well documented radiographic appearance of adenocarcinoma, it is reasonable to proceed with definitive treatment without biopsy.  We discussed the available radiation techniques, and focused on the details and logistics of delivery.  The recommendation is for a 5 fraction course of SBRT to the RLL lung nodule to reduce collateral damage to the nearby airway.  We reviewed the anticipated acute and late sequelae associated with radiation in this setting. The patient was encouraged to ask questions that were answered  to her stated satisfaction.  Given her  personal history of adenocarcinoma and the radiographic features of her slowly enlarging subsolid right lower lung nodule, I would feel comfortable considering empiric SBRT.  With our platform, on the TrueBeam STx, we would not require internal fiducial marker placement.  She has been very pleased by the quality of care received at [facility] and the distance from home does not seem to pose as a barrier.  At the conclusion of our conversation, the patient is interested in proceeding with the scheduled follow up CT Chest scan at [facility] on 05/12/23 and will meet with Dr. Edwyna Ready thereafter to review results and further discuss treatment options/recommendations. She has our contact information and knows that she is welcome to call with any further questions and/or should she ultimately elect to proceed with SBRT here in [place]. We enjoyed meeting her today and look forward to following along in her care.  I personally spent 70 minutes in this encounter including chart review, reviewing radiological studies, meeting face-to-face with the patient, entering orders and completing documentation.    Marguarite Arbour, PA-C    Margaretmary Dys, MD  [facility]  Radiation Oncology Direct Dial: [phone]  Fax: [phone] [email or web address]  Skype  [email or web address]

## 2023-04-21 NOTE — Addendum Note (Signed)
Encounter addended by: Marcello Fennel, PA-C on: 04/21/2023 4:40 PM  Actions taken: Delete clinical note

## 2023-04-22 ENCOUNTER — Ambulatory Visit
Admission: RE | Admit: 2023-04-22 | Discharge: 2023-04-22 | Disposition: A | Discharge status: Home / Self Care | Payer: Medicare HMO | Source: Ambulatory Visit | Attending: Radiation Oncology

## 2023-04-22 ENCOUNTER — Ambulatory Visit
Admission: RE | Admit: 2023-04-22 | Discharge: 2023-04-22 | Disposition: A | Discharge status: Home / Self Care | Payer: Medicare HMO | Source: Ambulatory Visit | Attending: Radiation Oncology | Admitting: Radiation Oncology

## 2023-04-22 ENCOUNTER — Encounter: Payer: Self-pay | Admitting: Radiation Oncology

## 2023-04-22 VITALS — BP 160/62 | HR 84 | Temp 97.8°F | Resp 18 | Ht 61.5 in | Wt 139.4 lb

## 2023-04-22 DIAGNOSIS — D649 Anemia, unspecified: Secondary | ICD-10-CM | POA: Diagnosis not present

## 2023-04-22 DIAGNOSIS — I10 Essential (primary) hypertension: Secondary | ICD-10-CM | POA: Insufficient documentation

## 2023-04-22 DIAGNOSIS — R918 Other nonspecific abnormal finding of lung field: Secondary | ICD-10-CM | POA: Insufficient documentation

## 2023-04-22 DIAGNOSIS — Z79899 Other long term (current) drug therapy: Secondary | ICD-10-CM | POA: Diagnosis not present

## 2023-04-22 DIAGNOSIS — C3431 Malignant neoplasm of lower lobe, right bronchus or lung: Secondary | ICD-10-CM

## 2023-04-22 DIAGNOSIS — M8580 Other specified disorders of bone density and structure, unspecified site: Secondary | ICD-10-CM | POA: Insufficient documentation

## 2023-04-22 DIAGNOSIS — Z87891 Personal history of nicotine dependence: Secondary | ICD-10-CM | POA: Insufficient documentation

## 2023-04-22 DIAGNOSIS — E785 Hyperlipidemia, unspecified: Secondary | ICD-10-CM | POA: Insufficient documentation

## 2023-04-22 DIAGNOSIS — Z79890 Hormone replacement therapy (postmenopausal): Secondary | ICD-10-CM | POA: Insufficient documentation

## 2023-04-22 DIAGNOSIS — I4891 Unspecified atrial fibrillation: Secondary | ICD-10-CM | POA: Insufficient documentation

## 2023-04-22 DIAGNOSIS — K219 Gastro-esophageal reflux disease without esophagitis: Secondary | ICD-10-CM | POA: Diagnosis not present

## 2023-04-27 ENCOUNTER — Telehealth: Payer: Self-pay

## 2023-04-27 NOTE — Telephone Encounter (Signed)
RN return call patient had wanted to view SBRT video again while her son was here in [place] visiting form [place].  Marissa Jacobs was seen 04/22/2023 for consultation Non small cell carcinoma of lower lobe of right lung.  She still indecisive and may have more questions about treatment but wanted her son present.  Will inform Ashlyn Bruning,PA-C of request.

## 2023-04-27 NOTE — Telephone Encounter (Signed)
RN called patient to inform patient of a off-site video she could watch at home on her computer with son to explain radiation SBRT treatment for lung cancer.

## 2023-05-12 ENCOUNTER — Telehealth: Payer: Self-pay

## 2023-05-12 NOTE — Telephone Encounter (Addendum)
Marissa Jacobs called requested to see radiation video again before she goes to her appointment for CT scan at [facility] on 05/13/2023.  Marissa Jacobs came into our clinic for consultation on 04/22/2023 for right lower lobe lung cancer.  At that time she wasn't sure which direction she was going as it pertains to her treatment.  Her plan was to have CT scan done on 05/12/2023 but date was changed to 05/13/2023 so, she requested to see the SBRT radiation video again today 05/12/2023 before appointment tomorrow.  RN offered some time this afternoon (1:30-2:00 pm) for Marissa Jacobs to view radiation video again per [employer].

## 2023-05-13 DIAGNOSIS — J219 Acute bronchiolitis, unspecified: Secondary | ICD-10-CM | POA: Diagnosis not present

## 2023-05-13 DIAGNOSIS — R918 Other nonspecific abnormal finding of lung field: Secondary | ICD-10-CM | POA: Diagnosis not present

## 2023-05-13 DIAGNOSIS — C3492 Malignant neoplasm of unspecified part of left bronchus or lung: Secondary | ICD-10-CM | POA: Diagnosis not present

## 2023-05-23 ENCOUNTER — Telehealth (HOSPITAL_BASED_OUTPATIENT_CLINIC_OR_DEPARTMENT_OTHER): Payer: Self-pay | Admitting: Obstetrics & Gynecology

## 2023-05-23 NOTE — Telephone Encounter (Signed)
Patient would like for some one to please call her about a refill.

## 2023-05-25 DIAGNOSIS — H903 Sensorineural hearing loss, bilateral: Secondary | ICD-10-CM | POA: Diagnosis not present

## 2023-05-27 NOTE — Telephone Encounter (Signed)
Called patient back for details about requested medication. Patient states she is unable to get Vivelle dot. It is hard to get ANYWHERE. Pharmacist at [facility] told her that he could order her the Climara or Estradot. She prefers the Climara and they do have some in stock right now. She would like for [place] to send in a Rx for Climara that is equivalent to the Vivelle dot that she was taking to the [facility] on [address].

## 2023-05-29 ENCOUNTER — Other Ambulatory Visit (HOSPITAL_BASED_OUTPATIENT_CLINIC_OR_DEPARTMENT_OTHER): Payer: Self-pay | Admitting: Obstetrics & Gynecology

## 2023-05-31 ENCOUNTER — Other Ambulatory Visit (HOSPITAL_BASED_OUTPATIENT_CLINIC_OR_DEPARTMENT_OTHER): Payer: Self-pay | Admitting: Obstetrics & Gynecology

## 2023-05-31 DIAGNOSIS — Z780 Asymptomatic menopausal state: Secondary | ICD-10-CM

## 2023-05-31 MED ORDER — CLIMARA 0.025 MG/24HR TD PTWK
0.0250 mg | MEDICATED_PATCH | TRANSDERMAL | 2 refills | Status: DC
Start: 2023-05-31 — End: 2023-07-06

## 2023-06-02 ENCOUNTER — Telehealth (HOSPITAL_BASED_OUTPATIENT_CLINIC_OR_DEPARTMENT_OTHER): Payer: Self-pay | Admitting: Obstetrics & Gynecology

## 2023-06-02 NOTE — Telephone Encounter (Signed)
Called and spoke with patient and notified her of medication being sent into the pharmacy. Patient will set up an appointment to be seen in mid November. tbw

## 2023-06-02 NOTE — Telephone Encounter (Signed)
Called patient and schceulded annual patient has medicare .

## 2023-06-06 ENCOUNTER — Encounter: Payer: Self-pay | Admitting: Internal Medicine

## 2023-06-06 ENCOUNTER — Other Ambulatory Visit: Payer: Self-pay | Admitting: Internal Medicine

## 2023-06-08 ENCOUNTER — Other Ambulatory Visit: Payer: Self-pay | Admitting: Internal Medicine

## 2023-06-08 DIAGNOSIS — R5381 Other malaise: Secondary | ICD-10-CM

## 2023-06-14 ENCOUNTER — Other Ambulatory Visit: Payer: Self-pay | Admitting: Internal Medicine

## 2023-06-14 DIAGNOSIS — R918 Other nonspecific abnormal finding of lung field: Secondary | ICD-10-CM | POA: Diagnosis not present

## 2023-06-14 DIAGNOSIS — E559 Vitamin D deficiency, unspecified: Secondary | ICD-10-CM

## 2023-06-14 DIAGNOSIS — R5381 Other malaise: Secondary | ICD-10-CM

## 2023-06-18 ENCOUNTER — Ambulatory Visit
Admission: RE | Admit: 2023-06-18 | Discharge: 2023-06-18 | Disposition: A | Discharge status: Home / Self Care | Payer: Medicare HMO | Source: Ambulatory Visit | Attending: Internal Medicine | Admitting: Internal Medicine

## 2023-06-18 DIAGNOSIS — N958 Other specified menopausal and perimenopausal disorders: Secondary | ICD-10-CM | POA: Diagnosis not present

## 2023-06-18 DIAGNOSIS — E2839 Other primary ovarian failure: Secondary | ICD-10-CM | POA: Diagnosis not present

## 2023-06-18 DIAGNOSIS — R5381 Other malaise: Secondary | ICD-10-CM

## 2023-06-18 DIAGNOSIS — M8588 Other specified disorders of bone density and structure, other site: Secondary | ICD-10-CM | POA: Diagnosis not present

## 2023-06-18 DIAGNOSIS — E559 Vitamin D deficiency, unspecified: Secondary | ICD-10-CM

## 2023-06-22 ENCOUNTER — Other Ambulatory Visit: Payer: Self-pay | Admitting: Obstetrics & Gynecology

## 2023-06-22 DIAGNOSIS — Z0000 Encounter for general adult medical examination without abnormal findings: Secondary | ICD-10-CM

## 2023-06-23 DIAGNOSIS — E785 Hyperlipidemia, unspecified: Secondary | ICD-10-CM | POA: Diagnosis not present

## 2023-06-23 DIAGNOSIS — E559 Vitamin D deficiency, unspecified: Secondary | ICD-10-CM | POA: Diagnosis not present

## 2023-06-23 DIAGNOSIS — I10 Essential (primary) hypertension: Secondary | ICD-10-CM | POA: Diagnosis not present

## 2023-06-23 DIAGNOSIS — E041 Nontoxic single thyroid nodule: Secondary | ICD-10-CM | POA: Diagnosis not present

## 2023-06-30 DIAGNOSIS — Z0000 Encounter for general adult medical examination without abnormal findings: Secondary | ICD-10-CM | POA: Diagnosis not present

## 2023-06-30 DIAGNOSIS — I10 Essential (primary) hypertension: Secondary | ICD-10-CM | POA: Diagnosis not present

## 2023-06-30 DIAGNOSIS — Z1331 Encounter for screening for depression: Secondary | ICD-10-CM | POA: Diagnosis not present

## 2023-06-30 DIAGNOSIS — E785 Hyperlipidemia, unspecified: Secondary | ICD-10-CM | POA: Diagnosis not present

## 2023-06-30 DIAGNOSIS — J439 Emphysema, unspecified: Secondary | ICD-10-CM | POA: Diagnosis not present

## 2023-06-30 DIAGNOSIS — R82998 Other abnormal findings in urine: Secondary | ICD-10-CM | POA: Diagnosis not present

## 2023-06-30 DIAGNOSIS — I4510 Unspecified right bundle-branch block: Secondary | ICD-10-CM | POA: Diagnosis not present

## 2023-06-30 DIAGNOSIS — Z1339 Encounter for screening examination for other mental health and behavioral disorders: Secondary | ICD-10-CM | POA: Diagnosis not present

## 2023-06-30 DIAGNOSIS — E663 Overweight: Secondary | ICD-10-CM | POA: Diagnosis not present

## 2023-06-30 DIAGNOSIS — C3492 Malignant neoplasm of unspecified part of left bronchus or lung: Secondary | ICD-10-CM | POA: Diagnosis not present

## 2023-06-30 DIAGNOSIS — I2584 Coronary atherosclerosis due to calcified coronary lesion: Secondary | ICD-10-CM | POA: Diagnosis not present

## 2023-06-30 DIAGNOSIS — I700 Atherosclerosis of aorta: Secondary | ICD-10-CM | POA: Diagnosis not present

## 2023-06-30 DIAGNOSIS — N2889 Other specified disorders of kidney and ureter: Secondary | ICD-10-CM | POA: Diagnosis not present

## 2023-07-06 ENCOUNTER — Other Ambulatory Visit (HOSPITAL_COMMUNITY)
Admission: RE | Admit: 2023-07-06 | Discharge: 2023-07-06 | Disposition: A | Discharge status: Home / Self Care | Payer: Medicare HMO | Source: Ambulatory Visit | Attending: Obstetrics & Gynecology | Admitting: Obstetrics & Gynecology

## 2023-07-06 ENCOUNTER — Encounter (HOSPITAL_BASED_OUTPATIENT_CLINIC_OR_DEPARTMENT_OTHER): Payer: Self-pay | Admitting: Obstetrics & Gynecology

## 2023-07-06 ENCOUNTER — Ambulatory Visit (HOSPITAL_BASED_OUTPATIENT_CLINIC_OR_DEPARTMENT_OTHER): Payer: Medicare HMO | Admitting: Obstetrics & Gynecology

## 2023-07-06 VITALS — BP 154/64 | HR 68 | Ht 60.0 in | Wt 142.2 lb

## 2023-07-06 DIAGNOSIS — Z79890 Hormone replacement therapy (postmenopausal): Secondary | ICD-10-CM

## 2023-07-06 DIAGNOSIS — Z124 Encounter for screening for malignant neoplasm of cervix: Secondary | ICD-10-CM

## 2023-07-06 DIAGNOSIS — B009 Herpesviral infection, unspecified: Secondary | ICD-10-CM

## 2023-07-06 DIAGNOSIS — Z9189 Other specified personal risk factors, not elsewhere classified: Secondary | ICD-10-CM | POA: Diagnosis not present

## 2023-07-06 DIAGNOSIS — M8589 Other specified disorders of bone density and structure, multiple sites: Secondary | ICD-10-CM

## 2023-07-06 DIAGNOSIS — L292 Pruritus vulvae: Secondary | ICD-10-CM

## 2023-07-06 MED ORDER — TRIAMCINOLONE ACETONIDE 0.5 % EX OINT
1.0000 | TOPICAL_OINTMENT | Freq: Two times a day (BID) | CUTANEOUS | 0 refills | Status: DC
Start: 2023-07-06 — End: 2023-08-23

## 2023-07-06 MED ORDER — ESTRADIOL 0.025 MG/24HR TD PTTW: 1.0000 | MEDICATED_PATCH | TRANSDERMAL | 3 refills | Status: DC

## 2023-07-06 MED ORDER — PROMETRIUM 100 MG PO CAPS
100.0000 mg | ORAL_CAPSULE | Freq: Every day | ORAL | 4 refills | Status: DC
Start: 2023-07-06 — End: 2023-08-23

## 2023-07-06 NOTE — Progress Notes (Signed)
87 y.o. G17P1011 Divorced White or Caucasian female here for breast and pelvic exam.  I am also following her for HRT use.  She does not want stop this.  She is scheduled for bronchoscopy at [facility] on 08/03/2023.  Have asked her to check with pulmonologist about stopping this around the time of the procedure.    Does have hx of osteopenia and just had this done on 06/18/2023.  Reviewed today with pt.    Denies vaginal bleeding.  Patient's last menstrual period was 08/16/1993.          Sexually active: Yes.    H/O STD:  no  Health Maintenance: PCP:  Dr. Timothy Lasso.  Last wellness appt was in November.  Did blood work at that appt:  yes Vaccines are up to date:  yes, except flu shot which she had reaction to in the past Colonoscopy:  07/27/2019 at [facility] MMG:  08/02/2022 Negative BMD:  06/18/2023 Last pap smear:  07/13/2021 Negative.   H/o abnormal pap smear:  h/o LEEP    reports that she quit smoking about 60 years ago. Her smoking use included cigarettes. She has never used smokeless tobacco. She reports current alcohol use of about 5.0 standard drinks of alcohol per week. She reports that she does not use drugs.  Past Medical History:  Diagnosis Date   Anemia    secondary to taking omeprazole-on iron   Cancer (HCC) 08/2020   lung   Diverticulosis 09/2002   Dysrhythmia    a-fib after surgery 09/04/20. treated with Metoprolol for 6 week   GERD (gastroesophageal reflux disease)    Hyperlipidemia    Hypertension    Lung nodule    Osteopenia    Post traumatic stress disorder    STD (sexually transmitted disease)    HSV    Past Surgical History:  Procedure Laterality Date   APPENDECTOMY     CERVICAL BIOPSY  W/ LOOP ELECTRODE EXCISION     CIN1   COLPOSCOPY     ESOPHAGEAL DILATION  08/17/2011   FACIAL COSMETIC SURGERY  04/16/2002   KNEE SURGERY     LUNG REMOVAL, PARTIAL Left 08/2020   no chemo or radiation needed   TONSILLECTOMY     TOTAL KNEE ARTHROPLASTY Left 06/08/2021    Procedure: TOTAL KNEE ARTHROPLASTY;  Surgeon: Ollen Gross, MD;  Location: [facility];  Service: Orthopedics;  Laterality: Left;    Current Outpatient Medications  Medication Sig Dispense Refill   Cholecalciferol 125 MCG (5000 UT) TABS Take 1 tablet by mouth daily.     CLIMARA 0.025 MG/24HR patch Place 1 patch (0.025 mg total) onto the skin once a week. 4 patch 2   EPIPEN 2-PAK 0.3 MG/0.3ML SOAJ injection Inject 0.3 mg into the muscle as needed for anaphylaxis Chartered certified accountant).     fexofenadine (ALLEGRA) 180 MG tablet Take 180 mg by mouth daily as needed for allergies.     hydrochlorothiazide (MICROZIDE) 12.5 MG capsule Take 12.5 mg by mouth daily.     losartan (COZAAR) 50 MG tablet Take 1 tablet (50 mg total) by mouth daily. 90 tablet 3   nitroGLYCERIN (NITROSTAT) 0.4 MG SL tablet Place 1 tablet (0.4 mg total) under the tongue every 5 (five) minutes as needed for chest pain. 30 tablet 0   Omega-3 1000 MG CAPS Take 1 capsule by mouth daily at 6 (six) AM.     omeprazole (PRILOSEC) 20 MG capsule Take 20 mg by mouth every other day.     PROMETRIUM  100 MG capsule Take 1 capsule (100 mg total) by mouth daily. 90 capsule 4   tretinoin (RETIN-A) 0.025 % cream Apply 1 application topically at bedtime.      vitamin k 100 MCG tablet Take 100 mcg by mouth daily. Take after taking vitamin D3.     No current facility-administered medications for this visit.    Family History  Problem Relation Age of Onset   COPD Mother    Sudden death Father 11   Healthy Brother     Review of Systems  Constitutional: Negative.   Genitourinary: Negative.     Exam:   BP (!) 154/64 (BP Location: Right Arm, Patient Position: Sitting, Cuff Size: Normal)   Pulse 68   Ht 5' (1.524 m)   Wt 142 lb 3.2 oz (64.5 kg)   LMP 08/16/1993   BMI 27.77 kg/m   Height: 5' (152.4 cm)  General appearance: alert, cooperative and appears stated age Breasts: normal appearance, no masses or tenderness Abdomen: soft, non-tender; bowel  sounds normal; no masses,  no organomegaly Lymph nodes: Cervical, supraclavicular, and axillary nodes normal.  No abnormal inguinal nodes palpated Neurologic: Grossly normal  Pelvic: External genitalia:  no lesions              Urethra:  normal appearing urethra with no masses, tenderness or lesions              Bartholins and Skenes: normal                 Vagina: normal appearing vagina with atrophic changes and no discharge, no lesions              Cervix: no lesions              Pap taken: Yes.   Bimanual Exam:  Uterus:  normal size, contour, position, consistency, mobility, non-tender              Adnexa: normal adnexa and no mass, fullness, tenderness               Rectovaginal: Confirms               Anus:  normal sphincter tone, no lesions  Chaperone, Ina Homes, CMA, was present for exam.  Assessment/Plan: 1. GYN exam for high-risk Medicare patient - Pap smear 07/13/2021.  Obtained today per pt request.  Guidelines were discussed. - Mammogram 08/02/2022 - Colonoscopy 07/27/2019 at [facility] - Bone mineral density 06/18/2023 - lab work done with PCP, Dr. Timothy Lasso - vaccines reviewed/updated   2. Hormone replacement therapy (HRT) - estradiol (VIVELLE-DOT) 0.025 MG/24HR; Place 1 patch onto the skin 2 (two) times a week.  Dispense: 24 patch; Refill: 3 - PROMETRIUM 100 MG capsule; Take 1 capsule (100 mg total) by mouth daily.  Dispense: 90 capsule; Refill: 4  3. Cervical cancer screening - Cytology - PAP( [facility]) - PR OBTAINING SCREEN PAP SMEAR  4. Herpes simplex - does not need valtrex rx today  5. Osteopenia of multiple sites

## 2023-07-11 LAB — CYTOLOGY - PAP: Diagnosis: NEGATIVE

## 2023-07-26 DIAGNOSIS — M47814 Spondylosis without myelopathy or radiculopathy, thoracic region: Secondary | ICD-10-CM | POA: Diagnosis not present

## 2023-07-26 DIAGNOSIS — R918 Other nonspecific abnormal finding of lung field: Secondary | ICD-10-CM | POA: Diagnosis not present

## 2023-07-26 DIAGNOSIS — K449 Diaphragmatic hernia without obstruction or gangrene: Secondary | ICD-10-CM | POA: Diagnosis not present

## 2023-07-26 DIAGNOSIS — J219 Acute bronchiolitis, unspecified: Secondary | ICD-10-CM | POA: Diagnosis not present

## 2023-07-26 DIAGNOSIS — I2510 Atherosclerotic heart disease of native coronary artery without angina pectoris: Secondary | ICD-10-CM | POA: Diagnosis not present

## 2023-07-26 DIAGNOSIS — R911 Solitary pulmonary nodule: Secondary | ICD-10-CM | POA: Diagnosis not present

## 2023-07-26 DIAGNOSIS — J432 Centrilobular emphysema: Secondary | ICD-10-CM | POA: Diagnosis not present

## 2023-08-03 DIAGNOSIS — E785 Hyperlipidemia, unspecified: Secondary | ICD-10-CM | POA: Diagnosis not present

## 2023-08-03 DIAGNOSIS — I10 Essential (primary) hypertension: Secondary | ICD-10-CM | POA: Diagnosis not present

## 2023-08-03 DIAGNOSIS — Z4682 Encounter for fitting and adjustment of non-vascular catheter: Secondary | ICD-10-CM | POA: Diagnosis not present

## 2023-08-03 DIAGNOSIS — K219 Gastro-esophageal reflux disease without esophagitis: Secondary | ICD-10-CM | POA: Diagnosis not present

## 2023-08-03 DIAGNOSIS — R9389 Abnormal findings on diagnostic imaging of other specified body structures: Secondary | ICD-10-CM | POA: Diagnosis not present

## 2023-08-03 DIAGNOSIS — R918 Other nonspecific abnormal finding of lung field: Secondary | ICD-10-CM | POA: Diagnosis not present

## 2023-08-03 DIAGNOSIS — Z9889 Other specified postprocedural states: Secondary | ICD-10-CM | POA: Diagnosis not present

## 2023-08-03 DIAGNOSIS — K449 Diaphragmatic hernia without obstruction or gangrene: Secondary | ICD-10-CM | POA: Diagnosis not present

## 2023-08-03 DIAGNOSIS — Z79899 Other long term (current) drug therapy: Secondary | ICD-10-CM | POA: Diagnosis not present

## 2023-08-03 DIAGNOSIS — R1310 Dysphagia, unspecified: Secondary | ICD-10-CM | POA: Diagnosis not present

## 2023-08-03 DIAGNOSIS — R911 Solitary pulmonary nodule: Secondary | ICD-10-CM | POA: Diagnosis not present

## 2023-08-03 DIAGNOSIS — Z881 Allergy status to other antibiotic agents status: Secondary | ICD-10-CM | POA: Diagnosis not present

## 2023-08-03 DIAGNOSIS — Z85118 Personal history of other malignant neoplasm of bronchus and lung: Secondary | ICD-10-CM | POA: Diagnosis not present

## 2023-08-05 ENCOUNTER — Ambulatory Visit
Admission: RE | Admit: 2023-08-05 | Discharge: 2023-08-05 | Disposition: A | Discharge status: Home / Self Care | Payer: Medicare HMO | Source: Ambulatory Visit | Attending: Obstetrics & Gynecology | Admitting: Obstetrics & Gynecology

## 2023-08-05 DIAGNOSIS — Z0000 Encounter for general adult medical examination without abnormal findings: Secondary | ICD-10-CM

## 2023-08-05 DIAGNOSIS — Z1231 Encounter for screening mammogram for malignant neoplasm of breast: Secondary | ICD-10-CM | POA: Diagnosis not present

## 2023-08-23 ENCOUNTER — Encounter: Payer: Self-pay | Admitting: Cardiology

## 2023-08-23 ENCOUNTER — Ambulatory Visit: Payer: Medicare HMO | Attending: Cardiology | Admitting: Cardiology

## 2023-08-23 VITALS — BP 140/60 | HR 67 | Ht 60.0 in | Wt 144.0 lb

## 2023-08-23 DIAGNOSIS — I10 Essential (primary) hypertension: Secondary | ICD-10-CM | POA: Diagnosis not present

## 2023-08-23 DIAGNOSIS — E782 Mixed hyperlipidemia: Secondary | ICD-10-CM | POA: Diagnosis not present

## 2023-08-23 DIAGNOSIS — R0789 Other chest pain: Secondary | ICD-10-CM

## 2023-08-23 DIAGNOSIS — I2510 Atherosclerotic heart disease of native coronary artery without angina pectoris: Secondary | ICD-10-CM

## 2023-08-23 MED ORDER — LOSARTAN POTASSIUM 25 MG PO TABS: 75.0000 mg | ORAL_TABLET | Freq: Every day | ORAL | Status: DC

## 2023-08-23 NOTE — Patient Instructions (Signed)
 Medication Instructions:   INCREASE LOSARTAN  TO 75 MG ONCE DAILY= 3 OF THE 25 MG TABLETS ONCE DAILY  *If you need a refill on your cardiac medications before your next appointment, please call your pharmacy*   Follow-Up: At [facility], you and your health needs are our priority.  As part of our continuing mission to provide you with exceptional heart care, we have created designated Provider Care Teams.  These Care Teams include your primary Cardiologist (physician) and Advanced Practice Providers (APPs -  Physician Assistants and Nurse Practitioners) who all work together to provide you with the care you need, when you need it.  We recommend signing up for the patient portal called MyChart.  Sign up information is provided on this After Visit Summary.  MyChart is used to connect with patients for Virtual Visits (Telemedicine).  Patients are able to view lab/test results, encounter notes, upcoming appointments, etc.  Non-urgent messages can be sent to your provider as well.   To learn more about what you can do with MyChart, go to [email or web address].    Your next appointment:   12 month(s)  Provider:   Redell Shallow, MD

## 2023-08-23 NOTE — Progress Notes (Signed)
 HPI: Follow-up atrial fibrillation, coronary artery disease, hypertension.  Previously followed by Dr. Claudene but transitioning to me.  Patient has a history of lung cancer status post left upper lobe posterior segmentectomy in January 2022, nonobstructive coronary disease by CTA, postoperative atrial fibrillation.  Echocardiogram February 2011 showed normal LV function, grade 1 diastolic dysfunction, mild mitral regurgitation.  Coronary CTA January 2021 showed minimal plaque in the right coronary artery, mild proximal to mid LAD stenosis, minimal plaque in the circumflex and calcium score 97.2 which is 43rd percentile.  There was note of opacity in the right lower lobe and follow-up recommended 6 to 12 months.  Nuclear study August 2022 showed ejection fraction 86% and normal perfusion.  Patient is followed by pulmonary at [facility] and underwent bronchoscopy with transbronchial needle aspiration of right lower lobe nodule recently; pathology showed no malignancy identified.  Since last seen she denies dyspnea on exertion, orthopnea, PND, pedal edema, chest pain or syncope.  Current Outpatient Medications  Medication Sig Dispense Refill   Ascorbic Acid (VITAMIN C) 500 MG CAPS Take 1 Capful by mouth daily.     Cholecalciferol 125 MCG (5000 UT) TABS Take 1 tablet by mouth daily.     Coenzyme Q10 (CO Q 10 PO) Take 1 tablet by mouth daily.     EPIPEN 2-PAK 0.3 MG/0.3ML SOAJ injection Inject 0.3 mg into the muscle as needed for anaphylaxis Chartered Certified [employer]ant).     estradiol  (VIVELLE -DOT) 0.025 MG/24HR Place 1 patch onto the skin 2 (two) times a week. 24 patch 3   fexofenadine (ALLEGRA) 180 MG tablet Take 180 mg by mouth daily as needed for allergies.     Grape Seed Extract 100 MG CAPS Take 1 capsule by mouth daily.     hydrochlorothiazide  (MICROZIDE ) 12.5 MG capsule Take 12.5 mg by mouth daily.     losartan  (COZAAR ) 50 MG tablet Take 1 tablet (50 mg total) by mouth daily. 90 tablet 3   MAGNESIUM GLYCINATE PO Take  1 tablet by mouth daily.     Multiple Vitamin (MULTIVITAMIN) capsule Take 1 capsule by mouth daily.     nitroGLYCERIN  (NITROSTAT ) 0.4 MG SL tablet Place 1 tablet (0.4 mg total) under the tongue every 5 (five) minutes as needed for chest pain. 30 tablet 0   Omega-3 1000 MG CAPS Take 1 capsule by mouth daily at 6 (six) AM.     Omega-3 Fatty Acids (OMEGA-3 FISH OIL PO) Take 1,600 mg by mouth daily.     omeprazole (PRILOSEC) 20 MG capsule Take 20 mg by mouth every other day.     progesterone  (PROMETRIUM ) 100 MG capsule Take 100 mg by mouth daily.     pyridoxine (B-6) 100 MG tablet Take 100 mg by mouth daily.     tretinoin (RETIN-A) 0.025 % cream Apply 1 application topically at bedtime.      vitamin k 100 MCG tablet Take 100 mcg by mouth daily. Take after taking vitamin D3.     Zinc 50 MG TABS Take 1 tablet by mouth daily.     No current facility-administered medications for this visit.     Past Medical History:  Diagnosis Date   Anemia    secondary to taking omeprazole-on iron   Cancer (HCC) 08/2020   lung   Diverticulosis 09/2002   Dysrhythmia    a-fib after surgery 09/04/20. treated with Metoprolol  for 6 week   GERD (gastroesophageal reflux disease)    Hyperlipidemia    Hypertension  Lung nodule    Osteopenia    Post traumatic stress disorder    STD (sexually transmitted disease)    HSV    Past Surgical History:  Procedure Laterality Date   APPENDECTOMY     CERVICAL BIOPSY  W/ LOOP ELECTRODE EXCISION     CIN1   COLPOSCOPY     ESOPHAGEAL DILATION  08/17/2011   FACIAL COSMETIC SURGERY  04/16/2002   KNEE SURGERY     LUNG REMOVAL, PARTIAL Left 08/2020   no chemo or radiation needed   TONSILLECTOMY     TOTAL KNEE ARTHROPLASTY Left 06/08/2021   Procedure: TOTAL KNEE ARTHROPLASTY;  Surgeon: Melodi Lerner, MD;  Location: [facility];  Service: Orthopedics;  Laterality: Left;    Social History   Socioeconomic History   Marital status: Divorced    Spouse name: Not on file    Number of children: Not on file   Years of education: Not on file   Highest education level: Not on file  Occupational History   Not on file  Tobacco Use   Smoking status: Former    Current packs/day: 0.00    Types: Cigarettes    Quit date: 02/21/1963    Years since quitting: 60.5   Smokeless tobacco: Never   Tobacco comments:    in college  Vaping Use   Vaping status: Never Used  Substance and Sexual Activity   Alcohol  use: Yes    Alcohol /week: 5.0 standard drinks of alcohol     Types: 5 Standard drinks or equivalent per week   Drug use: No   Sexual activity: Yes    Partners: Male    Birth control/protection: Post-menopausal  Other Topics Concern   Not on file  Social History Narrative   Not on file   Social Drivers of Health   Financial Resource Strain: Low Risk  (09/05/2020)   [facility], [facility]   Overall Financial Resource Strain (CARDIA)    Difficulty of Paying Living Expenses: Not very hard  Food Insecurity: No Food Insecurity (04/22/2023)   Hunger Vital Sign    Worried About Running Out of Food in the Last Year: Never true    Ran Out of Food in the Last Year: Never true  Transportation Needs: No Transportation Needs (04/22/2023)   PRAPARE - Administrator, Civil Service (Medical): No    Lack of Transportation (Non-Medical): No  Physical Activity: Not on file  Stress: Not on file  Social Connections: Not on file  Intimate Partner Violence: Not At Risk (04/22/2023)   Humiliation, Afraid, Rape, and Kick questionnaire    Fear of Current or Ex-Partner: No    Emotionally Abused: No    Physically Abused: No    Sexually Abused: No    Family History  Problem Relation Age of Onset   COPD Mother    Sudden death Father 85   Healthy Brother     ROS: no fevers or chills, productive cough, hemoptysis, dysphasia, odynophagia, melena, hematochezia, dysuria, hematuria, rash, seizure activity, orthopnea, PND, pedal edema, claudication.  Remaining systems are negative.  Physical Exam: Well-developed well-nourished in no acute distress.  Skin is warm and dry.  HEENT is normal.  Neck is supple.  Chest is clear to auscultation with normal expansion.  Cardiovascular exam is regular rate and rhythm.  Abdominal exam nontender or distended. No masses palpated. Extremities show no edema. neuro grossly intact  EKG Interpretation Date/Time:  Tuesday August 23 2023 16:31:59 EST Ventricular Rate:  67 PR Interval:  150 QRS Duration:  124 QT Interval:  430 QTC Calculation: 454 R Axis:   59  Text Interpretation: Normal sinus rhythm Right bundle branch block When compared with ECG of 11-Mar-2021 20:43, Right bundle branch block is now Present Confirmed by Pietro Rogue ([id number]) on 08/23/2023 4:38:02 PM    A/P  1 history of atrial fibrillation-this occurred following her lung surgery and there is been no documented recurrences.  2 history of chest pain-no recurrences.  3 coronary artery disease-mild on previous CTA.  No chest pain.  4 hyperlipidemia-we discussed a statin today but she would prefer to avoid medications if possible.  5 hypertension-blood pressure is elevated.  Increase losartan  to 75 mg daily and follow.  Can advance to 100 mg if necessary.  Rogue Pietro, MD

## 2023-09-16 DIAGNOSIS — J929 Pleural plaque without asbestos: Secondary | ICD-10-CM | POA: Diagnosis not present

## 2023-09-16 DIAGNOSIS — Z08 Encounter for follow-up examination after completed treatment for malignant neoplasm: Secondary | ICD-10-CM | POA: Diagnosis not present

## 2023-09-16 DIAGNOSIS — R911 Solitary pulmonary nodule: Secondary | ICD-10-CM | POA: Diagnosis not present

## 2023-09-16 DIAGNOSIS — C3492 Malignant neoplasm of unspecified part of left bronchus or lung: Secondary | ICD-10-CM | POA: Diagnosis not present

## 2023-09-16 DIAGNOSIS — Z9889 Other specified postprocedural states: Secondary | ICD-10-CM | POA: Diagnosis not present

## 2023-09-16 DIAGNOSIS — Z85118 Personal history of other malignant neoplasm of bronchus and lung: Secondary | ICD-10-CM | POA: Diagnosis not present

## 2023-09-16 DIAGNOSIS — R918 Other nonspecific abnormal finding of lung field: Secondary | ICD-10-CM | POA: Diagnosis not present

## 2023-09-28 DIAGNOSIS — M79671 Pain in right foot: Secondary | ICD-10-CM | POA: Diagnosis not present

## 2023-10-31 ENCOUNTER — Ambulatory Visit: Payer: Medicare HMO | Admitting: Cardiology

## 2023-11-14 DIAGNOSIS — L858 Other specified epidermal thickening: Secondary | ICD-10-CM | POA: Diagnosis not present

## 2023-11-14 DIAGNOSIS — D1801 Hemangioma of skin and subcutaneous tissue: Secondary | ICD-10-CM | POA: Diagnosis not present

## 2023-11-14 DIAGNOSIS — L720 Epidermal cyst: Secondary | ICD-10-CM | POA: Diagnosis not present

## 2023-11-14 DIAGNOSIS — L718 Other rosacea: Secondary | ICD-10-CM | POA: Diagnosis not present

## 2023-11-14 DIAGNOSIS — I788 Other diseases of capillaries: Secondary | ICD-10-CM | POA: Diagnosis not present

## 2023-11-14 DIAGNOSIS — L821 Other seborrheic keratosis: Secondary | ICD-10-CM | POA: Diagnosis not present

## 2023-11-14 DIAGNOSIS — L814 Other melanin hyperpigmentation: Secondary | ICD-10-CM | POA: Diagnosis not present

## 2023-11-15 DIAGNOSIS — J439 Emphysema, unspecified: Secondary | ICD-10-CM | POA: Diagnosis not present

## 2023-11-15 DIAGNOSIS — J209 Acute bronchitis, unspecified: Secondary | ICD-10-CM | POA: Diagnosis not present

## 2023-11-15 DIAGNOSIS — R058 Other specified cough: Secondary | ICD-10-CM | POA: Diagnosis not present

## 2023-11-25 ENCOUNTER — Ambulatory Visit: Payer: Self-pay | Admitting: Student in an Organized Health Care Education/Training Program

## 2023-11-25 NOTE — Telephone Encounter (Signed)
 Chief Complaint: chest congestion Symptoms: productive cough Frequency: 3 weeks Pertinent Negatives: Patient denies fever, CP, SOB Disposition: [] ED /[] Urgent Care (no appt availability in office) / [] Appointment(In office/virtual)/ []  [facility]/ [] Home Care/ [] Refused Recommended Disposition /[]  Mobile Bus/ [x]  Follow-up with PCP Additional Notes: Pt c/o chest congestion and productive green cough x 3 weeks. PT reports recently seeing PCP with CXR and was dx with bronchitis and given OTC recs as well as abx that she is picking up from pharmacy today. Pt has hx of lung nodule and primary surgical team is [facility]. Pt was previously seen by Dr. Tonia Brooms and requesting TOC to Dr. Aundria Rud. Triager scheduled TOC appt, and strongly advised pt to follow PCP tx/guidance for current acute sx since [facility] did not have acute access. Patient verbalized understanding and to call PCP with worsening symptoms.    Copied from CRM [id number]. Topic: Clinical - Red Word Triage >> Nov 25, 2023 12:38 PM Para March B wrote: [facility] that prompted transfer to Nurse Triage: over 2 weeks patient has experienced SOB. States she is sick and has a lung noodle and cannot wait. Reason for Disposition  Cough has been present for > 3 weeks  Answer Assessment - Initial Assessment Questions E2C2 Pulmonary Triage - Initial Assessment Questions "Chief Complaint (e.g., cough, sob, wheezing, fever, chills, sweat or additional symptoms) *Go to specific symptom protocol after initial questions. Congestion, sore scratchy throat - concern for PNA Reports going to PCP recently - reports CXR done and dx with "bronchitis" and congestion. Was doing OTC and INH and initially had some improvement, and now coming back 2 days ago.  Reports Jan 2022 had robotic surgery r/t lung nodules and removed LUL?  "How long have symptoms been present?" 2 weeks ago  Have you tested for COVID or Flu? Note: If not, ask patient if a  home test can be taken. If so, instruct patient to call back for positive results. No  MEDICINES:   "Have you used any OTC meds to help with symptoms?" Yes If yes, ask "What medications?" Mucinex DM Allegra Nasal spray  "Have you used your inhalers/maintenance medication?" Yes If yes, "What medications?" Albuterol INH PRN - uses mainly at night when going to sleep  If inhaler, ask "How many puffs and how often?" Note: Review instructions on medication in the chart. See above  OXYGEN: "Do you wear supplemental oxygen?" No If yes, "How many liters are you supposed to use?" N/a  "Do you monitor your oxygen levels?" No If yes, "What is your reading (oxygen level) today?" "But I have a finger monitor" - pt reports not turning on   "What is your usual oxygen saturation reading?"  (Note: Pulmonary O2 sats should be 90% or greater) Reports normally  >95%     2. ONSET: "When did the sinus pain start?"  (e.g., hours, days)      Came back 2 days ago 3. SEVERITY: "How bad is the pain?"   (Scale 1-10; mild, moderate or severe)   - MILD (1-3): doesn't interfere with normal activities    - MODERATE (4-7): interferes with normal activities (e.g., work or school) or awakens from sleep   - SEVERE (8-10): excruciating pain and patient unable to do any normal activities        denies 4. RECURRENT SYMPTOM: "Have you ever had sinus problems before?" If Yes, ask: "When was the last time?" and "What happened that time?"      no 5. NASAL  CONGESTION: "Is the nose blocked?" If Yes, ask: "Can you open it or must you breathe through your mouth?"     Chest congestion, denies nose congestion 6. NASAL DISCHARGE: "Do you have discharge from your nose?" If so ask, "What color?"     Productive light green cough 7. FEVER: "Do you have a fever?" If Yes, ask: "What is it, how was it measured, and when did it start?"      denies 8. OTHER SYMPTOMS: "Do you have any other symptoms?" (e.g., sore throat, cough,  earache, difficulty breathing)     denies  Protocols used: Sinus Pain or Congestion-A-AH, Cough - Acute Productive-A-AH

## 2023-12-08 ENCOUNTER — Other Ambulatory Visit: Payer: Self-pay | Admitting: Cardiology

## 2023-12-08 DIAGNOSIS — R911 Solitary pulmonary nodule: Secondary | ICD-10-CM | POA: Diagnosis not present

## 2023-12-08 DIAGNOSIS — J432 Centrilobular emphysema: Secondary | ICD-10-CM | POA: Diagnosis not present

## 2023-12-08 DIAGNOSIS — C3492 Malignant neoplasm of unspecified part of left bronchus or lung: Secondary | ICD-10-CM | POA: Diagnosis not present

## 2023-12-08 MED ORDER — LOSARTAN POTASSIUM 25 MG PO TABS: 75.0000 mg | ORAL_TABLET | Freq: Every day | ORAL | 3 refills | Status: DC

## 2023-12-08 NOTE — Telephone Encounter (Signed)
 Pt is requesting a refill on medication losartan . Dr. Audery Blazing did not prescribe this medication. Would Dr. Audery Blazing like to refill this medication? Please address

## 2023-12-08 NOTE — Telephone Encounter (Signed)
 From Office Visit 08/23/23:  5 hypertension-blood pressure is elevated.  Increase losartan  to 75 mg daily and follow.  Can advance to 100 mg if necessary.   Alexandria Angel, MD

## 2023-12-08 NOTE — Telephone Encounter (Signed)
*  STAT* If patient is at the pharmacy, call can be transferred to refill team.   1. Which medications need to be refilled? (please list name of each medication and dose if known) new prescription Losartan  Potassium 25 mg 3 a day   2. Would you like to learn more about the convenience, safety, & potential cost savings by using the [facility]?      3. Are you open to using the [facility] (Type [facility].    4. Which pharmacy/location (including street and city if local pharmacy) is medication to be sent to? [facility] [address] [place]   5. Do they need a 30 day or 90 day supply? 90 days # 270 and refills

## 2023-12-12 DIAGNOSIS — M2041 Other hammer toe(s) (acquired), right foot: Secondary | ICD-10-CM | POA: Diagnosis not present

## 2023-12-12 DIAGNOSIS — M7741 Metatarsalgia, right foot: Secondary | ICD-10-CM | POA: Diagnosis not present

## 2023-12-12 DIAGNOSIS — Q828 Other specified congenital malformations of skin: Secondary | ICD-10-CM | POA: Diagnosis not present

## 2023-12-12 DIAGNOSIS — R2681 Unsteadiness on feet: Secondary | ICD-10-CM | POA: Diagnosis not present

## 2023-12-13 DIAGNOSIS — R053 Chronic cough: Secondary | ICD-10-CM | POA: Diagnosis not present

## 2023-12-13 DIAGNOSIS — C3492 Malignant neoplasm of unspecified part of left bronchus or lung: Secondary | ICD-10-CM | POA: Diagnosis not present

## 2023-12-13 DIAGNOSIS — J439 Emphysema, unspecified: Secondary | ICD-10-CM | POA: Diagnosis not present

## 2023-12-13 DIAGNOSIS — J3489 Other specified disorders of nose and nasal sinuses: Secondary | ICD-10-CM | POA: Diagnosis not present

## 2023-12-13 DIAGNOSIS — R5383 Other fatigue: Secondary | ICD-10-CM | POA: Diagnosis not present

## 2023-12-13 DIAGNOSIS — Z1152 Encounter for screening for COVID-19: Secondary | ICD-10-CM | POA: Diagnosis not present

## 2023-12-21 ENCOUNTER — Encounter: Admitting: Student in an Organized Health Care Education/Training Program

## 2023-12-29 DIAGNOSIS — M8580 Other specified disorders of bone density and structure, unspecified site: Secondary | ICD-10-CM | POA: Diagnosis not present

## 2023-12-29 DIAGNOSIS — E785 Hyperlipidemia, unspecified: Secondary | ICD-10-CM | POA: Diagnosis not present

## 2023-12-29 DIAGNOSIS — C3492 Malignant neoplasm of unspecified part of left bronchus or lung: Secondary | ICD-10-CM | POA: Diagnosis not present

## 2023-12-29 DIAGNOSIS — E041 Nontoxic single thyroid nodule: Secondary | ICD-10-CM | POA: Diagnosis not present

## 2023-12-29 DIAGNOSIS — N2889 Other specified disorders of kidney and ureter: Secondary | ICD-10-CM | POA: Diagnosis not present

## 2023-12-29 DIAGNOSIS — I2584 Coronary atherosclerosis due to calcified coronary lesion: Secondary | ICD-10-CM | POA: Diagnosis not present

## 2023-12-29 DIAGNOSIS — I700 Atherosclerosis of aorta: Secondary | ICD-10-CM | POA: Diagnosis not present

## 2023-12-29 DIAGNOSIS — E663 Overweight: Secondary | ICD-10-CM | POA: Diagnosis not present

## 2023-12-29 DIAGNOSIS — F419 Anxiety disorder, unspecified: Secondary | ICD-10-CM | POA: Diagnosis not present

## 2023-12-29 DIAGNOSIS — I10 Essential (primary) hypertension: Secondary | ICD-10-CM | POA: Diagnosis not present

## 2023-12-29 DIAGNOSIS — J439 Emphysema, unspecified: Secondary | ICD-10-CM | POA: Diagnosis not present

## 2024-01-02 DIAGNOSIS — M2041 Other hammer toe(s) (acquired), right foot: Secondary | ICD-10-CM | POA: Diagnosis not present

## 2024-01-03 DIAGNOSIS — E041 Nontoxic single thyroid nodule: Secondary | ICD-10-CM | POA: Diagnosis not present

## 2024-01-03 DIAGNOSIS — Z23 Encounter for immunization: Secondary | ICD-10-CM | POA: Diagnosis not present

## 2024-01-03 DIAGNOSIS — D649 Anemia, unspecified: Secondary | ICD-10-CM | POA: Diagnosis not present

## 2024-01-03 DIAGNOSIS — E559 Vitamin D deficiency, unspecified: Secondary | ICD-10-CM | POA: Diagnosis not present

## 2024-01-03 DIAGNOSIS — E785 Hyperlipidemia, unspecified: Secondary | ICD-10-CM | POA: Diagnosis not present

## 2024-01-03 DIAGNOSIS — I10 Essential (primary) hypertension: Secondary | ICD-10-CM | POA: Diagnosis not present

## 2024-01-04 DIAGNOSIS — Z961 Presence of intraocular lens: Secondary | ICD-10-CM | POA: Diagnosis not present

## 2024-01-12 DIAGNOSIS — R911 Solitary pulmonary nodule: Secondary | ICD-10-CM | POA: Diagnosis not present

## 2024-01-12 DIAGNOSIS — R918 Other nonspecific abnormal finding of lung field: Secondary | ICD-10-CM | POA: Diagnosis not present

## 2024-01-12 DIAGNOSIS — Z9889 Other specified postprocedural states: Secondary | ICD-10-CM | POA: Diagnosis not present

## 2024-01-12 DIAGNOSIS — K449 Diaphragmatic hernia without obstruction or gangrene: Secondary | ICD-10-CM | POA: Diagnosis not present

## 2024-01-12 DIAGNOSIS — J929 Pleural plaque without asbestos: Secondary | ICD-10-CM | POA: Diagnosis not present

## 2024-01-12 DIAGNOSIS — J432 Centrilobular emphysema: Secondary | ICD-10-CM | POA: Diagnosis not present

## 2024-01-12 DIAGNOSIS — Z85118 Personal history of other malignant neoplasm of bronchus and lung: Secondary | ICD-10-CM | POA: Diagnosis not present

## 2024-01-12 DIAGNOSIS — Z08 Encounter for follow-up examination after completed treatment for malignant neoplasm: Secondary | ICD-10-CM | POA: Diagnosis not present

## 2024-01-12 DIAGNOSIS — I2510 Atherosclerotic heart disease of native coronary artery without angina pectoris: Secondary | ICD-10-CM | POA: Diagnosis not present

## 2024-01-12 DIAGNOSIS — J219 Acute bronchiolitis, unspecified: Secondary | ICD-10-CM | POA: Diagnosis not present

## 2024-01-12 DIAGNOSIS — C3492 Malignant neoplasm of unspecified part of left bronchus or lung: Secondary | ICD-10-CM | POA: Diagnosis not present

## 2024-01-17 DIAGNOSIS — R918 Other nonspecific abnormal finding of lung field: Secondary | ICD-10-CM | POA: Diagnosis not present

## 2024-01-17 DIAGNOSIS — C3492 Malignant neoplasm of unspecified part of left bronchus or lung: Secondary | ICD-10-CM | POA: Diagnosis not present

## 2024-01-19 DIAGNOSIS — R911 Solitary pulmonary nodule: Secondary | ICD-10-CM | POA: Diagnosis not present

## 2024-01-19 DIAGNOSIS — C3412 Malignant neoplasm of upper lobe, left bronchus or lung: Secondary | ICD-10-CM | POA: Diagnosis not present

## 2024-01-19 DIAGNOSIS — C3492 Malignant neoplasm of unspecified part of left bronchus or lung: Secondary | ICD-10-CM | POA: Diagnosis not present

## 2024-01-19 DIAGNOSIS — Z87891 Personal history of nicotine dependence: Secondary | ICD-10-CM | POA: Diagnosis not present

## 2024-02-07 DIAGNOSIS — C3492 Malignant neoplasm of unspecified part of left bronchus or lung: Secondary | ICD-10-CM | POA: Diagnosis not present

## 2024-02-07 DIAGNOSIS — R911 Solitary pulmonary nodule: Secondary | ICD-10-CM | POA: Diagnosis not present

## 2024-02-07 DIAGNOSIS — J432 Centrilobular emphysema: Secondary | ICD-10-CM | POA: Diagnosis not present

## 2024-02-15 ENCOUNTER — Telehealth (HOSPITAL_COMMUNITY): Payer: Self-pay

## 2024-02-15 NOTE — Telephone Encounter (Signed)
 Pt insurance is active and benefits verified through [facility] Co-pay $30, DED 0/0 met, out of pocket $4,150/$827.28 met, co-insurance 0%. no pre-authorization required, Tori[employer] 02/15/2024@1 :04, REF# [id number]

## 2024-02-20 ENCOUNTER — Telehealth (HOSPITAL_COMMUNITY): Payer: Self-pay

## 2024-02-20 NOTE — Telephone Encounter (Signed)
 Received referral from Dr. Vicenta Lennert for this pt to participate in Pulmonary Rehab with the diagnosis of centrilobular emphysema and malignant neoplasm of unspecified part of left bronchus or lung. Clinical review of pt follow up appt on 02/07/24 Pulmonary office note. Pt appropriate for scheduling for Pulmonary rehab. Will forward to support staff for scheduling and verification of insurance eligibility/benefits with pt consent.   Ronal Levin, RN, BSN Cardiac and Pulmonary Rehab

## 2024-02-23 ENCOUNTER — Telehealth (HOSPITAL_COMMUNITY): Payer: Self-pay

## 2024-02-23 NOTE — Telephone Encounter (Signed)
 Called patient to see if she was interested in participating in the [facility]. Patient will come in for orientation on 7/28 and will attend the 1:15 exercise class.  Pensions consultant.

## 2024-02-23 NOTE — Telephone Encounter (Signed)
 Insurance re-verified by accident, different deductible given.  Pt insurance is active and benefits verified through [facility]. Co-pay $15, DED $0/$0 met, out of pocket $4,150/$857.28 met, co-insurance 0%. No pre-authorization required. 02/23/2024 @ 10:15am, spoke with Onita FALCON., REF# [id number].

## 2024-03-12 ENCOUNTER — Encounter (HOSPITAL_COMMUNITY): Payer: Self-pay

## 2024-03-12 ENCOUNTER — Encounter (HOSPITAL_COMMUNITY)
Admission: RE | Admit: 2024-03-12 | Discharge: 2024-03-12 | Disposition: A | Discharge status: Home / Self Care | Source: Ambulatory Visit | Attending: Pulmonary Disease | Admitting: Pulmonary Disease

## 2024-03-12 VITALS — BP 148/66 | HR 71 | Wt 144.0 lb

## 2024-03-12 DIAGNOSIS — J432 Centrilobular emphysema: Secondary | ICD-10-CM | POA: Diagnosis not present

## 2024-03-12 DIAGNOSIS — C3492 Malignant neoplasm of unspecified part of left bronchus or lung: Secondary | ICD-10-CM | POA: Diagnosis not present

## 2024-03-12 NOTE — Progress Notes (Signed)
 Pulmonary Individual Treatment Plan  Patient Details  Name: Marissa Jacobs MRN: [id number] Date of Birth: 1936-04-07 Referring Provider:   Conrad Ports Pulmonary Rehab Walk Test from 03/12/2024 in [facility]  Referring Provider Dr. Alaine    Initial Encounter Date:  Flowsheet Row Pulmonary Rehab Walk Test from 03/12/2024 in [facility]  Date 03/12/24    Visit Diagnosis: Centrilobular emphysema (HCC)  Malignant neoplasm of unspecified part of left bronchus or lung (HCC)  Patient's Home Medications on Admission:   Current Outpatient Medications:    Ascorbic Acid (VITAMIN C) 500 MG CAPS, Take 1 Capful by mouth daily., Disp: , Rfl:    Cholecalciferol 125 MCG (5000 UT) TABS, Take 1 tablet by mouth daily., Disp: , Rfl:    Cobalamin Combinations (B12 FOLATE) 800-800 MCG CAPS, 1 tablet Orally Once a day, Disp: , Rfl:    Coenzyme Q10 (CO Q 10 PO), Take 1 tablet by mouth daily., Disp: , Rfl:    EPIPEN 2-PAK 0.3 MG/0.3ML SOAJ injection, Inject 0.3 mg into the muscle as needed for anaphylaxis [employer] certified [employer])., Disp: , Rfl:    estradiol  (VIVELLE -DOT) 0.025 MG/24HR, Place 1 patch onto the skin 2 (two) times a week., Disp: 24 patch, Rfl: 3   fexofenadine (ALLEGRA) 180 MG tablet, Take 180 mg by mouth daily as needed for allergies., Disp: , Rfl:    Grape Seed Extract 100 MG CAPS, Take 1 capsule by mouth daily., Disp: , Rfl:    hydrochlorothiazide  (MICROZIDE ) 12.5 MG capsule, Take 12.5 mg by mouth daily., Disp: , Rfl:    losartan  (COZAAR ) 25 MG tablet, Take 3 tablets (75 mg total) by mouth daily., Disp: 90 tablet, Rfl: 3   MAGNESIUM GLYCINATE PO, Take 1 tablet by mouth daily., Disp: , Rfl:    Multiple Vitamin (MULTIVITAMIN) capsule, Take 1 capsule by mouth daily., Disp: , Rfl:    Omega-3 1000 MG CAPS, Take 1 capsule by mouth daily at 6 (six) AM., Disp: , Rfl:    Omega-3 Fatty Acids  (OMEGA-3 FISH OIL PO), Take 1,600 mg by mouth daily., Disp: , Rfl:    omeprazole (PRILOSEC) 20 MG capsule, Take 20 mg by mouth every other day., Disp: , Rfl:    progesterone  (PROMETRIUM ) 100 MG capsule, Take 100 mg by mouth daily., Disp: , Rfl:    pyridoxine (B-6) 100 MG tablet, Take 100 mg by mouth daily., Disp: , Rfl:    tretinoin (RETIN-A) 0.025 % cream, Apply 1 application topically at bedtime. , Disp: , Rfl:    vitamin k 100 MCG tablet, Take 100 mcg by mouth daily. Take after taking vitamin D3., Disp: , Rfl:    Zinc 50 MG TABS, Take 1 tablet by mouth daily., Disp: , Rfl:    nitroGLYCERIN  (NITROSTAT ) 0.4 MG SL tablet, Place 1 tablet (0.4 mg total) under the tongue every 5 (five) minutes as needed for chest pain. (Patient not taking: Reported on 03/12/2024), Disp: 30 tablet, Rfl: 0  Past Medical History: Past Medical History:  Diagnosis Date   Anemia    secondary to taking omeprazole-on iron   Cancer (HCC) 08/2020   lung   Diverticulosis 09/2002   Dysrhythmia    a-fib after surgery 09/04/20. treated with Metoprolol  for 6 week   GERD (gastroesophageal reflux disease)    Hyperlipidemia    Hypertension    Lung nodule    Osteopenia    Post traumatic  stress disorder    STD (sexually transmitted disease)    HSV    Tobacco Use: Social History   Tobacco Use  Smoking Status Former   Current packs/day: 0.00   Types: Cigarettes   Quit date: 02/21/1963   Years since quitting: 61.0  Smokeless Tobacco Never  Tobacco Comments   in college    Labs: Review Flowsheet        No data to display          Capillary Blood Glucose: No results found for: GLUCAP   Pulmonary Assessment Scores:  Pulmonary Assessment Scores     Row Name 03/12/24 1513         ADL UCSD   ADL Phase Entry     SOB Score total 2       CAT Score   CAT Score 2       mMRC Score   mMRC Score 1       UCSD: Self-administered rating of dyspnea associated with activities of daily living  (ADLs) 6-point scale (0 = not at all to 5 = maximal or unable to do because of breathlessness)  Scoring Scores range from 0 to 120.  Minimally important difference is 5 units  CAT: CAT can identify the health impairment of COPD patients and is better correlated with disease progression.  CAT has a scoring range of zero to 40. The CAT score is classified into four groups of low (less than 10), medium (10 - 20), high (21-30) and very high (31-40) based on the impact level of disease on health status. A CAT score over 10 suggests significant symptoms.  A worsening CAT score could be explained by an exacerbation, poor medication adherence, poor inhaler technique, or progression of COPD or comorbid conditions.  CAT MCID is 2 points  mMRC: mMRC (Modified Medical Research Council) Dyspnea Scale is used to assess the degree of baseline functional disability in patients of respiratory disease due to dyspnea. No minimal important difference is established. A decrease in score of 1 point or greater is considered a positive change.   Pulmonary Function Assessment:  Pulmonary Function Assessment - 03/12/24 1422       Breath   Bilateral Breath Sounds Clear    Shortness of Breath No          Exercise Target Goals: Exercise Program Goal: Individual exercise prescription set using results from initial 6 min walk test and THRR while considering  patient's activity barriers and safety.   Exercise Prescription Goal: Initial exercise prescription builds to 30-45 minutes a day of aerobic activity, 2-3 days per week.  Home exercise guidelines will be given to patient during program as part of exercise prescription that the participant will acknowledge.  Activity Barriers & Risk Stratification:  Activity Barriers & Cardiac Risk Stratification - 03/12/24 1414       Activity Barriers & Cardiac Risk Stratification   Activity Barriers Deconditioning;Muscular Weakness;Shortness of Breath;Left Knee  Replacement    Cardiac Risk Stratification Moderate          6 Minute Walk:  6 Minute Walk     Row Name 03/12/24 1518         6 Minute Walk   Phase Initial     Distance 1096 feet     Walk Time 6 minutes     # of Rest Breaks 0     METS 2.08     RPE 11     Perceived Dyspnea  0  VO2 Peak 7.79     Symptoms No     Resting HR 71 bpm     Resting BP 148/66     Resting Oxygen Saturation  99 %     Exercise Oxygen Saturation  during 6 min walk 96 %     Max Ex. HR 111 bpm     Max Ex. BP 190/60     2 Minute Post BP 160/60       Interval HR   1 Minute HR 90     2 Minute HR 105     3 Minute HR 111     4 Minute HR 107     5 Minute HR 109     6 Minute HR 110     2 Minute Post HR 72     Interval Heart Rate? Yes       Interval Oxygen   Interval Oxygen? Yes     Baseline Oxygen Saturation % 99 %     1 Minute Oxygen Saturation % 97 %     1 Minute Liters of Oxygen 0 L     2 Minute Oxygen Saturation % 96 %     2 Minute Liters of Oxygen 0 L     3 Minute Oxygen Saturation % 97 %     3 Minute Liters of Oxygen 0 L     4 Minute Oxygen Saturation % 96 %     4 Minute Liters of Oxygen 0 L     5 Minute Oxygen Saturation % 97 %     5 Minute Liters of Oxygen 0 L     6 Minute Oxygen Saturation % 96 %     6 Minute Liters of Oxygen 0 L     2 Minute Post Oxygen Saturation % 98 %     2 Minute Post Liters of Oxygen 0 L        Oxygen Initial Assessment:  Oxygen Initial Assessment - 03/12/24 1420       Home Oxygen   Home Oxygen Device None    Sleep Oxygen Prescription None    Home Exercise Oxygen Prescription None    Home Resting Oxygen Prescription None      Initial 6 min Walk   Oxygen Used None      Program Oxygen Prescription   Program Oxygen Prescription None      Intervention   Short Term Goals To learn and understand importance of monitoring SPO2 with pulse oximeter and demonstrate accurate use of the pulse oximeter.;To learn and understand importance of maintaining  oxygen saturations>88%;To learn and demonstrate proper pursed lip breathing techniques or other breathing techniques. ;To learn and demonstrate proper use of respiratory medications    Long  Term Goals Maintenance of O2 saturations>88%;Compliance with respiratory medication;Verbalizes importance of monitoring SPO2 with pulse oximeter and return demonstration;Exhibits proper breathing techniques, such as pursed lip breathing or other method taught during program session;Demonstrates proper use of MDI's          Oxygen Re-Evaluation:   Oxygen Discharge (Final Oxygen Re-Evaluation):   Initial Exercise Prescription:  Initial Exercise Prescription - 03/12/24 1500       Date of Initial Exercise RX and Referring Provider   Date 03/12/24    Referring Provider Dr. Alaine    Expected Discharge Date 06/07/24      Recumbant Bike   Level 1    RPM 50    Watts 40    Minutes 15  METs 1.9      NuStep   Level 1    SPM 64    Minutes 15    METs 1.9      Prescription Details   Frequency (times per week) 2    Duration Progress to 30 minutes of continuous aerobic without signs/symptoms of physical distress      Intensity   THRR 40-80% of Max Heartrate 54-107    Ratings of Perceived Exertion 11-13    Perceived Dyspnea 0-4      Progression   Progression Continue to progress workloads to maintain intensity without signs/symptoms of physical distress.      Resistance Training   Training Prescription Yes    Weight red bands    Reps 10-15          Perform Capillary Blood Glucose checks as needed.  Exercise Prescription Changes:   Exercise Comments:   Exercise Goals and Review:   Exercise Goals     Row Name 03/12/24 1414             Exercise Goals   Increase Physical Activity Yes       Intervention Provide advice, education, support and counseling about physical activity/exercise needs.;Develop an individualized exercise prescription for aerobic and resistive training  based on initial evaluation findings, risk stratification, comorbidities and participant's personal goals.       Expected Outcomes Short Term: Attend rehab on a regular basis to increase amount of physical activity.;Long Term: Add in home exercise to make exercise part of routine and to increase amount of physical activity.;Long Term: Exercising regularly at least 3-5 days a week.       Increase Strength and Stamina Yes       Intervention Provide advice, education, support and counseling about physical activity/exercise needs.;Develop an individualized exercise prescription for aerobic and resistive training based on initial evaluation findings, risk stratification, comorbidities and participant's personal goals.       Expected Outcomes Short Term: Increase workloads from initial exercise prescription for resistance, speed, and METs.;Short Term: Perform resistance training exercises routinely during rehab and add in resistance training at home;Long Term: Improve cardiorespiratory fitness, muscular endurance and strength as measured by increased METs and functional capacity ( )       Able to understand and use rate of perceived exertion (RPE) scale Yes       Intervention Provide education and explanation on how to use RPE scale       Expected Outcomes Short Term: Able to use RPE daily in rehab to express subjective intensity level;Long Term:  Able to use RPE to guide intensity level when exercising independently       Able to understand and use Dyspnea scale Yes       Intervention Provide education and explanation on how to use Dyspnea scale       Expected Outcomes Short Term: Able to use Dyspnea scale daily in rehab to express subjective sense of shortness of breath during exertion;Long Term: Able to use Dyspnea scale to guide intensity level when exercising independently       Knowledge and understanding of Target Heart Rate Range (THRR) Yes       Intervention Provide education and explanation of  THRR including how the numbers were predicted and where they are located for reference       Expected Outcomes Short Term: Able to state/look up THRR;Long Term: Able to use THRR to govern intensity when exercising independently;Short Term: Able to use daily as guideline for  intensity in rehab       Understanding of Exercise Prescription Yes       Intervention Provide education, explanation, and written materials on patient's individual exercise prescription       Expected Outcomes Short Term: Able to explain program exercise prescription;Long Term: Able to explain home exercise prescription to exercise independently          Exercise Goals Re-Evaluation :   Discharge Exercise Prescription (Final Exercise Prescription Changes):   Nutrition:  Target Goals: Understanding of nutrition guidelines, daily intake of sodium 1500mg , cholesterol 200mg , calories 30% from fat and 7% or less from saturated fats, daily to have 5 or more servings of fruits and vegetables.  Biometrics:  Pre Biometrics - 03/12/24 1513       Pre Biometrics   Grip Strength 20 kg           Nutrition Therapy Plan and Nutrition Goals:   Nutrition Assessments:  MEDIFICTS Score Key: >=70 Need to make dietary changes  40-70 Heart Healthy Diet <= 40 Therapeutic Level Cholesterol Diet   Picture Your Plate Scores: <59 Unhealthy dietary pattern with much room for improvement. 41-50 Dietary pattern unlikely to meet recommendations for good health and room for improvement. 51-60 More healthful dietary pattern, with some room for improvement.  >60 Healthy dietary pattern, although there may be some specific behaviors that could be improved.    Nutrition Goals Re-Evaluation:   Nutrition Goals Discharge (Final Nutrition Goals Re-Evaluation):   Psychosocial: Target Goals: Acknowledge presence or absence of significant depression and/or stress, maximize coping skills, provide positive support system. Participant  is able to verbalize types and ability to use techniques and skills needed for reducing stress and depression.  Initial Review & Psychosocial Screening:  Initial Psych Review & Screening - 03/12/24 1405       Initial Review   Current issues with None Identified      Family Dynamics   Good Support System? Yes      Barriers   Psychosocial barriers to participate in program There are no identifiable barriers or psychosocial needs.      Screening Interventions   Interventions Encouraged to exercise          Quality of Life Scores:  Scores of 19 and below usually indicate a poorer quality of life in these areas.  A difference of  2-3 points is a clinically meaningful difference.  A difference of 2-3 points in the total score of the Quality of Life Index has been associated with significant improvement in overall quality of life, self-image, physical symptoms, and general health in studies assessing change in quality of life.  PHQ-9: Review Flowsheet  More data may exist      03/12/2024 07/06/2023 04/22/2023 06/21/2022 07/13/2021  Depression screen PHQ 2/9  Decreased Interest 0 0 0 0 0  Down, Depressed, Hopeless 0 0 0 0 0  PHQ - 2 Score 0 0 0 0 0  Altered sleeping 0 - - - -  Tired, decreased energy 0 - - - -  Change in appetite 0 - - - -  Feeling bad or failure about yourself  0 - - - -  Trouble concentrating 0 - - - -  Moving slowly or fidgety/restless 0 - - - -  Suicidal thoughts 0 - - - -  PHQ-9 Score 0 - - - -  Difficult doing work/chores Not difficult at all - - - -   Interpretation of Total Score  Total Score Depression Severity:  1-4 = Minimal depression, 5-9 = Mild depression, 10-14 = Moderate depression, 15-19 = Moderately severe depression, 20-27 = Severe depression   Psychosocial Evaluation and Intervention:  Psychosocial Evaluation - 03/12/24 1512       Psychosocial Evaluation & Interventions   Interventions Encouraged to exercise with the program and follow  exercise prescription    Comments Arvetta denies any psychosocial barriers or concerns at this time.    Expected Outcomes For Kailey to participate in PR free of any psychosocial barriers or concerns    Continue Psychosocial Services  No Follow up required          Psychosocial Re-Evaluation:   Psychosocial Discharge (Final Psychosocial Re-Evaluation):   Education: Education Goals: Education classes will be provided on a weekly basis, covering required topics. Participant will state understanding/return demonstration of topics presented.  Learning Barriers/Preferences:  Learning Barriers/Preferences - 03/12/24 1406       Learning Barriers/Preferences   Learning Barriers Hearing   hearing loss   Learning Preferences None          Education Topics: Know Your Numbers Group instruction that is supported by a PowerPoint presentation. Instructor discusses importance of knowing and understanding resting, exercise, and post-exercise oxygen saturation, heart rate, and blood pressure. Oxygen saturation, heart rate, blood pressure, rating of perceived exertion, and dyspnea are reviewed along with a normal range for these values.    Exercise for the Pulmonary Patient Group instruction that is supported by a PowerPoint presentation. Instructor discusses benefits of exercise, core components of exercise, frequency, duration, and intensity of an exercise routine, importance of utilizing pulse oximetry during exercise, safety while exercising, and options of places to exercise outside of rehab.    MET Level  Group instruction provided by PowerPoint, verbal discussion, and written material to support subject matter. Instructor reviews what METs are and how to increase METs.    Pulmonary Medications Verbally interactive group education provided by instructor with focus on inhaled medications and proper administration.   Anatomy and Physiology of the Respiratory System Group instruction  provided by PowerPoint, verbal discussion, and written material to support subject matter. Instructor reviews respiratory cycle and anatomical components of the respiratory system and their functions. Instructor also reviews differences in obstructive and restrictive respiratory diseases with examples of each.    Oxygen Safety Group instruction provided by PowerPoint, verbal discussion, and written material to support subject matter. There is an overview of "What is Oxygen" and "Why do we need it".  Instructor also reviews how to create a safe environment for oxygen use, the importance of using oxygen as prescribed, and the risks of noncompliance. There is a brief discussion on traveling with oxygen and resources the patient may utilize.   Oxygen Use Group instruction provided by PowerPoint, verbal discussion, and written material to discuss how supplemental oxygen is prescribed and different types of oxygen supply systems. Resources for more information are provided.    Breathing Techniques Group instruction that is supported by demonstration and informational handouts. Instructor discusses the benefits of pursed lip and diaphragmatic breathing and detailed demonstration on how to perform both.     Risk Factor Reduction Group instruction that is supported by a PowerPoint presentation. Instructor discusses the definition of a risk factor, different risk factors for pulmonary disease, and how the heart and lungs work together.   Pulmonary Diseases Group instruction provided by PowerPoint, verbal discussion, and written material to support subject matter. Instructor gives an overview of the different type of pulmonary diseases. There  is also a discussion on risk factors and symptoms as well as ways to manage the diseases.   Stress and Energy Conservation Group instruction provided by PowerPoint, verbal discussion, and written material to support subject matter. Instructor gives an overview of  stress and the impact it can have on the body. Instructor also reviews ways to reduce stress. There is also a discussion on energy conservation and ways to conserve energy throughout the day.   Warning Signs and Symptoms Group instruction provided by PowerPoint, verbal discussion, and written material to support subject matter. Instructor reviews warning signs and symptoms of stroke, heart attack, cold and flu. Instructor also reviews ways to prevent the spread of infection.   Other Education Group or individual verbal, written, or video instructions that support the educational goals of the pulmonary rehab program.    Knowledge Questionnaire Score:  Knowledge Questionnaire Score - 03/12/24 1513       Knowledge Questionnaire Score   Pre Score 17/18          Core Components/Risk Factors/Patient Goals at Admission:  Personal Goals and Risk Factors at Admission - 03/12/24 1407       Core Components/Risk Factors/Patient Goals on Admission    Weight Management Weight Loss;Yes    Intervention Weight Management: Develop a combined nutrition and exercise program designed to reach desired caloric intake, while maintaining appropriate intake of nutrient and fiber, sodium and fats, and appropriate energy expenditure required for the weight goal.;Weight Management: Provide education and appropriate resources to help participant work on and attain dietary goals.;Weight Management/Obesity: Establish reasonable short term and long term weight goals.;Obesity: Provide education and appropriate resources to help participant work on and attain dietary goals.    Admit Weight 143 lb 15.4 oz (65.3 kg)    Expected Outcomes Short Term: Continue to assess and modify interventions until short term weight is achieved;Long Term: Adherence to nutrition and physical activity/exercise program aimed toward attainment of established weight goal;Weight Loss: Understanding of general recommendations for a balanced  deficit meal plan, which promotes 1-2 lb weight loss per week and includes a negative energy balance of [phone] kcal/d;Understanding recommendations for meals to include 15-35% energy as protein, 25-35% energy from fat, 35-60% energy from carbohydrates, less than 200mg  of dietary cholesterol, 20-35 gm of total fiber daily;Understanding of distribution of calorie intake throughout the day with the consumption of 4-5 meals/snacks    Improve shortness of breath with ADL's Yes    Intervention Provide education, individualized exercise plan and daily activity instruction to help decrease symptoms of SOB with activities of daily living.    Expected Outcomes Short Term: Improve cardiorespiratory fitness to achieve a reduction of symptoms when performing ADLs;Long Term: Be able to perform more ADLs without symptoms or delay the onset of symptoms          Core Components/Risk Factors/Patient Goals Review:    Core Components/Risk Factors/Patient Goals at Discharge (Final Review):    ITP Comments:   Comments: Dr. Slater Staff is Medical Director for Pulmonary Rehab at [facility].

## 2024-03-12 NOTE — Progress Notes (Signed)
 Pulmonary Rehab Orientation Physical Assessment Note  Patient Details  Name: Marissa Jacobs MRN: [id number] Date of Birth: 1935-10-16 Referring Provider:  Dr. Alaine                                                            Well appearing, A&Ox4, NAD Eyes/Ears: states she is HOH, not wearing her hearing aids, in process of getting new ones, states she has 20/20 vision, had cataracts 8-9 yrs ago Lungs: Clear with no wheezes, rales, rhonchi, denies chronic cough,+ dyspnea on exertion Heart: Regular rate rhythm, no murmurs, no rubs, no clicks Gastrointestinal: abdomin soft, + bowel sounds in all 4 quads, denies recent weight gain or loss, endorses normal Bms, + decreased appetite, no change in weight Genitourinary: WNL, pt denies s/s Extremities: +2 pulses, grip strength equal, strong, no edema, no cyanosis, no clubbing Integumentary: pt denies any rashes, open or non healing wounds Psy/Soc: Pt denies need for additional resources or referrals, 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

## 2024-03-12 NOTE — Progress Notes (Signed)
 Marissa Jacobs Marissa Jacobs 88 y.o. female Pulmonary Rehab Orientation Note This patient who was referred to Pulmonary Rehab by Dr. Alaine with the diagnosis of centrilobular emphysema/lung cancer arrived today in Cardiac and Pulmonary Rehab. She  arrived ambulatory with normal gait. She  does not carry portable oxygen. Per patient, [place] uses oxygen never. Color good, skin warm and dry. Patient is oriented to time and place. Patient's medical history, psychosocial health, and medications reviewed. Psychosocial assessment reveals patient lives with significant other. 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 Patient hobbies include spending time with others and traveling. Patient reports her stress level is low. Areas of stress/anxiety include health. Patient does not exhibit signs of depression.  PHQ2/9 score 0/0. Gianny shows good  coping skills with positive outlook on life. Offered emotional support and reassurance. Will continue to monitor. Physical assessment performed by Nurse pick: Ronal Levin RN. Please see their orientation physical assessment note. Jeane reports she does take medications as prescribed. Patient states she follows a regular  diet. The patient reports she would like to lose weight. Patient's weight will be monitored closely. Demonstration and practice of PLB using pulse oximeter. Ajooni able to return demonstration satisfactorily. Safety and hand hygiene in the exercise area reviewed with patient. Nadya voices understanding of the information reviewed. Department expectations discussed with patient and achievable goals were set. The patient shows enthusiasm about attending the program and we look forward to working with Marissa Jacobs. Bevin completed a 6 min walk test today and is scheduled to begin exercise on 03/22/24@1 :00.   1300-1500 Augustin KATHEE Sharps, BSRT

## 2024-03-20 ENCOUNTER — Encounter (HOSPITAL_COMMUNITY)

## 2024-03-20 DIAGNOSIS — R0602 Shortness of breath: Secondary | ICD-10-CM | POA: Diagnosis not present

## 2024-03-21 NOTE — Progress Notes (Signed)
 Pulmonary Individual Treatment Plan  Patient Details  Name: Marissa Jacobs MRN: [id number] Date of Birth: [birth year] Referring Provider:   Conrad Ports Pulmonary Rehab Walk Test from 03/12/2024 in [facility] for [facility], V[facility], [facility]  Referring Provider Dr. Alaine    Initial Encounter Date:  Flowsheet Row Pulmonary Rehab Walk Test from 03/12/2024 in [facility] for Heart, Vascular, & Lung Health  Date 03/12/24    Visit Diagnosis: Centrilobular emphysema (HCC)  Malignant neoplasm of unspecified part of left bronchus or lung (HCC)  Patient's Home Medications on Admission:   Current Outpatient Medications:    Ascorbic Acid (VITAMIN C) 500 MG CAPS, Take 1 Capful by mouth daily., Disp: , Rfl:    Cholecalciferol 125 MCG (5000 UT) TABS, Take 1 tablet by mouth daily., Disp: , Rfl:    Cobalamin Combinations (B12 FOLATE) 800-800 MCG CAPS, 1 tablet Orally Once a day, Disp: , Rfl:    Coenzyme Q10 (CO Q 10 PO), Take 1 tablet by mouth daily., Disp: , Rfl:    EPIPEN 2-PAK 0.3 MG/0.3ML SOAJ injection, Inject 0.3 mg into the muscle as needed for anaphylaxis [employer] certified [employer])., Disp: , Rfl:    estradiol  (VIVELLE -DOT) 0.025 MG/24HR, Place 1 patch onto the skin 2 (two) times a week., Disp: 24 patch, Rfl: 3   fexofenadine (ALLEGRA) 180 MG tablet, Take 180 mg by mouth daily as needed for allergies., Disp: , Rfl:    Grape Seed Extract 100 MG CAPS, Take 1 capsule by mouth daily., Disp: , Rfl:    hydrochlorothiazide  (MICROZIDE ) 12.5 MG capsule, Take 12.5 mg by mouth daily., Disp: , Rfl:    losartan  (COZAAR ) 25 MG tablet, Take 3 tablets (75 mg total) by mouth daily., Disp: 90 tablet, Rfl: 3   MAGNESIUM GLYCINATE PO, Take 1 tablet by mouth daily., Disp: , Rfl:    Multiple Vitamin (MULTIVITAMIN) capsule, Take 1 capsule by mouth daily., Disp: , Rfl:    Omega-3 1000 MG CAPS, Take 1 capsule by mouth daily at 6 (six) AM., Disp: , Rfl:    Omega-3 Fatty Acids  (OMEGA-3 FISH OIL PO), Take 1,600 mg by mouth daily., Disp: , Rfl:    omeprazole (PRILOSEC) 20 MG capsule, Take 20 mg by mouth every other day., Disp: , Rfl:    progesterone  (PROMETRIUM ) 100 MG capsule, Take 100 mg by mouth daily., Disp: , Rfl:    pyridoxine (B-6) 100 MG tablet, Take 100 mg by mouth daily., Disp: , Rfl:    tretinoin (RETIN-A) 0.025 % cream, Apply 1 application topically at bedtime. , Disp: , Rfl:    vitamin k 100 MCG tablet, Take 100 mcg by mouth daily. Take after taking vitamin D3., Disp: , Rfl:    Zinc 50 MG TABS, Take 1 tablet by mouth daily., Disp: , Rfl:    nitroGLYCERIN  (NITROSTAT ) 0.4 MG SL tablet, Place 1 tablet (0.4 mg total) under the tongue every 5 (five) minutes as needed for chest pain. (Patient not taking: Reported on 03/12/2024), Disp: 30 tablet, Rfl: 0  Past Medical History: Past Medical History:  Diagnosis Date   Anemia    secondary to taking omeprazole-on iron   Cancer (HCC) 08/2020   lung   Diverticulosis 09/2002   Dysrhythmia    a-fib after surgery 09/04/20. treated with Metoprolol  for 6 week   GERD (gastroesophageal reflux disease)    Hyperlipidemia    Hypertension    Lung nodule    Osteopenia    Post traumatic  stress disorder    STD (sexually transmitted disease)    HSV    Tobacco Use: Social History   Tobacco Use  Smoking Status Former   Current packs/day: 0.00   Types: Cigarettes   Quit date: 02/21/1963   Years since quitting: 61.1  Smokeless Tobacco Never  Tobacco Comments   in college    Labs: Review Flowsheet        No data to display          Capillary Blood Glucose: No results found for: GLUCAP   Pulmonary Assessment Scores:  Pulmonary Assessment Scores     Row Name 03/12/24 1513         ADL UCSD   ADL Phase Entry     SOB Score total 2       CAT Score   CAT Score 2       mMRC Score   mMRC Score 1       UCSD: Self-administered rating of dyspnea associated with activities of daily living  (ADLs) 6-point scale (0 = not at all to 5 = maximal or unable to do because of breathlessness)  Scoring Scores range from 0 to 120.  Minimally important difference is 5 units  CAT: CAT can identify the health impairment of COPD patients and is better correlated with disease progression.  CAT has a scoring range of zero to 40. The CAT score is classified into four groups of low (less than 10), medium (10 - 20), high (21-30) and very high (31-40) based on the impact level of disease on health status. A CAT score over 10 suggests significant symptoms.  A worsening CAT score could be explained by an exacerbation, poor medication adherence, poor inhaler technique, or progression of COPD or comorbid conditions.  CAT MCID is 2 points  mMRC: mMRC (Modified Medical Research Council) Dyspnea Scale is used to assess the degree of baseline functional disability in patients of respiratory disease due to dyspnea. No minimal important difference is established. A decrease in score of 1 point or greater is considered a positive change.   Pulmonary Function Assessment:  Pulmonary Function Assessment - 03/12/24 1422       Breath   Bilateral Breath Sounds Clear    Shortness of Breath No          Exercise Target Goals: Exercise Program Goal: Individual exercise prescription set using results from initial 6 min walk test and THRR while considering  patient's activity barriers and safety.   Exercise Prescription Goal: Initial exercise prescription builds to 30-45 minutes a day of aerobic activity, 2-3 days per week.  Home exercise guidelines will be given to patient during program as part of exercise prescription that the participant will acknowledge.  Activity Barriers & Risk Stratification:  Activity Barriers & Cardiac Risk Stratification - 03/12/24 1414       Activity Barriers & Cardiac Risk Stratification   Activity Barriers Deconditioning;Muscular Weakness;Shortness of Breath;Left Knee  Replacement    Cardiac Risk Stratification Moderate          6 Minute Walk:  6 Minute Walk     Row Name 03/12/24 1518         6 Minute Walk   Phase Initial     Distance 1096 feet     Walk Time 6 minutes     # of Rest Breaks 0     METS 2.08     RPE 11     Perceived Dyspnea  0  VO2 Peak 7.79     Symptoms No     Resting HR 71 bpm     Resting BP 148/66     Resting Oxygen Saturation  99 %     Exercise Oxygen Saturation  during 6 min walk 96 %     Max Ex. HR 111 bpm     Max Ex. BP 190/60     2 Minute Post BP 160/60       Interval HR   1 Minute HR 90     2 Minute HR 105     3 Minute HR 111     4 Minute HR 107     5 Minute HR 109     6 Minute HR 110     2 Minute Post HR 72     Interval Heart Rate? Yes       Interval Oxygen   Interval Oxygen? Yes     Baseline Oxygen Saturation % 99 %     1 Minute Oxygen Saturation % 97 %     1 Minute Liters of Oxygen 0 L     2 Minute Oxygen Saturation % 96 %     2 Minute Liters of Oxygen 0 L     3 Minute Oxygen Saturation % 97 %     3 Minute Liters of Oxygen 0 L     4 Minute Oxygen Saturation % 96 %     4 Minute Liters of Oxygen 0 L     5 Minute Oxygen Saturation % 97 %     5 Minute Liters of Oxygen 0 L     6 Minute Oxygen Saturation % 96 %     6 Minute Liters of Oxygen 0 L     2 Minute Post Oxygen Saturation % 98 %     2 Minute Post Liters of Oxygen 0 L        Oxygen Initial Assessment:  Oxygen Initial Assessment - 03/12/24 1420       Home Oxygen   Home Oxygen Device None    Sleep Oxygen Prescription None    Home Exercise Oxygen Prescription None    Home Resting Oxygen Prescription None      Initial 6 min Walk   Oxygen Used None      Program Oxygen Prescription   Program Oxygen Prescription None      Intervention   Short Term Goals To learn and understand importance of monitoring SPO2 with pulse oximeter and demonstrate accurate use of the pulse oximeter.;To learn and understand importance of maintaining  oxygen saturations>88%;To learn and demonstrate proper pursed lip breathing techniques or other breathing techniques. ;To learn and demonstrate proper use of respiratory medications    Long  Term Goals Maintenance of O2 saturations>88%;Compliance with respiratory medication;Verbalizes importance of monitoring SPO2 with pulse oximeter and return demonstration;Exhibits proper breathing techniques, such as pursed lip breathing or other method taught during program session;Demonstrates proper use of MDI's          Oxygen Re-Evaluation:  Oxygen Re-Evaluation     Row Name 03/20/24 0912             Program Oxygen Prescription   Program Oxygen Prescription None         Home Oxygen   Home Oxygen Device None       Sleep Oxygen Prescription None       Home Exercise Oxygen Prescription None       Home Resting Oxygen Prescription None  Goals/Expected Outcomes   Short Term Goals To learn and understand importance of monitoring SPO2 with pulse oximeter and demonstrate accurate use of the pulse oximeter.;To learn and understand importance of maintaining oxygen saturations>88%;To learn and demonstrate proper pursed lip breathing techniques or other breathing techniques. ;To learn and demonstrate proper use of respiratory medications       Long  Term Goals Maintenance of O2 saturations>88%;Compliance with respiratory medication;Verbalizes importance of monitoring SPO2 with pulse oximeter and return demonstration;Exhibits proper breathing techniques, such as pursed lip breathing or other method taught during program session;Demonstrates proper use of MDI's       Goals/Expected Outcomes Compliance and understanding of oxygen saturation monitoring and breathing techniques to decrease shortness of breath.          Oxygen Discharge (Final Oxygen Re-Evaluation):  Oxygen Re-Evaluation - 03/20/24 0912       Program Oxygen Prescription   Program Oxygen Prescription None      Home Oxygen   Home  Oxygen Device None    Sleep Oxygen Prescription None    Home Exercise Oxygen Prescription None    Home Resting Oxygen Prescription None      Goals/Expected Outcomes   Short Term Goals To learn and understand importance of monitoring SPO2 with pulse oximeter and demonstrate accurate use of the pulse oximeter.;To learn and understand importance of maintaining oxygen saturations>88%;To learn and demonstrate proper pursed lip breathing techniques or other breathing techniques. ;To learn and demonstrate proper use of respiratory medications    Long  Term Goals Maintenance of O2 saturations>88%;Compliance with respiratory medication;Verbalizes importance of monitoring SPO2 with pulse oximeter and return demonstration;Exhibits proper breathing techniques, such as pursed lip breathing or other method taught during program session;Demonstrates proper use of MDI's    Goals/Expected Outcomes Compliance and understanding of oxygen saturation monitoring and breathing techniques to decrease shortness of breath.          Initial Exercise Prescription:  Initial Exercise Prescription - 03/12/24 1500       Date of Initial Exercise RX and Referring Provider   Date 03/12/24    Referring Provider Dr. Alaine    Expected Discharge Date 06/07/24      Recumbant Bike   Level 1    RPM 50    Watts 40    Minutes 15    METs 1.9      NuStep   Level 1    SPM 64    Minutes 15    METs 1.9      Prescription Details   Frequency (times per week) 2    Duration Progress to 30 minutes of continuous aerobic without signs/symptoms of physical distress      Intensity   THRR 40-80% of Max Heartrate 54-107    Ratings of Perceived Exertion 11-13    Perceived Dyspnea 0-4      Progression   Progression Continue to progress workloads to maintain intensity without signs/symptoms of physical distress.      Resistance Training   Training Prescription Yes    Weight red bands    Reps 10-15          Perform  Capillary Blood Glucose checks as needed.  Exercise Prescription Changes:   Exercise Comments:   Exercise Goals and Review:   Exercise Goals     Row Name 03/12/24 1414             Exercise Goals   Increase Physical Activity Yes       Intervention Provide advice,  education, support and counseling about physical activity/exercise needs.;Develop an individualized exercise prescription for aerobic and resistive training based on initial evaluation findings, risk stratification, comorbidities and participant's personal goals.       Expected Outcomes Short Term: Attend rehab on a regular basis to increase amount of physical activity.;Long Term: Add in home exercise to make exercise part of routine and to increase amount of physical activity.;Long Term: Exercising regularly at least 3-5 days a week.       Increase Strength and Stamina Yes       Intervention Provide advice, education, support and counseling about physical activity/exercise needs.;Develop an individualized exercise prescription for aerobic and resistive training based on initial evaluation findings, risk stratification, comorbidities and participant's personal goals.       Expected Outcomes Short Term: Increase workloads from initial exercise prescription for resistance, speed, and METs.;Short Term: Perform resistance training exercises routinely during rehab and add in resistance training at home;Long Term: Improve cardiorespiratory fitness, muscular endurance and strength as measured by increased METs and functional capacity ( )       Able to understand and use rate of perceived exertion (RPE) scale Yes       Intervention Provide education and explanation on how to use RPE scale       Expected Outcomes Short Term: Able to use RPE daily in rehab to express subjective intensity level;Long Term:  Able to use RPE to guide intensity level when exercising independently       Able to understand and use Dyspnea scale Yes        Intervention Provide education and explanation on how to use Dyspnea scale       Expected Outcomes Short Term: Able to use Dyspnea scale daily in rehab to express subjective sense of shortness of breath during exertion;Long Term: Able to use Dyspnea scale to guide intensity level when exercising independently       Knowledge and understanding of Target Heart Rate Range (THRR) Yes       Intervention Provide education and explanation of THRR including how the numbers were predicted and where they are located for reference       Expected Outcomes Short Term: Able to state/look up THRR;Long Term: Able to use THRR to govern intensity when exercising independently;Short Term: Able to use daily as guideline for intensity in rehab       Understanding of Exercise Prescription Yes       Intervention Provide education, explanation, and written materials on patient's individual exercise prescription       Expected Outcomes Short Term: Able to explain program exercise prescription;Long Term: Able to explain home exercise prescription to exercise independently          Exercise Goals Re-Evaluation :  Exercise Goals Re-Evaluation     Row Name 03/20/24 0911             Exercise Goal Re-Evaluation   Exercise Goals Review Increase Physical Activity;Able to understand and use Dyspnea scale;Understanding of Exercise Prescription;Increase Strength and Stamina;Knowledge and understanding of Target Heart Rate Range (THRR);Able to understand and use rate of perceived exertion (RPE) scale       Comments Pt is scheduled to begin exercise 8/7. Will progress as tolerated.       Expected Outcomes Through exercise at rehab and home, the patient will decrease shortness of breath and feel confident in carrying out an exercise regimen at home.          Discharge Exercise Prescription (Final Exercise Prescription  Changes):   Nutrition:  Target Goals: Understanding of nutrition guidelines, daily intake of sodium  1500mg , cholesterol 200mg , calories 30% from fat and 7% or less from saturated fats, daily to have 5 or more servings of fruits and vegetables.  Biometrics:  Pre Biometrics - 03/12/24 1513       Pre Biometrics   Grip Strength 20 kg           Nutrition Therapy Plan and Nutrition Goals:   Nutrition Assessments:  MEDIFICTS Score Key: >=70 Need to make dietary changes  40-70 Heart Healthy Diet <= 40 Therapeutic Level Cholesterol Diet   Picture Your Plate Scores: <59 Unhealthy dietary pattern with much room for improvement. 41-50 Dietary pattern unlikely to meet recommendations for good health and room for improvement. 51-60 More healthful dietary pattern, with some room for improvement.  >60 Healthy dietary pattern, although there may be some specific behaviors that could be improved.    Nutrition Goals Re-Evaluation:   Nutrition Goals Discharge (Final Nutrition Goals Re-Evaluation):   Psychosocial: Target Goals: Acknowledge presence or absence of significant depression and/or stress, maximize coping skills, provide positive support system. Participant is able to verbalize types and ability to use techniques and skills needed for reducing stress and depression.  Initial Review & Psychosocial Screening:  Initial Psych Review & Screening - 03/12/24 1405       Initial Review   Current issues with None Identified      Family Dynamics   Good Support System? Yes      Barriers   Psychosocial barriers to participate in program There are no identifiable barriers or psychosocial needs.      Screening Interventions   Interventions Encouraged to exercise          Quality of Life Scores:  Scores of 19 and below usually indicate a poorer quality of life in these areas.  A difference of  2-3 points is a clinically meaningful difference.  A difference of 2-3 points in the total score of the Quality of Life Index has been associated with significant improvement in overall  quality of life, self-image, physical symptoms, and general health in studies assessing change in quality of life.  PHQ-9: Review Flowsheet  More data may exist      03/12/2024 07/06/2023 04/22/2023 06/21/2022 07/13/2021  Depression screen PHQ 2/9  Decreased Interest 0 0 0 0 0  Down, Depressed, Hopeless 0 0 0 0 0  PHQ - 2 Score 0 0 0 0 0  Altered sleeping 0 - - - -  Tired, decreased energy 0 - - - -  Change in appetite 0 - - - -  Feeling bad or failure about yourself  0 - - - -  Trouble concentrating 0 - - - -  Moving slowly or fidgety/restless 0 - - - -  Suicidal thoughts 0 - - - -  PHQ-9 Score 0 - - - -  Difficult doing work/chores Not difficult at all - - - -   Interpretation of Total Score  Total Score Depression Severity:  1-4 = Minimal depression, 5-9 = Mild depression, 10-14 = Moderate depression, 15-19 = Moderately severe depression, 20-27 = Severe depression   Psychosocial Evaluation and Intervention:  Psychosocial Evaluation - 03/12/24 1512       Psychosocial Evaluation & Interventions   Interventions Encouraged to exercise with the program and follow exercise prescription    Comments Kaislee denies any psychosocial barriers or concerns at this time.    Expected Outcomes For [facility]  to participate in PR free of any psychosocial barriers or concerns    Continue Psychosocial Services  No Follow up required          Psychosocial Re-Evaluation:  Psychosocial Re-Evaluation     Row Name 03/16/24 1544             Psychosocial Re-Evaluation   Current issues with None Identified       Comments Monthly psychosocial re-evaluation as follows: Kayleanna has not started the program yet. She is scheduled to start next week.       Expected Outcomes For Jesiah to participate in PR free of any psychosocial barriers or concerns       Interventions Encouraged to attend Pulmonary Rehabilitation for the exercise       Continue Psychosocial Services  No Follow up required           Psychosocial Discharge (Final Psychosocial Re-Evaluation):  Psychosocial Re-Evaluation - 03/16/24 1544       Psychosocial Re-Evaluation   Current issues with None Identified    Comments Monthly psychosocial re-evaluation as follows: Kaytee has not started the program yet. She is scheduled to start next week.    Expected Outcomes For [place] to participate in PR free of any psychosocial barriers or concerns    Interventions Encouraged to attend Pulmonary Rehabilitation for the exercise    Continue Psychosocial Services  No Follow up required          Education: Education Goals: Education classes will be provided on a weekly basis, covering required topics. Participant will state understanding/return demonstration of topics presented.  Learning Barriers/Preferences:  Learning Barriers/Preferences - 03/12/24 1406       Learning Barriers/Preferences   Learning Barriers Hearing   hearing loss   Learning Preferences None          Education Topics: Know Your Numbers Group instruction that is supported by a PowerPoint presentation. Instructor discusses importance of knowing and understanding resting, exercise, and post-exercise oxygen saturation, heart rate, and blood pressure. Oxygen saturation, heart rate, blood pressure, rating of perceived exertion, and dyspnea are reviewed along with a normal range for these values.    Exercise for the Pulmonary Patient Group instruction that is supported by a PowerPoint presentation. Instructor discusses benefits of exercise, core components of exercise, frequency, duration, and intensity of an exercise routine, importance of utilizing pulse oximetry during exercise, safety while exercising, and options of places to exercise outside of rehab.    MET Level  Group instruction provided by PowerPoint, verbal discussion, and written material to support subject matter. Instructor reviews what METs are and how to increase METs.    Pulmonary  Medications Verbally interactive group education provided by instructor with focus on inhaled medications and proper administration.   Anatomy and Physiology of the Respiratory System Group instruction provided by PowerPoint, verbal discussion, and written material to support subject matter. Instructor reviews respiratory cycle and anatomical components of the respiratory system and their functions. Instructor also reviews differences in obstructive and restrictive respiratory diseases with examples of each.    Oxygen Safety Group instruction provided by PowerPoint, verbal discussion, and written material to support subject matter. There is an overview of "What is Oxygen" and "Why do we need it".  Instructor also reviews how to create a safe environment for oxygen use, the importance of using oxygen as prescribed, and the risks of noncompliance. There is a brief discussion on traveling with oxygen and resources the patient may utilize.   Oxygen Use Group  instruction provided by PowerPoint, verbal discussion, and written material to discuss how supplemental oxygen is prescribed and different types of oxygen supply systems. Resources for more information are provided.    Breathing Techniques Group instruction that is supported by demonstration and informational handouts. Instructor discusses the benefits of pursed lip and diaphragmatic breathing and detailed demonstration on how to perform both.     Risk Factor Reduction Group instruction that is supported by a PowerPoint presentation. Instructor discusses the definition of a risk factor, different risk factors for pulmonary disease, and how the heart and lungs work together.   Pulmonary Diseases Group instruction provided by PowerPoint, verbal discussion, and written material to support subject matter. Instructor gives an overview of the different type of pulmonary diseases. There is also a discussion on risk factors and symptoms as well as  ways to manage the diseases.   Stress and Energy Conservation Group instruction provided by PowerPoint, verbal discussion, and written material to support subject matter. Instructor gives an overview of stress and the impact it can have on the body. Instructor also reviews ways to reduce stress. There is also a discussion on energy conservation and ways to conserve energy throughout the day.   Warning Signs and Symptoms Group instruction provided by PowerPoint, verbal discussion, and written material to support subject matter. Instructor reviews warning signs and symptoms of stroke, heart attack, cold and flu. Instructor also reviews ways to prevent the spread of infection.   Other Education Group or individual verbal, written, or video instructions that support the educational goals of the pulmonary rehab program.    Knowledge Questionnaire Score:  Knowledge Questionnaire Score - 03/12/24 1513       Knowledge Questionnaire Score   Pre Score 17/18          Core Components/Risk Factors/Patient Goals at Admission:  Personal Goals and Risk Factors at Admission - 03/12/24 1407       Core Components/Risk Factors/Patient Goals on Admission    Weight Management Weight Loss;Yes    Intervention Weight Management: Develop a combined nutrition and exercise program designed to reach desired caloric intake, while maintaining appropriate intake of nutrient and fiber, sodium and fats, and appropriate energy expenditure required for the weight goal.;Weight Management: Provide education and appropriate resources to help participant work on and attain dietary goals.;Weight Management/Obesity: Establish reasonable short term and long term weight goals.;Obesity: Provide education and appropriate resources to help participant work on and attain dietary goals.    Admit Weight 143 lb 15.4 oz (65.3 kg)    Expected Outcomes Short Term: Continue to assess and modify interventions until short term weight is  achieved;Long Term: Adherence to nutrition and physical activity/exercise program aimed toward attainment of established weight goal;Weight Loss: Understanding of general recommendations for a balanced deficit meal plan, which promotes 1-2 lb weight loss per week and includes a negative energy balance of [phone] kcal/d;Understanding recommendations for meals to include 15-35% energy as protein, 25-35% energy from fat, 35-60% energy from carbohydrates, less than 200mg  of dietary cholesterol, 20-35 gm of total fiber daily;Understanding of distribution of calorie intake throughout the day with the consumption of 4-5 meals/snacks    Improve shortness of breath with ADL's Yes    Intervention Provide education, individualized exercise plan and daily activity instruction to help decrease symptoms of SOB with activities of daily living.    Expected Outcomes Short Term: Improve cardiorespiratory fitness to achieve a reduction of symptoms when performing ADLs;Long Term: Be able to perform more ADLs without symptoms  or delay the onset of symptoms          Core Components/Risk Factors/Patient Goals Review:   Goals and Risk Factor Review     Row Name 03/16/24 1545             Core Components/Risk Factors/Patient Goals Review   Personal Goals Review Weight Management/Obesity;Improve shortness of breath with ADL's;Develop more efficient breathing techniques such as purse lipped breathing and diaphragmatic breathing and practicing self-pacing with activity.       Review Monthly Core Components/Risk Factors/Patient Goals Review as follows: Unable to assess. Bettis is scheduled to start the program next week.       Expected Outcomes Pt will show progress toward meeting expected goals and outcomes.          Core Components/Risk Factors/Patient Goals at Discharge (Final Review):   Goals and Risk Factor Review - 03/16/24 1545       Core Components/Risk Factors/Patient Goals Review   Personal Goals Review  Weight Management/Obesity;Improve shortness of breath with ADL's;Develop more efficient breathing techniques such as purse lipped breathing and diaphragmatic breathing and practicing self-pacing with activity.    Review Monthly Core Components/Risk Factors/Patient Goals Review as follows: Unable to assess. Bettis is scheduled to start the program next week.    Expected Outcomes Pt will show progress toward meeting expected goals and outcomes.          ITP Comments: Pt has not started the Pulmonary Rehab program yet. Recommend to start exercise, life style modification, education, and utilization of breathing techniques to increase stamina and strength, while also decreasing shortness of breath with exertion.     Comments: Dr. Slater Staff is Medical Director for Pulmonary Rehab at [facility].

## 2024-03-22 ENCOUNTER — Encounter (HOSPITAL_COMMUNITY)
Admission: RE | Admit: 2024-03-22 | Discharge: 2024-03-22 | Disposition: A | Discharge status: Home / Self Care | Source: Ambulatory Visit | Attending: Pulmonary Disease | Admitting: Pulmonary Disease

## 2024-03-22 ENCOUNTER — Telehealth: Payer: Self-pay | Admitting: *Deleted

## 2024-03-22 DIAGNOSIS — I10 Essential (primary) hypertension: Secondary | ICD-10-CM

## 2024-03-22 DIAGNOSIS — J432 Centrilobular emphysema: Secondary | ICD-10-CM | POA: Diagnosis not present

## 2024-03-22 DIAGNOSIS — C3492 Malignant neoplasm of unspecified part of left bronchus or lung: Secondary | ICD-10-CM | POA: Insufficient documentation

## 2024-03-22 MED ORDER — LOSARTAN POTASSIUM 100 MG PO TABS: 100.0000 mg | ORAL_TABLET | Freq: Every day | ORAL | 3 refills | Status: AC

## 2024-03-22 NOTE — Telephone Encounter (Signed)
-----   Message from Redell Shallow sent at 03/22/2024  5:13 PM EDT ----- Regarding: FW: Hypertension Increase losartan  to 50 mg daily; follow BP; bmet one week Redell Shallow ----- Message ----- From: Harvy Shuck, RN Sent: 03/22/2024   3:46 PM EDT To: Redell GORMAN Shallow, MD Subject: Hypertension                                   Pt is in [facility]. BP has been elevated. She has been keeping a log since her orientation with us . We instructed her to take her BP 1-2 hours after she took her BP meds. Currently taking hydrochlorothiazide  12.5mg  and losartan  75mg . Should she increase to 100mg ? And if so, can you please call in new prescription for her. Thanks  7/28  Resting 148/66, HR 71          Exercise BP 190/60, HR 111 7/29  134/64 7/30  144/65 7/31  168/71 8/1    160/79 8/2    153/71 8/3    153/73 8/4    147/73 8/5    159/81 8/6    142/66

## 2024-03-22 NOTE — Telephone Encounter (Addendum)
 Spoke with pt, Aware of dr vertie recommendations. She is currently taking losartan  75 mg once daily. She will increase to 100 mg daily. Lab orders mailed to the pt

## 2024-03-22 NOTE — Progress Notes (Signed)
 Daily Session Note  Patient Details  Name: Marissa Jacobs MRN: [id number] Date of Birth: 1936-05-04 Referring Provider:   Conrad Ports Pulmonary Rehab Walk Test from 03/12/2024 in [facility], V[facility]  Referring Provider Dr. Alaine    Encounter Date: 03/22/2024  Check In:  Session Check In - 03/22/24 1436       Check-In   Supervising physician immediately available to respond to emergencies [facility] MD immediately available    Physician(s) Orren Fabry, PA    Location [facility]    Staff Present Ronal Levin, RN, BSN;Casey Claudene, Alfred Pereyra, MS, Exercise Physiologist;Randi Midge HECKLE, ACSM-CEP, Exercise Physiologist;Jerran Tappan Nicholaus, MS, ACSM-CEP, Exercise Physiologist    Virtual Visit No    Medication changes reported     No    Fall or balance concerns reported    No    Tobacco Cessation No Change    Warm-up and Cool-down Performed as group-led instruction    Resistance Training Performed Yes    VAD Patient? No    PAD/SET Patient? No      Pain Assessment   Currently in Pain? No/denies    Multiple Pain Sites No          Capillary Blood Glucose: No results found for this or any previous visit (from the past 24 hours).    Social History   Tobacco Use  Smoking Status Former   Current packs/day: 0.00   Types: Cigarettes   Quit date: 02/21/1963   Years since quitting: 61.1  Smokeless Tobacco Never  Tobacco Comments   in college    Goals Met:  Proper associated with RPD/PD & O2 Sat Exercise tolerated well No report of concerns or symptoms today Strength training completed today  Goals Unmet:  Not Applicable  Comments: Service time is from 1312 to 1438.    Dr. Slater Staff is Medical Director for Pulmonary Rehab at [facility].

## 2024-03-27 ENCOUNTER — Encounter (HOSPITAL_COMMUNITY)
Admission: RE | Admit: 2024-03-27 | Discharge: 2024-03-27 | Disposition: A | Discharge status: Home / Self Care | Source: Ambulatory Visit | Attending: Pulmonary Disease

## 2024-03-27 ENCOUNTER — Telehealth (HOSPITAL_COMMUNITY): Payer: Self-pay

## 2024-03-27 NOTE — Telephone Encounter (Signed)
 Patient c/o for 1:15 PR class, states her car is in the shop and has no other transportation at this time.

## 2024-03-28 DIAGNOSIS — M2041 Other hammer toe(s) (acquired), right foot: Secondary | ICD-10-CM | POA: Diagnosis not present

## 2024-03-28 DIAGNOSIS — M7741 Metatarsalgia, right foot: Secondary | ICD-10-CM | POA: Diagnosis not present

## 2024-03-28 DIAGNOSIS — Q828 Other specified congenital malformations of skin: Secondary | ICD-10-CM | POA: Diagnosis not present

## 2024-03-29 ENCOUNTER — Encounter (HOSPITAL_COMMUNITY)
Admission: RE | Admit: 2024-03-29 | Discharge: 2024-03-29 | Disposition: A | Discharge status: Home / Self Care | Source: Ambulatory Visit | Attending: Pulmonary Disease | Admitting: Pulmonary Disease

## 2024-03-29 DIAGNOSIS — J432 Centrilobular emphysema: Secondary | ICD-10-CM

## 2024-03-29 DIAGNOSIS — C3492 Malignant neoplasm of unspecified part of left bronchus or lung: Secondary | ICD-10-CM | POA: Diagnosis not present

## 2024-03-29 NOTE — Progress Notes (Signed)
 Daily Session Note  Patient Details  Name: ELENOR WILDES MRN: [id number] Date of Birth: Jul 12, 1936 Referring Provider:   Conrad Ports Pulmonary Rehab Walk Test from 03/12/2024 in [facility] for Heart, Vascular, & Lung Health  Referring Provider Dr. Alaine    Encounter Date: 03/29/2024  Check In:  Session Check In - 03/29/24 1501       Check-In   Supervising physician immediately available to respond to emergencies [facility] MD immediately available    Physician(s) Damien Braver, NP    [facility]    Staff Present Ronal Levin, RN, BSN;Payzlee Ryder Claudene, Neita Moats, MS, ACSM-CEP, Exercise Physiologist;Johnny Fayette, MS, Exercise Physiologist    Virtual Visit No    Medication changes reported     No    Fall or balance concerns reported    No    Tobacco Cessation No Change    Warm-up and Cool-down Performed as group-led instruction    Resistance Training Performed Yes    VAD Patient? No    PAD/SET Patient? No      Pain Assessment   Currently in Pain? No/denies    Multiple Pain Sites No          Capillary Blood Glucose: No results found for this or any previous visit (from the past 24 hours).    Social History   Tobacco Use  Smoking Status Former   Current packs/day: 0.00   Types: Cigarettes   Quit date: 02/21/1963   Years since quitting: 61.1  Smokeless Tobacco Never  Tobacco Comments   in college    Goals Met:  Proper associated with RPD/PD & O2 Sat Independence with exercise equipment Exercise tolerated well No report of concerns or symptoms today Strength training completed today  Goals Unmet:  Not Applicable  Comments: Service time is from 1314 to 1430.    Dr. Slater Staff is Medical Director for Pulmonary Rehab at [facility].

## 2024-04-03 ENCOUNTER — Encounter (HOSPITAL_COMMUNITY)
Admission: RE | Admit: 2024-04-03 | Discharge: 2024-04-03 | Disposition: A | Discharge status: Home / Self Care | Source: Ambulatory Visit | Attending: Pulmonary Disease | Admitting: Pulmonary Disease

## 2024-04-03 VITALS — Wt 145.1 lb

## 2024-04-03 DIAGNOSIS — C3492 Malignant neoplasm of unspecified part of left bronchus or lung: Secondary | ICD-10-CM | POA: Diagnosis not present

## 2024-04-03 DIAGNOSIS — R911 Solitary pulmonary nodule: Secondary | ICD-10-CM | POA: Diagnosis not present

## 2024-04-03 DIAGNOSIS — J432 Centrilobular emphysema: Secondary | ICD-10-CM

## 2024-04-03 NOTE — Progress Notes (Signed)
 Daily Session Note  Patient Details  Name: Marissa Jacobs MRN: [id number] Date of Birth: 09-18-1935 Referring Provider:   Conrad Ports Pulmonary Rehab Walk Test from 03/12/2024 in [facility] for Heart, Vascular, & Lung Health  Referring Provider Dr. Alaine    Encounter Date: 04/03/2024  Check In:  Session Check In - 04/03/24 1435       Check-In   Supervising physician immediately available to respond to emergencies [facility] MD immediately available    Physician(s) Lum Louis, NP    [facility]    Staff Present Ronal Levin, RN, BSN;Kalliope Riesen Claudene, Neita Moats, MS, ACSM-CEP, Exercise Physiologist;Joseph Lennon, RN, BSN    Virtual Visit No    Medication changes reported     No    Fall or balance concerns reported    No    Tobacco Cessation No Change    Warm-up and Cool-down Performed as group-led instruction    Resistance Training Performed Yes    VAD Patient? No    PAD/SET Patient? No      Pain Assessment   Currently in Pain? No/denies          Capillary Blood Glucose: No results found for this or any previous visit (from the past 24 hours).   Exercise Prescription Changes - 04/03/24 1500       Response to Exercise   Blood Pressure (Admit) 140/66    Blood Pressure (Exercise) 168/70    Blood Pressure (Exit) 138/64    Heart Rate (Admit) 72 bpm    Heart Rate (Exercise) 98 bpm    Heart Rate (Exit) 68 bpm    Oxygen Saturation (Admit) 98 %    Oxygen Saturation (Exercise) 97 %    Oxygen Saturation (Exit) 97 %    Rating of Perceived Exertion (Exercise) 12    Perceived Dyspnea (Exercise) 0    Duration Continue with 30 min of aerobic exercise without signs/symptoms of physical distress.    Intensity THRR unchanged      Progression   Progression Continue to progress workloads to maintain intensity without signs/symptoms of physical distress.      Resistance Training   Training Prescription Yes    Weight red bands     Reps 10-15    Time 10 Minutes      Recumbant Bike   Level 2    RPM 51    Watts 17    Minutes 15    METs 2.2      NuStep   Level 3    SPM 85    Minutes 15    METs 2.8          Social History   Tobacco Use  Smoking Status Former   Current packs/day: 0.00   Types: Cigarettes   Quit date: 02/21/1963   Years since quitting: 61.1  Smokeless Tobacco Never  Tobacco Comments   in college    Goals Met:  Proper associated with RPD/PD & O2 Sat Independence with exercise equipment Exercise tolerated well No report of concerns or symptoms today Strength training completed today  Goals Unmet:  Not Applicable  Comments: Service time is from 1318 to 1440.    Dr. Slater Staff is Medical Director for Pulmonary Rehab at [facility]

## 2024-04-04 DIAGNOSIS — M7989 Other specified soft tissue disorders: Secondary | ICD-10-CM | POA: Diagnosis not present

## 2024-04-04 DIAGNOSIS — T63481A Toxic effect of venom of other arthropod, accidental (unintentional), initial encounter: Secondary | ICD-10-CM | POA: Diagnosis not present

## 2024-04-05 ENCOUNTER — Encounter (HOSPITAL_COMMUNITY)
Admission: RE | Admit: 2024-04-05 | Discharge: 2024-04-05 | Disposition: A | Discharge status: Home / Self Care | Source: Ambulatory Visit | Attending: Pulmonary Disease | Admitting: Pulmonary Disease

## 2024-04-05 DIAGNOSIS — J432 Centrilobular emphysema: Secondary | ICD-10-CM | POA: Diagnosis not present

## 2024-04-05 DIAGNOSIS — C3492 Malignant neoplasm of unspecified part of left bronchus or lung: Secondary | ICD-10-CM | POA: Diagnosis not present

## 2024-04-05 NOTE — Progress Notes (Signed)
 Daily Session Note  Patient Details  Name: MERCEDEZ BOULE MRN: [id number] Date of Birth: 12-19-1935 Referring Provider:   Conrad Ports Pulmonary Rehab Walk Test from 03/12/2024 in [facility] for Heart, Vascular, & Lung [facility]  Referring Provider Dr. Alaine    Encounter Date: 04/05/2024  Check In:  Session Check In - 04/05/24 1408       Check-In   Supervising physician immediately available to respond to emergencies [facility] MD immediately available    Physician(s) Rosaline Bane, NP    [facility]    Staff Present Ronal Levin, RN, BSN;Casey Claudene, Neita Moats, MS, ACSM-CEP, Exercise Physiologist;Randi Midge BS, ACSM-CEP, Exercise Physiologist    Virtual Visit No    Medication changes reported     No    Fall or balance concerns reported    No    Tobacco Cessation No Change    Warm-up and Cool-down Performed as group-led instruction    Resistance Training Performed Yes    VAD Patient? No    PAD/SET Patient? No      Pain Assessment   Currently in Pain? No/denies    Multiple Pain Sites No          Capillary Blood Glucose: No results found for this or any previous visit (from the past 24 hours).    Social History   Tobacco Use  Smoking Status Former   Current packs/day: 0.00   Types: Cigarettes   Quit date: 02/21/1963   Years since quitting: 61.1  Smokeless Tobacco Never  Tobacco Comments   in college    Goals Met:  Exercise tolerated well No report of concerns or symptoms today Strength training completed today  Goals Unmet:  Not Applicable  Comments: Service time is from 1326 to 1431    Dr. Slater Staff is Medical Director for Pulmonary Rehab at [facility].

## 2024-04-10 ENCOUNTER — Encounter (HOSPITAL_COMMUNITY)
Admission: RE | Admit: 2024-04-10 | Discharge: 2024-04-10 | Disposition: A | Discharge status: Home / Self Care | Source: Ambulatory Visit | Attending: Pulmonary Disease | Admitting: Pulmonary Disease

## 2024-04-10 DIAGNOSIS — C3492 Malignant neoplasm of unspecified part of left bronchus or lung: Secondary | ICD-10-CM | POA: Diagnosis not present

## 2024-04-10 DIAGNOSIS — J432 Centrilobular emphysema: Secondary | ICD-10-CM

## 2024-04-10 NOTE — Progress Notes (Signed)
 Daily Session Note  Patient Details  Name: Marissa Jacobs MRN: [id number] Date of Birth: Mar 02, 1936 Referring Provider:   Conrad Ports Pulmonary Rehab Walk Test from 03/12/2024 in [facility] for Heart, Vascular, & Lung Health  Referring Provider Dr. Alaine    Encounter Date: 04/10/2024  Check In:  Session Check In - 04/10/24 1330       Check-In   Supervising physician immediately available to respond to emergencies [facility] MD immediately available    Physician(s) Rosaline Bane, NP    [facility]    Staff Present Ronal Levin, RN, BSN;Casey Claudene, Neita Moats, MS, ACSM-CEP, Exercise Physiologist;Kwabena Strutz Midge BS, ACSM-CEP, Exercise Physiologist    Virtual Visit No    Medication changes reported     No    Fall or balance concerns reported    No    Tobacco Cessation No Change    Warm-up and Cool-down Performed as group-led instruction    Resistance Training Performed Yes    VAD Patient? No    PAD/SET Patient? No      Pain Assessment   Currently in Pain? No/denies    Multiple Pain Sites No          Capillary Blood Glucose: No results found for this or any previous visit (from the past 24 hours).    Social History   Tobacco Use  Smoking Status Former   Current packs/day: 0.00   Types: Cigarettes   Quit date: 02/21/1963   Years since quitting: 61.1  Smokeless Tobacco Never  Tobacco Comments   in college    Goals Met:  Independence with exercise equipment Exercise tolerated well No report of concerns or symptoms today Strength training completed today  Goals Unmet:  Not Applicable  Comments: Service time is from 1308 to 1436.    Dr. Slater Staff is Medical Director for Pulmonary Rehab at [facility].

## 2024-04-12 ENCOUNTER — Encounter (HOSPITAL_COMMUNITY)

## 2024-04-17 ENCOUNTER — Encounter (HOSPITAL_COMMUNITY)
Admission: RE | Admit: 2024-04-17 | Discharge: 2024-04-17 | Disposition: A | Discharge status: Home / Self Care | Source: Ambulatory Visit | Attending: Pulmonary Disease | Admitting: Pulmonary Disease

## 2024-04-17 VITALS — Wt 142.6 lb

## 2024-04-17 DIAGNOSIS — J432 Centrilobular emphysema: Secondary | ICD-10-CM | POA: Insufficient documentation

## 2024-04-17 DIAGNOSIS — C3492 Malignant neoplasm of unspecified part of left bronchus or lung: Secondary | ICD-10-CM | POA: Insufficient documentation

## 2024-04-17 NOTE — Progress Notes (Signed)
 Daily Session Note  Patient Details  Name: Marissa Jacobs MRN: [id number] Date of Birth: 1936/02/01 Referring Provider:   Conrad Ports Pulmonary Rehab Walk Test from 03/12/2024 in [facility], Vascular, & [facility]  Referring Provider Dr. Alaine    Encounter Date: 04/17/2024  Check In:  Session Check In - 04/17/24 1327       Check-In   Supervising physician immediately available to respond to emergencies [facility] MD immediately available    Physician(s) Josefa Beauvais, NP    [facility]    Staff Present Ronal Levin, RN, BSN;Casey Claudene, Neita Moats, MS, ACSM-CEP, Exercise Physiologist;Randi Midge BS, ACSM-CEP, Exercise Physiologist    Virtual Visit No    Medication changes reported     No    Fall or balance concerns reported    No    Tobacco Cessation No Change    Warm-up and Cool-down Performed as group-led instruction    Resistance Training Performed Yes    VAD Patient? No    PAD/SET Patient? No      Pain Assessment   Currently in Pain? No/denies    Multiple Pain Sites No          Capillary Blood Glucose: No results found for this or any previous visit (from the past 24 hours).   Exercise Prescription Changes - 04/17/24 1400       Response to Exercise   Blood Pressure (Admit) 156/70    Blood Pressure (Exercise) 142/76    Blood Pressure (Exit) 122/62    Heart Rate (Admit) 82 bpm    Heart Rate (Exercise) 94 bpm    Heart Rate (Exit) 71 bpm    Oxygen Saturation (Admit) 95 %    Oxygen Saturation (Exercise) 96 %    Oxygen Saturation (Exit) 96 %    Rating of Perceived Exertion (Exercise) 13    Perceived Dyspnea (Exercise) 3    Duration Continue with 30 min of aerobic exercise without signs/symptoms of physical distress.    Intensity THRR unchanged      Progression   Progression Continue to progress workloads to maintain intensity without signs/symptoms of physical distress.      Resistance Training    Training Prescription Yes    Weight red bands    Reps 10-15    Time 10 Minutes      Recumbant Bike   Level 3    RPM 48    Watts 13    Minutes 15    METs 2.3      NuStep   Level 3    SPM 71    Minutes 15    METs 2.4          Social History   Tobacco Use  Smoking Status Former   Current packs/day: 0.00   Types: Cigarettes   Quit date: 02/21/1963   Years since quitting: 61.1  Smokeless Tobacco Never  Tobacco Comments   in college    Goals Met:  Exercise tolerated well Queuing for purse lip breathing No report of concerns or symptoms today Strength training completed today  Goals Unmet:  Not Applicable  Comments: Service time is from 1316 to 1432    Dr. Slater Staff is Medical Director for Pulmonary Rehab at [facility].

## 2024-04-18 NOTE — Progress Notes (Signed)
 Pulmonary Individual Treatment Plan  Patient Details  Name: Marissa Jacobs MRN: [id number] Date of Birth: 1936-01-26 Referring Provider:   Conrad Ports Pulmonary Rehab Walk Test from 03/12/2024 in [facility]  Referring Provider Dr. Alaine    Initial Encounter Date:  Flowsheet Row Pulmonary Rehab Walk Test from 03/12/2024 in [facility]  Date 03/12/24    Visit Diagnosis: Centrilobular emphysema (HCC)  Patient's Home Medications on Admission:  Current Outpatient Medications:    Ascorbic Acid (VITAMIN C) 500 MG CAPS, Take 1 Capful by mouth daily., Disp: , Rfl:    Cholecalciferol 125 MCG (5000 UT) TABS, Take 1 tablet by mouth daily., Disp: , Rfl:    Cobalamin Combinations (B12 FOLATE) 800-800 MCG CAPS, 1 tablet Orally Once a day, Disp: , Rfl:    Coenzyme Q10 (CO Q 10 PO), Take 1 tablet by mouth daily., Disp: , Rfl:    EPIPEN 2-PAK 0.3 MG/0.3ML SOAJ injection, Inject 0.3 mg into the muscle as needed for anaphylaxis [employer] certified accountant)., Disp: , Rfl:    estradiol  (VIVELLE -DOT) 0.025 MG/24HR, Place 1 patch onto the skin 2 (two) times a week., Disp: 24 patch, Rfl: 3   fexofenadine (ALLEGRA) 180 MG tablet, Take 180 mg by mouth daily as needed for allergies., Disp: , Rfl:    Grape Seed Extract 100 MG CAPS, Take 1 capsule by mouth daily., Disp: , Rfl:    hydrochlorothiazide  (MICROZIDE ) 12.5 MG capsule, Take 12.5 mg by mouth daily., Disp: , Rfl:    losartan  (COZAAR ) 100 MG tablet, Take 1 tablet (100 mg total) by mouth daily., Disp: 90 tablet, Rfl: 3   MAGNESIUM GLYCINATE PO, Take 1 tablet by mouth daily., Disp: , Rfl:    Multiple Vitamin (MULTIVITAMIN) capsule, Take 1 capsule by mouth daily., Disp: , Rfl:    nitroGLYCERIN  (NITROSTAT ) 0.4 MG SL tablet, Place 1 tablet (0.4 mg total) under the tongue every 5 (five) minutes as needed for chest pain. (Patient not taking: Reported on  03/12/2024), Disp: 30 tablet, Rfl: 0   Omega-3 1000 MG CAPS, Take 1 capsule by mouth daily at 6 (six) AM., Disp: , Rfl:    Omega-3 Fatty Acids (OMEGA-3 FISH OIL PO), Take 1,600 mg by mouth daily., Disp: , Rfl:    omeprazole (PRILOSEC) 20 MG capsule, Take 20 mg by mouth every other day., Disp: , Rfl:    progesterone  (PROMETRIUM ) 100 MG capsule, Take 100 mg by mouth daily., Disp: , Rfl:    pyridoxine (B-6) 100 MG tablet, Take 100 mg by mouth daily., Disp: , Rfl:    tretinoin (RETIN-A) 0.025 % cream, Apply 1 application topically at bedtime. , Disp: , Rfl:    vitamin k 100 MCG tablet, Take 100 mcg by mouth daily. Take after taking vitamin D3., Disp: , Rfl:    Zinc 50 MG TABS, Take 1 tablet by mouth daily., Disp: , Rfl:   Past Medical History: Past Medical History:  Diagnosis Date   Anemia    secondary to taking omeprazole-on iron   Cancer (HCC) 08/2020   lung   Diverticulosis 09/2002   Dysrhythmia    a-fib after surgery 09/04/20. treated with Metoprolol  for 6 week   GERD (gastroesophageal reflux disease)    Hyperlipidemia    Hypertension    Lung nodule    Osteopenia    Post traumatic stress disorder    STD (sexually transmitted disease)    HSV  Tobacco Use: Social History   Tobacco Use  Smoking Status Former   Current packs/day: 0.00   Types: Cigarettes   Quit date: 02/21/1963   Years since quitting: 61.1  Smokeless Tobacco Never  Tobacco Comments   in college    Labs: Review Flowsheet        No data to display           Pulmonary Assessment Scores:  Pulmonary Assessment Scores     Row Name 03/12/24 1513         ADL UCSD   ADL Phase Entry     SOB Score total 2       CAT Score   CAT Score 2       mMRC Score   mMRC Score 1        UCSD: Self-administered rating of dyspnea associated with activities of daily living (ADLs) 6-point scale (0 = not at all to 5 = maximal or unable to do because of breathlessness)  Scoring Scores range from 0 to 120.   Minimally important difference is 5 units  CAT: CAT can identify the health impairment of COPD patients and is better correlated with disease progression.  CAT has a scoring range of zero to 40. The CAT score is classified into four groups of low (less than 10), medium (10 - 20), high (21-30) and very high (31-40) based on the impact level of disease on health status. A CAT score over 10 suggests significant symptoms.  A worsening CAT score could be explained by an exacerbation, poor medication adherence, poor inhaler technique, or progression of COPD or comorbid conditions.  CAT MCID is 2 points  mMRC: mMRC (Modified Medical Research Council) Dyspnea Scale is used to assess the degree of baseline functional disability in patients of respiratory disease due to dyspnea. No minimal important difference is established. A decrease in score of 1 point or greater is considered a positive change.   Pulmonary Function Assessment:  Pulmonary Function Assessment - 03/12/24 1422       Breath   Bilateral Breath Sounds Clear    Shortness of Breath No          Exercise Target Goals: Exercise Program Goal: Individual exercise prescription set using results from initial 6 min walk test and THRR while considering  patient's activity barriers and safety.   Exercise Prescription Goal: Initial exercise prescription builds to 30-45 minutes a day of aerobic activity, 2-3 days per week.  Home exercise guidelines will be given to patient during program as part of exercise prescription that the participant will acknowledge.  Education: Aerobic Exercise: - Group verbal and visual presentation on the components of exercise prescription. Introduces F.I.T.T principle from ACSM for exercise prescriptions.  Reviews F.I.T.T. principles of aerobic exercise including progression. Written material provided at class time.   Education: Resistance Exercise: - Group verbal and visual presentation on the components of  exercise prescription. Introduces F.I.T.T principle from ACSM for exercise prescriptions  Reviews F.I.T.T. principles of resistance exercise including progression. Written material provided at class time.    Education: Exercise & Equipment Safety: - Individual verbal instruction and demonstration of equipment use and safety with use of the equipment.   Education: Exercise Physiology & General Exercise Guidelines: - Group verbal and written instruction with models to review the exercise physiology of the cardiovascular system and associated critical values. Provides general exercise guidelines with specific guidelines to those with heart or lung disease.    Education: Flexibility, Balance, Mind/Body Relaxation: -  Group verbal and visual presentation with interactive activity on the components of exercise prescription. Introduces F.I.T.T principle from ACSM for exercise prescriptions. Reviews F.I.T.T. principles of flexibility and balance exercise training including progression. Also discusses the mind body connection.  Reviews various relaxation techniques to help reduce and manage stress (i.e. Deep breathing, progressive muscle relaxation, and visualization). Balance handout provided to take home. Written material provided at class time.   Activity Barriers & Risk Stratification:  Activity Barriers & Cardiac Risk Stratification - 03/12/24 1414       Activity Barriers & Cardiac Risk Stratification   Activity Barriers Deconditioning;Muscular Weakness;Shortness of Breath;Left Knee Replacement    Cardiac Risk Stratification Moderate          6 Minute Walk:  6 Minute Walk     Row Name 03/12/24 1518         6 Minute Walk   Phase Initial     Distance 1096 feet     Walk Time 6 minutes     # of Rest Breaks 0     METS 2.08     RPE 11     Perceived Dyspnea  0     VO2 Peak 7.79     Symptoms No     Resting HR 71 bpm     Resting BP 148/66     Resting Oxygen Saturation  99 %      Exercise Oxygen Saturation  during 6 min walk 96 %     Max Ex. HR 111 bpm     Max Ex. BP 190/60     2 Minute Post BP 160/60       Interval HR   1 Minute HR 90     2 Minute HR 105     3 Minute HR 111     4 Minute HR 107     5 Minute HR 109     6 Minute HR 110     2 Minute Post HR 72     Interval Heart Rate? Yes       Interval Oxygen   Interval Oxygen? Yes     Baseline Oxygen Saturation % 99 %     1 Minute Oxygen Saturation % 97 %     1 Minute Liters of Oxygen 0 L     2 Minute Oxygen Saturation % 96 %     2 Minute Liters of Oxygen 0 L     3 Minute Oxygen Saturation % 97 %     3 Minute Liters of Oxygen 0 L     4 Minute Oxygen Saturation % 96 %     4 Minute Liters of Oxygen 0 L     5 Minute Oxygen Saturation % 97 %     5 Minute Liters of Oxygen 0 L     6 Minute Oxygen Saturation % 96 %     6 Minute Liters of Oxygen 0 L     2 Minute Post Oxygen Saturation % 98 %     2 Minute Post Liters of Oxygen 0 L       Oxygen Initial Assessment:  Oxygen Initial Assessment - 03/12/24 1420       Home Oxygen   Home Oxygen Device None    Sleep Oxygen Prescription None    Home Exercise Oxygen Prescription None    Home Resting Oxygen Prescription None      Initial 6 min Walk   Oxygen Used None  Program Oxygen Prescription   Program Oxygen Prescription None      Intervention   Short Term Goals To learn and understand importance of monitoring SPO2 with pulse oximeter and demonstrate accurate use of the pulse oximeter.;To learn and understand importance of maintaining oxygen saturations>88%;To learn and demonstrate proper pursed lip breathing techniques or other breathing techniques. ;To learn and demonstrate proper use of respiratory medications    Long  Term Goals Maintenance of O2 saturations>88%;Compliance with respiratory medication;Verbalizes importance of monitoring SPO2 with pulse oximeter and return demonstration;Exhibits proper breathing techniques, such as pursed lip breathing  or other method taught during program session;Demonstrates proper use of MDI's          Oxygen Re-Evaluation:  Oxygen Re-Evaluation     Row Name 03/20/24 0912 04/17/24 0847           Program Oxygen Prescription   Program Oxygen Prescription None None        Home Oxygen   Home Oxygen Device None None      Sleep Oxygen Prescription None None      Home Exercise Oxygen Prescription None None      Home Resting Oxygen Prescription None None        Goals/Expected Outcomes   Short Term Goals To learn and understand importance of monitoring SPO2 with pulse oximeter and demonstrate accurate use of the pulse oximeter.;To learn and understand importance of maintaining oxygen saturations>88%;To learn and demonstrate proper pursed lip breathing techniques or other breathing techniques. ;To learn and demonstrate proper use of respiratory medications To learn and understand importance of monitoring SPO2 with pulse oximeter and demonstrate accurate use of the pulse oximeter.;To learn and understand importance of maintaining oxygen saturations>88%;To learn and demonstrate proper pursed lip breathing techniques or other breathing techniques. ;To learn and demonstrate proper use of respiratory medications      Long  Term Goals Maintenance of O2 saturations>88%;Compliance with respiratory medication;Verbalizes importance of monitoring SPO2 with pulse oximeter and return demonstration;Exhibits proper breathing techniques, such as pursed lip breathing or other method taught during program session;Demonstrates proper use of MDI's Maintenance of O2 saturations>88%;Compliance with respiratory medication;Verbalizes importance of monitoring SPO2 with pulse oximeter and return demonstration;Exhibits proper breathing techniques, such as pursed lip breathing or other method taught during program session;Demonstrates proper use of MDI's      Goals/Expected Outcomes Compliance and understanding of oxygen saturation  monitoring and breathing techniques to decrease shortness of breath. Compliance and understanding of oxygen saturation monitoring and breathing techniques to decrease shortness of breath.         Oxygen Discharge (Final Oxygen Re-Evaluation):  Oxygen Re-Evaluation - 04/17/24 0847       Program Oxygen Prescription   Program Oxygen Prescription None      Home Oxygen   Home Oxygen Device None    Sleep Oxygen Prescription None    Home Exercise Oxygen Prescription None    Home Resting Oxygen Prescription None      Goals/Expected Outcomes   Short Term Goals To learn and understand importance of monitoring SPO2 with pulse oximeter and demonstrate accurate use of the pulse oximeter.;To learn and understand importance of maintaining oxygen saturations>88%;To learn and demonstrate proper pursed lip breathing techniques or other breathing techniques. ;To learn and demonstrate proper use of respiratory medications    Long  Term Goals Maintenance of O2 saturations>88%;Compliance with respiratory medication;Verbalizes importance of monitoring SPO2 with pulse oximeter and return demonstration;Exhibits proper breathing techniques, such as pursed lip breathing or other method taught during  program session;Demonstrates proper use of MDI's    Goals/Expected Outcomes Compliance and understanding of oxygen saturation monitoring and breathing techniques to decrease shortness of breath.          Initial Exercise Prescription:  Initial Exercise Prescription - 03/12/24 1500       Date of Initial Exercise RX and Referring Provider   Date 03/12/24    Referring Provider Dr. Alaine    Expected Discharge Date 06/07/24      Recumbant Bike   Level 1    RPM 50    Watts 40    Minutes 15    METs 1.9      NuStep   Level 1    SPM 64    Minutes 15    METs 1.9      Prescription Details   Frequency (times per week) 2    Duration Progress to 30 minutes of continuous aerobic without signs/symptoms of  physical distress      Intensity   THRR 40-80% of Max Heartrate 54-107    Ratings of Perceived Exertion 11-13    Perceived Dyspnea 0-4      Progression   Progression Continue to progress workloads to maintain intensity without signs/symptoms of physical distress.      Resistance Training   Training Prescription Yes    Weight red bands    Reps 10-15          Perform Capillary Blood Glucose checks as needed.  Exercise Prescription Changes:   Exercise Prescription Changes     Row Name 04/03/24 1500 04/17/24 1400           Response to Exercise   Blood Pressure (Admit) 140/66 156/70      Blood Pressure (Exercise) 168/70 142/76      Blood Pressure (Exit) 138/64 122/62      Heart Rate (Admit) 72 bpm 82 bpm      Heart Rate (Exercise) 98 bpm 94 bpm      Heart Rate (Exit) 68 bpm 71 bpm      Oxygen Saturation (Admit) 98 % 95 %      Oxygen Saturation (Exercise) 97 % 96 %      Oxygen Saturation (Exit) 97 % 96 %      Rating of Perceived Exertion (Exercise) 12 13      Perceived Dyspnea (Exercise) 0 3      Duration Continue with 30 min of aerobic exercise without signs/symptoms of physical distress. Continue with 30 min of aerobic exercise without signs/symptoms of physical distress.      Intensity THRR unchanged THRR unchanged        Progression   Progression Continue to progress workloads to maintain intensity without signs/symptoms of physical distress. Continue to progress workloads to maintain intensity without signs/symptoms of physical distress.        Resistance Training   Training Prescription Yes Yes      Weight red bands red bands      Reps 10-15 10-15      Time 10 Minutes 10 Minutes        Recumbant Bike   Level 2 3      RPM 51 48      Watts 17 13      Minutes 15 15      METs 2.2 2.3        NuStep   Level 3 3      SPM 85 71      Minutes 15 15  METs 2.8 2.4         Exercise Comments:   Exercise Comments     Row Name 03/22/24 1505            Exercise Comments Pt completed first day of exercise. She exercised for 15 min on the Nustep and recumbent bike. She performed the warmup and cooldown standing without limitations. Discussed METs with good reception.          Exercise Goals and Review:   Exercise Goals     Row Name 03/12/24 1414             Exercise Goals   Increase Physical Activity Yes       Intervention Provide advice, education, support and counseling about physical activity/exercise needs.;Develop an individualized exercise prescription for aerobic and resistive training based on initial evaluation findings, risk stratification, comorbidities and participant's personal goals.       Expected Outcomes Short Term: Attend rehab on a regular basis to increase amount of physical activity.;Long Term: Add in home exercise to make exercise part of routine and to increase amount of physical activity.;Long Term: Exercising regularly at least 3-5 days a week.       Increase Strength and Stamina Yes       Intervention Provide advice, education, support and counseling about physical activity/exercise needs.;Develop an individualized exercise prescription for aerobic and resistive training based on initial evaluation findings, risk stratification, comorbidities and participant's personal goals.       Expected Outcomes Short Term: Increase workloads from initial exercise prescription for resistance, speed, and METs.;Short Term: Perform resistance training exercises routinely during rehab and add in resistance training at home;Long Term: Improve cardiorespiratory fitness, muscular endurance and strength as measured by increased METs and functional capacity ( )       Able to understand and use rate of perceived exertion (RPE) scale Yes       Intervention Provide education and explanation on how to use RPE scale       Expected Outcomes Short Term: Able to use RPE daily in rehab to express subjective intensity level;Long Term:  Able to  use RPE to guide intensity level when exercising independently       Able to understand and use Dyspnea scale Yes       Intervention Provide education and explanation on how to use Dyspnea scale       Expected Outcomes Short Term: Able to use Dyspnea scale daily in rehab to express subjective sense of shortness of breath during exertion;Long Term: Able to use Dyspnea scale to guide intensity level when exercising independently       Knowledge and understanding of Target Heart Rate Range (THRR) Yes       Intervention Provide education and explanation of THRR including how the numbers were predicted and where they are located for reference       Expected Outcomes Short Term: Able to state/look up THRR;Long Term: Able to use THRR to govern intensity when exercising independently;Short Term: Able to use daily as guideline for intensity in rehab       Understanding of Exercise Prescription Yes       Intervention Provide education, explanation, and written materials on patient's individual exercise prescription       Expected Outcomes Short Term: Able to explain program exercise prescription;Long Term: Able to explain home exercise prescription to exercise independently          Exercise Goals Re-Evaluation :  Exercise Goals Re-Evaluation  Row Name 03/20/24 0911 04/17/24 0844           Exercise Goal Re-Evaluation   Exercise Goals Review Increase Physical Activity;Able to understand and use Dyspnea scale;Understanding of Exercise Prescription;Increase Strength and Stamina;Knowledge and understanding of Target Heart Rate Range (THRR);Able to understand and use rate of perceived exertion (RPE) scale Increase Physical Activity;Able to understand and use Dyspnea scale;Understanding of Exercise Prescription;Increase Strength and Stamina;Knowledge and understanding of Target Heart Rate Range (THRR);Able to understand and use rate of perceived exertion (RPE) scale      Comments Pt is scheduled to begin  exercise 8/7. Will progress as tolerated. Pt has completed 5 exercise sessions, missing 2. She is exercising on the recumbent stepper for 15 min, level 3, METs 2.6. She then is exercising on the recumbent bike for 15 min, level 2, METs 2.6. She tried level 3 last session but felt it was too hard. She performs warm up and cool down without significant limitations, using blue bands, 5.8 lbs. Will progress as tolerated.      Expected Outcomes Through exercise at rehab and home, the patient will decrease shortness of breath and feel confident in carrying out an exercise regimen at home. Through exercise at rehab and home, the patient will decrease shortness of breath and feel confident in carrying out an exercise regimen at home.         Discharge Exercise Prescription (Final Exercise Prescription Changes):  Exercise Prescription Changes - 04/17/24 1400       Response to Exercise   Blood Pressure (Admit) 156/70    Blood Pressure (Exercise) 142/76    Blood Pressure (Exit) 122/62    Heart Rate (Admit) 82 bpm    Heart Rate (Exercise) 94 bpm    Heart Rate (Exit) 71 bpm    Oxygen Saturation (Admit) 95 %    Oxygen Saturation (Exercise) 96 %    Oxygen Saturation (Exit) 96 %    Rating of Perceived Exertion (Exercise) 13    Perceived Dyspnea (Exercise) 3    Duration Continue with 30 min of aerobic exercise without signs/symptoms of physical distress.    Intensity THRR unchanged      Progression   Progression Continue to progress workloads to maintain intensity without signs/symptoms of physical distress.      Resistance Training   Training Prescription Yes    Weight red bands    Reps 10-15    Time 10 Minutes      Recumbant Bike   Level 3    RPM 48    Watts 13    Minutes 15    METs 2.3      NuStep   Level 3    SPM 71    Minutes 15    METs 2.4          Nutrition:  Target Goals: Understanding of nutrition guidelines, daily intake of sodium 1500mg , cholesterol 200mg , calories 30%  from fat and 7% or less from saturated fats, daily to have 5 or more servings of fruits and vegetables.  Education: Nutrition 1 -Group instruction provided by verbal, written material, interactive activities, discussions, models, and posters to present general guidelines for heart healthy nutrition including macronutrients, label reading, and promoting whole foods over processed counterparts. Education serves as Pensions consultant of discussion of heart healthy eating for all. Written material provided at class time.     Education: Nutrition 2 -Group instruction provided by verbal, written material, interactive activities, discussions, models, and posters to present general guidelines for  heart healthy nutrition including sodium, cholesterol, and saturated fat. Providing guidance of habit forming to improve blood pressure, cholesterol, and body weight. Written material provided at class time.     Biometrics:  Pre Biometrics - 03/12/24 1513       Pre Biometrics   Grip Strength 20 kg           Nutrition Therapy Plan and Nutrition Goals:  Nutrition Therapy & Goals - 03/22/24 1439       Nutrition Therapy   Diet General Healthy Diet      Personal Nutrition Goals   Nutrition Goal Patient to improve diet quality by using the plate method as a guide for meal planning to include lean protein/plant protein, fruits, vegetables, whole grains, nonfat dairy as part of a well-balanced diet.    Comments Breta has medical hisory of HTN, hyperlipidemia, lung cancer. She follows a normal diet. Per documentation with cardiology on 08/23/23, she declines statin. BMI remains appropriate for age. Patient will benefit from participation in pulmonary rehab for nutrition education, exercise, and lifestyle modification.      Intervention Plan   Intervention Prescribe, educate and counsel regarding individualized specific dietary modifications aiming towards targeted core components such as weight, hypertension,  lipid management, diabetes, heart failure and other comorbidities.;Nutrition handout(s) given to patient.    Expected Outcomes Short Term Goal: Understand basic principles of dietary content, such as calories, fat, sodium, cholesterol and nutrients.;Long Term Goal: Adherence to prescribed nutrition plan.          Nutrition Assessments:  Nutrition Assessments - 03/29/24 0922       Rate Your Plate Scores   Pre Score 70         MEDIFICTS Score Key: >=70 Need to make dietary changes  40-70 Heart Healthy Diet <= 40 Therapeutic Level Cholesterol Diet  Flowsheet Row PULMONARY REHAB OTHER RESPIRATORY from 03/27/2024 in [facility]  Picture Your Plate Total Score on Admission 70   Picture Your Plate Scores: <59 Unhealthy dietary pattern with much room for improvement. 41-50 Dietary pattern unlikely to meet recommendations for good health and room for improvement. 51-60 More healthful dietary pattern, with some room for improvement.  >60 Healthy dietary pattern, although there may be some specific behaviors that could be improved.   Nutrition Goals Re-Evaluation:  Nutrition Goals Re-Evaluation     Row Name 03/22/24 1439             Goals   Current Weight 143 lb 15.4 oz (65.3 kg)       Comment no recent lipid panel avaiable for review       Expected Outcome Keyarah has medical hisory of HTN, hyperlipidemia, lung cancer. She follows a normal diet. Per documentation with cardiology on 08/23/23, she declines statin. BMI remains appropriate for age. Patient will benefit from participation in pulmonary rehab for nutrition education, exercise, and lifestyle modification.          Nutrition Goals Discharge (Final Nutrition Goals Re-Evaluation):  Nutrition Goals Re-Evaluation - 03/22/24 1439       Goals   Current Weight 143 lb 15.4 oz (65.3 kg)    Comment no recent lipid panel avaiable for review    Expected Outcome Sharise has  medical hisory of HTN, hyperlipidemia, lung cancer. She follows a normal diet. Per documentation with cardiology on 08/23/23, she declines statin. BMI remains appropriate for age. Patient will benefit from participation in pulmonary rehab for nutrition education, exercise,  and lifestyle modification.          Psychosocial: Target Goals: Acknowledge presence or absence of significant depression and/or stress, maximize coping skills, provide positive support system. Participant is able to verbalize types and ability to use techniques and skills needed for reducing stress and depression.   Education: Stress, Anxiety, and Depression - Group verbal and visual presentation to define topics covered.  Reviews how body is impacted by stress, anxiety, and depression.  Also discusses healthy ways to reduce stress and to treat/manage anxiety and depression.  Written material provided at class time.   Education: Sleep Hygiene -Provides group verbal and written instruction about how sleep can affect your health.  Define sleep hygiene, discuss sleep cycles and impact of sleep habits. Review good sleep hygiene tips.    Initial Review & Psychosocial Screening:  Initial Psych Review & Screening - 03/12/24 1405       Initial Review   Current issues with None Identified      Family Dynamics   Good Support System? Yes      Barriers   Psychosocial barriers to participate in program There are no identifiable barriers or psychosocial needs.      Screening Interventions   Interventions Encouraged to exercise          Quality of Life Scores:  Scores of 19 and below usually indicate a poorer quality of life in these areas.  A difference of  2-3 points is a clinically meaningful difference.  A difference of 2-3 points in the total score of the Quality of Life Index has been associated with significant improvement in overall quality of life, self-image, physical symptoms, and general health in studies  assessing change in quality of life.  PHQ-9: Review Flowsheet  More data may exist      03/12/2024 07/06/2023 04/22/2023 06/21/2022 07/13/2021  Depression screen PHQ 2/9  Decreased Interest 0 0 0 0 0  Down, Depressed, Hopeless 0 0 0 0 0  PHQ - 2 Score 0 0 0 0 0  Altered sleeping 0 - - - -  Tired, decreased energy 0 - - - -  Change in appetite 0 - - - -  Feeling bad or failure about yourself  0 - - - -  Trouble concentrating 0 - - - -  Moving slowly or fidgety/restless 0 - - - -  Suicidal thoughts 0 - - - -  PHQ-9 Score 0 - - - -  Difficult doing work/chores Not difficult at all - - - -   Interpretation of Total Score  Total Score Depression Severity:  1-4 = Minimal depression, 5-9 = Mild depression, 10-14 = Moderate depression, 15-19 = Moderately severe depression, 20-27 = Severe depression   Psychosocial Evaluation and Intervention:  Psychosocial Evaluation - 03/12/24 1512       Psychosocial Evaluation & Interventions   Interventions Encouraged to exercise with the program and follow exercise prescription    Comments Denina denies any psychosocial barriers or concerns at this time.    Expected Outcomes For Aunya to participate in PR free of any psychosocial barriers or concerns    Continue Psychosocial Services  No Follow up required          Psychosocial Re-Evaluation:  Psychosocial Re-Evaluation     Row Name 03/16/24 1544 04/11/24 1628           Psychosocial Re-Evaluation   Current issues with None Identified None Identified      Comments Monthly psychosocial re-evaluation as follows:  Danette has not started the program yet. She is scheduled to start next week. Monthly psychosocial re-evaluation as follows: Marissah denies any needs or resources. She states she has great support from family and friends.      Expected Outcomes For Dinisha to participate in PR free of any psychosocial barriers or concerns For Desi to participate in PR free of any psychosocial barriers or  concerns      Interventions Encouraged to attend Pulmonary Rehabilitation for the exercise Encouraged to attend Pulmonary Rehabilitation for the exercise      Continue Psychosocial Services  No Follow up required No Follow up required         Psychosocial Discharge (Final Psychosocial Re-Evaluation):  Psychosocial Re-Evaluation - 04/11/24 1628       Psychosocial Re-Evaluation   Current issues with None Identified    Comments Monthly psychosocial re-evaluation as follows: Rhylan denies any needs or resources. She states she has great support from family and friends.    Expected Outcomes For Iracema to participate in PR free of any psychosocial barriers or concerns    Interventions Encouraged to attend Pulmonary Rehabilitation for the exercise    Continue Psychosocial Services  No Follow up required          Education: Education Goals: Education classes will be provided on a weekly basis, covering required topics. Participant will state understanding/return demonstration of topics presented.  Learning Barriers/Preferences:  Learning Barriers/Preferences - 03/12/24 1406       Learning Barriers/Preferences   Learning Barriers Hearing   hearing loss   Learning Preferences None          General Pulmonary Education Topics:  Infection Prevention: - Provides verbal and written material to individual with discussion of infection control including proper hand washing and proper equipment cleaning during exercise session.   Falls Prevention: - Provides verbal and written material to individual with discussion of falls prevention and safety.   Chronic Lung Disease Review: - Group verbal instruction with posters, models, PowerPoint presentations and videos,  to review new updates, new respiratory medications, new advancements in procedures and treatments. Providing information on websites and 800 numbers for continued self-education. Includes information about supplement oxygen,  available portable oxygen systems, continuous and intermittent flow rates, oxygen safety, concentrators, and Medicare reimbursement for oxygen. Explanation of Pulmonary Drugs, including class, frequency, complications, importance of spacers, rinsing mouth after steroid MDI's, and proper cleaning methods for nebulizers. Review of basic lung anatomy and physiology related to function, structure, and complications of lung disease. Review of risk factors. Discussion about methods for diagnosing sleep apnea and types of masks and machines for OSA. Includes a review of the use of types of environmental controls: home humidity, furnaces, filters, dust mite/pet prevention, HEPA vacuums. Discussion about weather changes, air quality and the benefits of nasal washing. Instruction on Warning signs, infection symptoms, calling MD promptly, preventive modes, and value of vaccinations. Review of effective airway clearance, coughing and/or vibration techniques. Emphasizing that all should Create an Action Plan. Written material provided at class time.   AED/CPR: - Group verbal and written instruction with the use of models to demonstrate the basic use of the AED with the basic ABC's of resuscitation.    Tests and Procedures:  - Group verbal and visual presentation and models provide information about basic cardiac anatomy and function. Reviews the testing methods done to diagnose heart disease and the outcomes of the test results. Describes the treatment choices: Medical Management, Angioplasty, or Coronary Bypass Surgery for  treating various heart conditions including Myocardial Infarction, Angina, Valve Disease, and Cardiac Arrhythmias.  Written material provided at class time.   Medication Safety: - Group verbal and visual instruction to review commonly prescribed medications for heart and lung disease. Reviews the medication, class of the drug, and side effects. Includes the steps to properly store meds and  maintain the prescription regimen.  Written material given at graduation.   Other: -Provides group and verbal instruction on various topics (see comments)   Knowledge Questionnaire Score:  Knowledge Questionnaire Score - 03/12/24 1513       Knowledge Questionnaire Score   Pre Score 17/18           Core Components/Risk Factors/Patient Goals at Admission:  Personal Goals and Risk Factors at Admission - 03/12/24 1407       Core Components/Risk Factors/Patient Goals on Admission    Weight Management Weight Loss;Yes    Intervention Weight Management: Develop a combined nutrition and exercise program designed to reach desired caloric intake, while maintaining appropriate intake of nutrient and fiber, sodium and fats, and appropriate energy expenditure required for the weight goal.;Weight Management: Provide education and appropriate resources to help participant work on and attain dietary goals.;Weight Management/Obesity: Establish reasonable short term and long term weight goals.;Obesity: Provide education and appropriate resources to help participant work on and attain dietary goals.    Admit Weight 143 lb 15.4 oz (65.3 kg)    Expected Outcomes Short Term: Continue to assess and modify interventions until short term weight is achieved;Long Term: Adherence to nutrition and physical activity/exercise program aimed toward attainment of established weight goal;Weight Loss: Understanding of general recommendations for a balanced deficit meal plan, which promotes 1-2 lb weight loss per week and includes a negative energy balance of [phone] kcal/d;Understanding recommendations for meals to include 15-35% energy as protein, 25-35% energy from fat, 35-60% energy from carbohydrates, less than 200mg  of dietary cholesterol, 20-35 gm of total fiber daily;Understanding of distribution of calorie intake throughout the day with the consumption of 4-5 meals/snacks    Improve shortness of breath with ADL's Yes     Intervention Provide education, individualized exercise plan and daily activity instruction to help decrease symptoms of SOB with activities of daily living.    Expected Outcomes Short Term: Improve cardiorespiratory fitness to achieve a reduction of symptoms when performing ADLs;Long Term: Be able to perform more ADLs without symptoms or delay the onset of symptoms          Education:Diabetes - Individual verbal and written instruction to review signs/symptoms of diabetes, desired ranges of glucose level fasting, after meals and with exercise. Acknowledge that pre and post exercise glucose checks will be done for 3 sessions at entry of program.   Know Your Numbers and Heart Failure: - Group verbal and visual instruction to discuss disease risk factors for cardiac and pulmonary disease and treatment options.  Reviews associated critical values for Overweight/Obesity, Hypertension, Cholesterol, and Diabetes.  Discusses basics of heart failure: signs/symptoms and treatments.  Introduces Heart Failure Zone chart for action plan for heart failure. Written material provided at class time.   Core Components/Risk Factors/Patient Goals Review:   Goals and Risk Factor Review     Row Name 03/16/24 1545 04/11/24 1629           Core Components/Risk Factors/Patient Goals Review   Personal Goals Review Weight Management/Obesity;Improve shortness of breath with ADL's;Develop more efficient breathing techniques such as purse lipped breathing and diaphragmatic breathing and practicing self-pacing with activity.  Weight Management/Obesity;Improve shortness of breath with ADL's;Develop more efficient breathing techniques such as purse lipped breathing and diaphragmatic breathing and practicing self-pacing with activity.      Review Monthly Core Components/Risk Factors/Patient Goals Review as follows: Unable to assess. Bettis is scheduled to start the program next week. Monthly review of patient's Core  Components/Risk Factors/Patient Goals are as follows: Goal in progress for improving her shortness of breath with ADLs and developing more efficient breathing techniques such as purse lipped breathing and diaphragmatic breathing; and practicing self-pacing with activity. Staff is working with her on her breathing techniques. Her oxygen needs have been WNL on room air. She is trying hard to build up her endurance and stamina to complete activities of daily living at home with decreased shortness of breath. Goal progressing on weight loss. Sherika wants to lose weight but hasn't made any changes yet. Her weight has maintained since starting the program. Freyja will continue to benefit from PR for nutrition, education, exercise, and lifestyle modification.      Expected Outcomes Pt will show progress toward meeting expected goals and outcomes. Pt will show progress toward meeting expected goals and outcomes.         Core Components/Risk Factors/Patient Goals at Discharge (Final Review):   Goals and Risk Factor Review - 04/11/24 1629       Core Components/Risk Factors/Patient Goals Review   Personal Goals Review Weight Management/Obesity;Improve shortness of breath with ADL's;Develop more efficient breathing techniques such as purse lipped breathing and diaphragmatic breathing and practicing self-pacing with activity.    Review Monthly review of patient's Core Components/Risk Factors/Patient Goals are as follows: Goal in progress for improving her shortness of breath with ADLs and developing more efficient breathing techniques such as purse lipped breathing and diaphragmatic breathing; and practicing self-pacing with activity. Staff is working with her on her breathing techniques. Her oxygen needs have been WNL on room air. She is trying hard to build up her endurance and stamina to complete activities of daily living at home with decreased shortness of breath. Goal progressing on weight loss. Ermagene wants to  lose weight but hasn't made any changes yet. Her weight has maintained since starting the program. Tere will continue to benefit from PR for nutrition, education, exercise, and lifestyle modification.    Expected Outcomes Pt will show progress toward meeting expected goals and outcomes.          ITP Comments: Pt is making expected progress toward Pulmonary Rehab goals after completing 6 session(s). Recommend continued exercise, life style modification, education, and utilization of breathing techniques to increase stamina and strength, while also decreasing shortness of breath with exertion.   Comments: Dr. Slater Staff is Medical Director for Pulmonary Rehab at [facility].

## 2024-04-19 ENCOUNTER — Encounter (HOSPITAL_COMMUNITY)

## 2024-04-23 ENCOUNTER — Telehealth: Payer: Self-pay | Admitting: Cardiology

## 2024-04-23 DIAGNOSIS — I10 Essential (primary) hypertension: Secondary | ICD-10-CM | POA: Diagnosis not present

## 2024-04-23 NOTE — Telephone Encounter (Signed)
 Paper Work Dropped Off:  B/P readings  Date:04-23-24  Location of paper:   Dr Vertie mailbox

## 2024-04-24 ENCOUNTER — Ambulatory Visit: Payer: Self-pay | Admitting: Cardiology

## 2024-04-24 ENCOUNTER — Encounter (HOSPITAL_COMMUNITY)
Admission: RE | Admit: 2024-04-24 | Discharge: 2024-04-24 | Disposition: A | Discharge status: Home / Self Care | Source: Ambulatory Visit | Attending: Pulmonary Disease

## 2024-04-24 DIAGNOSIS — J432 Centrilobular emphysema: Secondary | ICD-10-CM

## 2024-04-24 DIAGNOSIS — C3492 Malignant neoplasm of unspecified part of left bronchus or lung: Secondary | ICD-10-CM | POA: Diagnosis not present

## 2024-04-24 LAB — BASIC METABOLIC PANEL WITH GFR
BUN/Creatinine Ratio: 16 (ref 12–28)
BUN: 11 mg/dL (ref 8–27)
CO2: 22 mmol/L (ref 20–29)
Calcium: 9.4 mg/dL (ref 8.7–10.3)
Chloride: 97 mmol/L (ref 96–106)
Creatinine, Ser: 0.68 mg/dL (ref 0.57–1.00)
Glucose: 91 mg/dL (ref 70–99)
Potassium: 4.4 mmol/L (ref 3.5–5.2)
Sodium: 135 mmol/L (ref 134–144)
eGFR: 85 mL/min/1.73 (ref >59)

## 2024-04-24 NOTE — Progress Notes (Signed)
 Daily Session Note  Patient Details  Name: Marissa Jacobs MRN: [id number] Date of Birth: 1936-07-05 Referring Provider:   Conrad Ports Pulmonary Rehab Walk Test from 03/12/2024 in [facility], [facility]  Referring Provider Dr. Alaine    Encounter Date: 04/24/2024  Check In:  Session Check In - 04/24/24 1415       Check-In   Supervising physician immediately available to respond to emergencies [facility] MD immediately available    Physician(s) Josefa Beauvais, NP    Location [facility]    Staff Present Ronal Levin, RN, BSN;Casey Claudene, Neita Moats, MS, ACSM-CEP, Exercise Physiologist;Radley Teston Midge BS, ACSM-CEP, Exercise Physiologist    Virtual Visit No    Medication changes reported     No    Fall or balance concerns reported    No    Tobacco Cessation No Change    Warm-up and Cool-down Performed as group-led instruction    Resistance Training Performed Yes    VAD Patient? No    PAD/SET Patient? No      Pain Assessment   Currently in Pain? No/denies    Multiple Pain Sites No          Capillary Blood Glucose: No results found for this or any previous visit (from the past 24 hours).    Social History   Tobacco Use  Smoking Status Former   Current packs/day: 0.00   Types: Cigarettes   Quit date: 02/21/1963   Years since quitting: 61.2  Smokeless Tobacco Never  Tobacco Comments   in college    Goals Met:  Exercise tolerated well No report of concerns or symptoms today Strength training completed today  Goals Unmet:  Not Applicable  Comments:  Service time is from 1314 to 1447.    Dr. Slater Staff is Medical Director for Pulmonary Rehab at [facility].

## 2024-04-26 ENCOUNTER — Encounter (HOSPITAL_COMMUNITY)
Admission: RE | Admit: 2024-04-26 | Discharge: 2024-04-26 | Disposition: A | Discharge status: Home / Self Care | Source: Ambulatory Visit | Attending: Pulmonary Disease

## 2024-04-26 DIAGNOSIS — J432 Centrilobular emphysema: Secondary | ICD-10-CM

## 2024-04-26 NOTE — Progress Notes (Signed)
 Daily Session Note  Patient Details  Name: CELINDA DETHLEFS MRN: [id number] Date of Birth: 02-02-1936 Referring Provider:   Conrad Ports Pulmonary Rehab Walk Test from 03/12/2024 in [facility] for Heart, Vascular, & Lung Health  Referring Provider Dr. Alaine    Encounter Date: 04/26/2024  Check In:  Session Check In - 04/26/24 1335       Check-In   Supervising physician immediately available to respond to emergencies [facility] MD immediately available    Physician(s) Orren Fabry, PA    [facility]    Staff Present Ronal Levin, RN, BSN;Casey Claudene, Neita Moats, MS, ACSM-CEP, Exercise Physiologist;Nohelia Valenza Midge BS, ACSM-CEP, Exercise Physiologist    Virtual Visit No    Medication changes reported     No    Fall or balance concerns reported    No    Tobacco Cessation No Change    Warm-up and Cool-down Performed as group-led instruction    Resistance Training Performed Yes    VAD Patient? No    PAD/SET Patient? No      Pain Assessment   Currently in Pain? No/denies    Multiple Pain Sites No          Capillary Blood Glucose: No results found for this or any previous visit (from the past 24 hours).    Social History   Tobacco Use  Smoking Status Former   Current packs/day: 0.00   Types: Cigarettes   Quit date: 02/21/1963   Years since quitting: 61.2  Smokeless Tobacco Never  Tobacco Comments   in college    Goals Met:  Independence with exercise equipment Exercise tolerated well No report of concerns or symptoms today Strength training completed today  Goals Unmet:  Not Applicable  Comments: Service time is from 13027 to 1441.    Dr. Slater Staff is Medical Director for Pulmonary Rehab at [facility].

## 2024-04-27 ENCOUNTER — Encounter: Payer: Self-pay | Admitting: *Deleted

## 2024-04-27 ENCOUNTER — Other Ambulatory Visit: Payer: Self-pay | Admitting: *Deleted

## 2024-04-27 MED ORDER — AMLODIPINE BESYLATE 5 MG PO TABS: 5.0000 mg | ORAL_TABLET | Freq: Every day | ORAL | 3 refills | Status: AC

## 2024-04-27 NOTE — Telephone Encounter (Signed)
 See mychart messages

## 2024-05-01 ENCOUNTER — Encounter (HOSPITAL_COMMUNITY)
Admission: RE | Admit: 2024-05-01 | Discharge: 2024-05-01 | Disposition: A | Discharge status: Home / Self Care | Source: Ambulatory Visit | Attending: Pulmonary Disease | Admitting: Pulmonary Disease

## 2024-05-01 VITALS — Wt 143.3 lb

## 2024-05-01 DIAGNOSIS — C3492 Malignant neoplasm of unspecified part of left bronchus or lung: Secondary | ICD-10-CM

## 2024-05-01 DIAGNOSIS — J432 Centrilobular emphysema: Secondary | ICD-10-CM | POA: Diagnosis not present

## 2024-05-01 NOTE — Progress Notes (Signed)
 Daily Session Note  Patient Details  Name: Marissa Jacobs MRN: [id number] Date of Birth: [birth year] Referring Provider:   Conrad Ports Pulmonary Rehab Walk Test from 03/12/2024 in [facility] for Heart, Vascular, & Lung Health  Referring Provider Dr. Alaine    Encounter Date: 05/01/2024  Check In:  Session Check In - 05/01/24 1332       Check-In   Supervising physician immediately available to respond to emergencies [facility] MD immediately available    Physician(s) Rosabel Mose, NP    [facility]    Staff Present Ronal Levin, RN, BSN;Casey Claudene, Neita Moats, MS, ACSM-CEP, Exercise Physiologist;Finlee Concepcion Midge BS, ACSM-CEP, Exercise Physiologist;Johnny Fayette, MS, Exercise Physiologist    Virtual Visit No    Medication changes reported     No    Fall or balance concerns reported    No    Tobacco Cessation No Change    Warm-up and Cool-down Performed as group-led instruction    Resistance Training Performed Yes    VAD Patient? No    PAD/SET Patient? No      Pain Assessment   Currently in Pain? No/denies    Multiple Pain Sites No          Capillary Blood Glucose: No results found for this or any previous visit (from the past 24 hours).   Exercise Prescription Changes - 05/01/24 1500       Response to Exercise   Blood Pressure (Admit) 130/60    Blood Pressure (Exercise) 140/80    Blood Pressure (Exit) 128/62    Heart Rate (Admit) 91 bpm    Heart Rate (Exercise) 101 bpm    Heart Rate (Exit) 77 bpm    Oxygen Saturation (Admit) 96 %    Oxygen Saturation (Exercise) 97 %    Oxygen Saturation (Exit) 96 %    Rating of Perceived Exertion (Exercise) 12    Perceived Dyspnea (Exercise) 1    Duration Continue with 30 min of aerobic exercise without signs/symptoms of physical distress.    Intensity THRR unchanged      Resistance Training   Training Prescription Yes    Weight blue bands    Reps 10-15    Time 10 Minutes       Recumbant Bike   Level 2    RPM 52    Watts 16    Minutes 15    METs 2      NuStep   Level 4    SPM 78    Minutes 15    METs 2.8          Social History   Tobacco Use  Smoking Status Former   Current packs/day: 0.00   Types: Cigarettes   Quit date: 02/21/1963   Years since quitting: 61.2  Smokeless Tobacco Never  Tobacco Comments   in college    Goals Met:  Independence with exercise equipment Exercise tolerated well No report of concerns or symptoms today Strength training completed today  Goals Unmet:  Not Applicable  Comments: Service time is from 1322 to 1440.    Dr. Slater Staff is Medical Director for Pulmonary Rehab at [facility].

## 2024-05-03 ENCOUNTER — Encounter (HOSPITAL_COMMUNITY)
Admission: RE | Admit: 2024-05-03 | Discharge: 2024-05-03 | Disposition: A | Discharge status: Home / Self Care | Source: Ambulatory Visit | Attending: Pulmonary Disease | Admitting: Pulmonary Disease

## 2024-05-03 ENCOUNTER — Telehealth (HOSPITAL_COMMUNITY): Payer: Self-pay | Admitting: *Deleted

## 2024-05-03 DIAGNOSIS — J432 Centrilobular emphysema: Secondary | ICD-10-CM | POA: Diagnosis not present

## 2024-05-03 NOTE — Progress Notes (Signed)
 Daily Session Note  Patient Details  Name: Marissa Jacobs MRN: [id number] Date of Birth: Nov 29, 1935 Referring Provider:   Conrad Ports Pulmonary Rehab Walk Test from 03/12/2024 in [facility] for Heart, Vascular, & Lung Health  Referring Provider Dr. Alaine    Encounter Date: 05/03/2024  Check In:  Session Check In - 05/03/24 1336       Check-In   Supervising physician immediately available to respond to emergencies [facility] MD immediately available    Physician(s) Damien Braver, NP    [facility]    Staff Present Ronal Levin, RN, BSN;Casey Claudene, Neita Moats, MS, ACSM-CEP, Exercise Physiologist;Randi Midge HECKLE, ACSM-CEP, Exercise Physiologist    Virtual Visit No    Medication changes reported     No    Fall or balance concerns reported    No    Tobacco Cessation No Change    Warm-up and Cool-down Performed as group-led instruction    Resistance Training Performed Yes    VAD Patient? No    PAD/SET Patient? No      Pain Assessment   Currently in Pain? No/denies    Multiple Pain Sites No          Capillary Blood Glucose: No results found for this or any previous visit (from the past 24 hours).    Social History   Tobacco Use  Smoking Status Former   Current packs/day: 0.00   Types: Cigarettes   Quit date: 02/21/1963   Years since quitting: 61.2  Smokeless Tobacco Never  Tobacco Comments   in college    Goals Met:  Proper associated with RPD/PD & O2 Sat Exercise tolerated well No report of concerns or symptoms today Strength training completed today  Goals Unmet:  Not Applicable  Comments: Service time is from 1316 to 1458.    Dr. Slater Staff is Medical Director for Pulmonary Rehab at [facility].

## 2024-05-03 NOTE — Telephone Encounter (Signed)
 LVM stating she will be at her PR appt today (was previously cancelled).  Aliene Aris BS, ACSM-CEP 05/03/2024 7:52 AM

## 2024-05-08 ENCOUNTER — Encounter (HOSPITAL_COMMUNITY)
Admission: RE | Admit: 2024-05-08 | Discharge: 2024-05-08 | Disposition: A | Discharge status: Home / Self Care | Source: Ambulatory Visit | Attending: Pulmonary Disease | Admitting: Pulmonary Disease

## 2024-05-08 DIAGNOSIS — J432 Centrilobular emphysema: Secondary | ICD-10-CM

## 2024-05-08 NOTE — Progress Notes (Signed)
 Daily Session Note  Patient Details  Name: Marissa Jacobs MRN: [id number] Date of Birth: 08-09-1936 Referring Provider:   Conrad Ports Pulmonary Rehab Walk Test from 03/12/2024 in [facility]  Referring Provider Dr. Alaine    Encounter Date: 05/08/2024  Check In:  Session Check In - 05/08/24 1359       Check-In   Supervising physician immediately available to respond to emergencies [facility] MD immediately available    Physician(s) Damien Braver, NP    Location [facility]    Staff Present Ronal Levin, RN, BSN;Casey Claudene, Neita Moats, MS, ACSM-CEP, Exercise Physiologist;Keaton Stirewalt Midge HECKLE, ACSM-CEP, Exercise Physiologist    Virtual Visit No    Medication changes reported     No    Fall or balance concerns reported    No    Tobacco Cessation No Change    Warm-up and Cool-down Performed as group-led instruction    Resistance Training Performed Yes    VAD Patient? No    PAD/SET Patient? No      Pain Assessment   Currently in Pain? No/denies    Multiple Pain Sites No          Capillary Blood Glucose: No results found for this or any previous visit (from the past 24 hours).    Social History   Tobacco Use  Smoking Status Former   Current packs/day: 0.00   Types: Cigarettes   Quit date: 02/21/1963   Years since quitting: 61.2  Smokeless Tobacco Never  Tobacco Comments   in college    Goals Met:  Exercise tolerated well No report of concerns or symptoms today Strength training completed today  Goals Unmet:  Not Applicable  Comments: Service time is from 1318 to 1438.    Dr. Slater Staff is Medical Director for Pulmonary Rehab at [facility].

## 2024-05-10 ENCOUNTER — Encounter (HOSPITAL_COMMUNITY): Admission: RE | Admit: 2024-05-10 | Source: Ambulatory Visit

## 2024-05-10 ENCOUNTER — Telehealth (HOSPITAL_COMMUNITY): Payer: Self-pay

## 2024-05-10 DIAGNOSIS — R911 Solitary pulmonary nodule: Secondary | ICD-10-CM | POA: Diagnosis not present

## 2024-05-10 DIAGNOSIS — C3492 Malignant neoplasm of unspecified part of left bronchus or lung: Secondary | ICD-10-CM | POA: Diagnosis not present

## 2024-05-10 NOTE — Telephone Encounter (Signed)
 Patient c/o for 1:15 PR class, states she has a dr appt.

## 2024-05-15 ENCOUNTER — Encounter (HOSPITAL_COMMUNITY)
Admission: RE | Admit: 2024-05-15 | Discharge: 2024-05-15 | Disposition: A | Source: Ambulatory Visit | Attending: Pulmonary Disease

## 2024-05-15 VITALS — Wt 142.4 lb

## 2024-05-15 DIAGNOSIS — J432 Centrilobular emphysema: Secondary | ICD-10-CM

## 2024-05-15 NOTE — Progress Notes (Signed)
 Daily Session Note  Patient Details  Name: Marissa Jacobs MRN: [id number] Date of Birth: 26-Dec-1935 Referring Provider:   Conrad Ports Pulmonary Rehab Walk Test from 03/12/2024 in [facility]  Referring Provider Dr. Alaine    Encounter Date: 05/15/2024  Check In:  Session Check In - 05/15/24 1422       Check-In   Supervising physician immediately available to respond to emergencies [facility] MD immediately available    Physician(s) Rosaline Skains, NP    Location [facility]    Staff Present Ronal Levin, RN, BSN;Amarys Sliwinski Claudene Neita Moats, MS, ACSM-CEP, Exercise Physiologist    Virtual Visit No    Medication changes reported     No    Fall or balance concerns reported    No    Tobacco Cessation No Change    Warm-up and Cool-down Performed as group-led instruction    Resistance Training Performed Yes    VAD Patient? No    PAD/SET Patient? No      Pain Assessment   Currently in Pain? No/denies    Multiple Pain Sites No          Capillary Blood Glucose: No results found for this or any previous visit (from the past 24 hours).   Exercise Prescription Changes - 05/15/24 1500       Response to Exercise   Blood Pressure (Admit) 144/72    Blood Pressure (Exercise) 140/70    Blood Pressure (Exit) 132/68    Heart Rate (Admit) 77 bpm    Heart Rate (Exercise) 90 bpm    Heart Rate (Exit) 69 bpm    Oxygen Saturation (Admit) 97 %    Oxygen Saturation (Exercise) 97 %    Oxygen Saturation (Exit) 95 %    Rating of Perceived Exertion (Exercise) 12    Perceived Dyspnea (Exercise) 1    Duration Continue with 30 min of aerobic exercise without signs/symptoms of physical distress.    Intensity THRR unchanged      Progression   Progression Continue to progress workloads to maintain intensity without signs/symptoms of physical distress.      Resistance Training   Training Prescription Yes    Weight blue  bands    Reps 10-15    Time 10 Minutes      Recumbant Bike   Level 3    RPM 52    Watts 23    Minutes 15    METs 2.6      NuStep   Level 4    SPM 73    Minutes 15    METs 2.8          Social History   Tobacco Use  Smoking Status Former   Current packs/day: 0.00   Types: Cigarettes   Quit date: 02/21/1963   Years since quitting: 61.2  Smokeless Tobacco Never  Tobacco Comments   in college    Goals Met:  Proper associated with RPD/PD & O2 Sat Independence with exercise equipment Exercise tolerated well No report of concerns or symptoms today Strength training completed today  Goals Unmet:  Not Applicable  Comments: Service time is from 1320 to 1432.    Dr. Slater Staff is Medical Director for Pulmonary Rehab at [facility]

## 2024-05-16 NOTE — Progress Notes (Signed)
 Pulmonary Individual Treatment Plan  Patient Details  Name: Marissa Jacobs MRN: [id number] Date of Birth: 08/09/1936 Referring Provider:   Conrad Ports Pulmonary Rehab Walk Test from 03/12/2024 in [facility] for Heart, V[facility], & Lung Health  Referring Provider Dr. Alaine    Initial Encounter Date:  Flowsheet Row Pulmonary Rehab Walk Test from 03/12/2024 in [facility] for Heart, Vascular, & Lung Health  Date 03/12/24    Visit Diagnosis: Centrilobular emphysema (HCC)  Patient's Home Medications on Admission:   Current Outpatient Medications:    amLODipine  (NORVASC ) 5 MG tablet, Take 1 tablet (5 mg total) by mouth daily., Disp: 90 tablet, Rfl: 3   Ascorbic Acid (VITAMIN C) 500 MG CAPS, Take 1 Capful by mouth daily., Disp: , Rfl:    Cholecalciferol 125 MCG (5000 UT) TABS, Take 1 tablet by mouth daily., Disp: , Rfl:    Cobalamin Combinations (B12 FOLATE) 800-800 MCG CAPS, 1 tablet Orally Once a day, Disp: , Rfl:    Coenzyme Q10 (CO Q 10 PO), Take 1 tablet by mouth daily., Disp: , Rfl:    EPIPEN 2-PAK 0.3 MG/0.3ML SOAJ injection, Inject 0.3 mg into the muscle as needed for anaphylaxis [employer])., Disp: , Rfl:    estradiol  (VIVELLE -DOT) 0.025 MG/24HR, Place 1 patch onto the skin 2 (two) times a week., Disp: 24 patch, Rfl: 3   fexofenadine (ALLEGRA) 180 MG tablet, Take 180 mg by mouth daily as needed for allergies., Disp: , Rfl:    Grape Seed Extract 100 MG CAPS, Take 1 capsule by mouth daily., Disp: , Rfl:    hydrochlorothiazide  (MICROZIDE ) 12.5 MG capsule, Take 12.5 mg by mouth daily., Disp: , Rfl:    losartan  (COZAAR ) 100 MG tablet, Take 1 tablet (100 mg total) by mouth daily., Disp: 90 tablet, Rfl: 3   MAGNESIUM GLYCINATE PO, Take 1 tablet by mouth daily., Disp: , Rfl:    Multiple Vitamin (MULTIVITAMIN) capsule, Take 1 capsule by mouth daily., Disp: , Rfl:    nitroGLYCERIN  (NITROSTAT ) 0.4 MG SL tablet, Place 1 tablet (0.4 mg total)  under the tongue every 5 (five) minutes as needed for chest pain. (Patient not taking: Reported on 03/12/2024), Disp: 30 tablet, Rfl: 0   Omega-3 1000 MG CAPS, Take 1 capsule by mouth daily at 6 (six) AM., Disp: , Rfl:    Omega-3 Fatty Acids (OMEGA-3 FISH OIL PO), Take 1,600 mg by mouth daily., Disp: , Rfl:    omeprazole (PRILOSEC) 20 MG capsule, Take 20 mg by mouth every other day., Disp: , Rfl:    progesterone  (PROMETRIUM ) 100 MG capsule, Take 100 mg by mouth daily., Disp: , Rfl:    pyridoxine (B-6) 100 MG tablet, Take 100 mg by mouth daily., Disp: , Rfl:    tretinoin (RETIN-A) 0.025 % cream, Apply 1 application topically at bedtime. , Disp: , Rfl:    vitamin k 100 MCG tablet, Take 100 mcg by mouth daily. Take after taking vitamin D3., Disp: , Rfl:    Zinc 50 MG TABS, Take 1 tablet by mouth daily., Disp: , Rfl:   Past Medical History: Past Medical History:  Diagnosis Date   Anemia    secondary to taking omeprazole-on iron   Cancer (HCC) 08/2020   lung   Diverticulosis 09/2002   Dysrhythmia    a-fib after surgery 09/04/20. treated with Metoprolol  for 6 week   GERD (gastroesophageal reflux disease)    Hyperlipidemia    Hypertension    Lung nodule  Osteopenia    Post traumatic stress disorder    STD (sexually transmitted disease)    HSV    Tobacco Use: Social History   Tobacco Use  Smoking Status Former   Current packs/day: 0.00   Types: Cigarettes   Quit date: 02/21/1963   Years since quitting: 61.2  Smokeless Tobacco Never  Tobacco Comments   in college    Labs: Review Flowsheet        No data to display          Capillary Blood Glucose: No results found for: GLUCAP   Pulmonary Assessment Scores:  Pulmonary Assessment Scores     Row Name 03/12/24 1513         ADL UCSD   ADL Phase Entry     SOB Score total 2       CAT Score   CAT Score 2       mMRC Score   mMRC Score 1       UCSD: Self-administered rating of dyspnea associated with  activities of daily living (ADLs) 6-point scale (0 = not at all to 5 = maximal or unable to do because of breathlessness)  Scoring Scores range from 0 to 120.  Minimally important difference is 5 units  CAT: CAT can identify the health impairment of COPD patients and is better correlated with disease progression.  CAT has a scoring range of zero to 40. The CAT score is classified into four groups of low (less than 10), medium (10 - 20), high (21-30) and very high (31-40) based on the impact level of disease on health status. A CAT score over 10 suggests significant symptoms.  A worsening CAT score could be explained by an exacerbation, poor medication adherence, poor inhaler technique, or progression of COPD or comorbid conditions.  CAT MCID is 2 points  mMRC: mMRC (Modified Medical Research Council) Dyspnea Scale is used to assess the degree of baseline functional disability in patients of respiratory disease due to dyspnea. No minimal important difference is established. A decrease in score of 1 point or greater is considered a positive change.   Pulmonary Function Assessment:  Pulmonary Function Assessment - 03/12/24 1422       Breath   Bilateral Breath Sounds Clear    Shortness of Breath No          Exercise Target Goals: Exercise Program Goal: Individual exercise prescription set using results from initial 6 min walk test and THRR while considering  patient's activity barriers and safety.   Exercise Prescription Goal: Initial exercise prescription builds to 30-45 minutes a day of aerobic activity, 2-3 days per week.  Home exercise guidelines will be given to patient during program as part of exercise prescription that the participant will acknowledge.  Activity Barriers & Risk Stratification:  Activity Barriers & Cardiac Risk Stratification - 03/12/24 1414       Activity Barriers & Cardiac Risk Stratification   Activity Barriers Deconditioning;Muscular Weakness;Shortness  of Breath;Left Knee Replacement    Cardiac Risk Stratification Moderate          6 Minute Walk:  6 Minute Walk     Row Name 03/12/24 1518         6 Minute Walk   Phase Initial     Distance 1096 feet     Walk Time 6 minutes     # of Rest Breaks 0     METS 2.08     RPE 11     Perceived  Dyspnea  0     VO2 Peak 7.79     Symptoms No     Resting HR 71 bpm     Resting BP 148/66     Resting Oxygen Saturation  99 %     Exercise Oxygen Saturation  during 6 min walk 96 %     Max Ex. HR 111 bpm     Max Ex. BP 190/60     2 Minute Post BP 160/60       Interval HR   1 Minute HR 90     2 Minute HR 105     3 Minute HR 111     4 Minute HR 107     5 Minute HR 109     6 Minute HR 110     2 Minute Post HR 72     Interval Heart Rate? Yes       Interval Oxygen   Interval Oxygen? Yes     Baseline Oxygen Saturation % 99 %     1 Minute Oxygen Saturation % 97 %     1 Minute Liters of Oxygen 0 L     2 Minute Oxygen Saturation % 96 %     2 Minute Liters of Oxygen 0 L     3 Minute Oxygen Saturation % 97 %     3 Minute Liters of Oxygen 0 L     4 Minute Oxygen Saturation % 96 %     4 Minute Liters of Oxygen 0 L     5 Minute Oxygen Saturation % 97 %     5 Minute Liters of Oxygen 0 L     6 Minute Oxygen Saturation % 96 %     6 Minute Liters of Oxygen 0 L     2 Minute Post Oxygen Saturation % 98 %     2 Minute Post Liters of Oxygen 0 L        Oxygen Initial Assessment:  Oxygen Initial Assessment - 03/12/24 1420       Home Oxygen   Home Oxygen Device None    Sleep Oxygen Prescription None    Home Exercise Oxygen Prescription None    Home Resting Oxygen Prescription None      Initial 6 min Walk   Oxygen Used None      Program Oxygen Prescription   Program Oxygen Prescription None      Intervention   Short Term Goals To learn and understand importance of monitoring SPO2 with pulse oximeter and demonstrate accurate use of the pulse oximeter.;To learn and understand importance  of maintaining oxygen saturations>88%;To learn and demonstrate proper pursed lip breathing techniques or other breathing techniques. ;To learn and demonstrate proper use of respiratory medications    Long  Term Goals Maintenance of O2 saturations>88%;Compliance with respiratory medication;Verbalizes importance of monitoring SPO2 with pulse oximeter and return demonstration;Exhibits proper breathing techniques, such as pursed lip breathing or other method taught during program session;Demonstrates proper use of MDI's          Oxygen Re-Evaluation:  Oxygen Re-Evaluation     Row Name 03/20/24 0912 04/17/24 0847 05/08/24 0850         Program Oxygen Prescription   Program Oxygen Prescription None None None       Home Oxygen   Home Oxygen Device None None None     Sleep Oxygen Prescription None None None     Home Exercise Oxygen Prescription None None None  Home Resting Oxygen Prescription None None None       Goals/Expected Outcomes   Short Term Goals To learn and understand importance of monitoring SPO2 with pulse oximeter and demonstrate accurate use of the pulse oximeter.;To learn and understand importance of maintaining oxygen saturations>88%;To learn and demonstrate proper pursed lip breathing techniques or other breathing techniques. ;To learn and demonstrate proper use of respiratory medications To learn and understand importance of monitoring SPO2 with pulse oximeter and demonstrate accurate use of the pulse oximeter.;To learn and understand importance of maintaining oxygen saturations>88%;To learn and demonstrate proper pursed lip breathing techniques or other breathing techniques. ;To learn and demonstrate proper use of respiratory medications To learn and understand importance of monitoring SPO2 with pulse oximeter and demonstrate accurate use of the pulse oximeter.;To learn and understand importance of maintaining oxygen saturations>88%;To learn and demonstrate proper pursed lip  breathing techniques or other breathing techniques. ;To learn and demonstrate proper use of respiratory medications     Long  Term Goals Maintenance of O2 saturations>88%;Compliance with respiratory medication;Verbalizes importance of monitoring SPO2 with pulse oximeter and return demonstration;Exhibits proper breathing techniques, such as pursed lip breathing or other method taught during program session;Demonstrates proper use of MDI's Maintenance of O2 saturations>88%;Compliance with respiratory medication;Verbalizes importance of monitoring SPO2 with pulse oximeter and return demonstration;Exhibits proper breathing techniques, such as pursed lip breathing or other method taught during program session;Demonstrates proper use of MDI's Maintenance of O2 saturations>88%;Compliance with respiratory medication;Verbalizes importance of monitoring SPO2 with pulse oximeter and return demonstration;Exhibits proper breathing techniques, such as pursed lip breathing or other method taught during program session;Demonstrates proper use of MDI's     Goals/Expected Outcomes Compliance and understanding of oxygen saturation monitoring and breathing techniques to decrease shortness of breath. Compliance and understanding of oxygen saturation monitoring and breathing techniques to decrease shortness of breath. Compliance and understanding of oxygen saturation monitoring and breathing techniques to decrease shortness of breath.        Oxygen Discharge (Final Oxygen Re-Evaluation):  Oxygen Re-Evaluation - 05/08/24 0850       Program Oxygen Prescription   Program Oxygen Prescription None      Home Oxygen   Home Oxygen Device None    Sleep Oxygen Prescription None    Home Exercise Oxygen Prescription None    Home Resting Oxygen Prescription None      Goals/Expected Outcomes   Short Term Goals To learn and understand importance of monitoring SPO2 with pulse oximeter and demonstrate accurate use of the pulse  oximeter.;To learn and understand importance of maintaining oxygen saturations>88%;To learn and demonstrate proper pursed lip breathing techniques or other breathing techniques. ;To learn and demonstrate proper use of respiratory medications    Long  Term Goals Maintenance of O2 saturations>88%;Compliance with respiratory medication;Verbalizes importance of monitoring SPO2 with pulse oximeter and return demonstration;Exhibits proper breathing techniques, such as pursed lip breathing or other method taught during program session;Demonstrates proper use of MDI's    Goals/Expected Outcomes Compliance and understanding of oxygen saturation monitoring and breathing techniques to decrease shortness of breath.          Initial Exercise Prescription:  Initial Exercise Prescription - 03/12/24 1500       Date of Initial Exercise RX and Referring Provider   Date 03/12/24    Referring Provider Dr. Alaine    Expected Discharge Date 06/07/24      Recumbant Bike   Level 1    RPM 50    Watts 40  Minutes 15    METs 1.9      NuStep   Level 1    SPM 64    Minutes 15    METs 1.9      Prescription Details   Frequency (times per week) 2    Duration Progress to 30 minutes of continuous aerobic without signs/symptoms of physical distress      Intensity   THRR 40-80% of Max Heartrate 54-107    Ratings of Perceived Exertion 11-13    Perceived Dyspnea 0-4      Progression   Progression Continue to progress workloads to maintain intensity without signs/symptoms of physical distress.      Resistance Training   Training Prescription Yes    Weight red bands    Reps 10-15          Perform Capillary Blood Glucose checks as needed.  Exercise Prescription Changes:   Exercise Prescription Changes     Row Name 04/03/24 1500 04/17/24 1400 05/01/24 1500 05/15/24 1500       Response to Exercise   Blood Pressure (Admit) 140/66 156/70 130/60 144/72    Blood Pressure (Exercise) 168/70 142/76  140/80 140/70    Blood Pressure (Exit) 138/64 122/62 128/62 132/68    Heart Rate (Admit) 72 bpm 82 bpm 91 bpm 77 bpm    Heart Rate (Exercise) 98 bpm 94 bpm 101 bpm 90 bpm    Heart Rate (Exit) 68 bpm 71 bpm 77 bpm 69 bpm    Oxygen Saturation (Admit) 98 % 95 % 96 % 97 %    Oxygen Saturation (Exercise) 97 % 96 % 97 % 97 %    Oxygen Saturation (Exit) 97 % 96 % 96 % 95 %    Rating of Perceived Exertion (Exercise) 12 13 12 12     Perceived Dyspnea (Exercise) 0 3 1 1     Duration Continue with 30 min of aerobic exercise without signs/symptoms of physical distress. Continue with 30 min of aerobic exercise without signs/symptoms of physical distress. Continue with 30 min of aerobic exercise without signs/symptoms of physical distress. Continue with 30 min of aerobic exercise without signs/symptoms of physical distress.    Intensity THRR unchanged THRR unchanged THRR unchanged THRR unchanged      Progression   Progression Continue to progress workloads to maintain intensity without signs/symptoms of physical distress. Continue to progress workloads to maintain intensity without signs/symptoms of physical distress. -- Continue to progress workloads to maintain intensity without signs/symptoms of physical distress.      Resistance Training   Training Prescription Yes Yes Yes Yes    Weight red bands red bands blue bands blue bands    Reps 10-15 10-15 10-15 10-15    Time 10 Minutes 10 Minutes 10 Minutes 10 Minutes      Recumbant Bike   Level 2 3 2 3     RPM 51 48 52 52    Watts 17 13 16 23     Minutes 15 15 15 15     METs 2.2 2.3 2 2.6      NuStep   Level 3 3 4 4     SPM 85 71 78 73    Minutes 15 15 15 15     METs 2.8 2.4 2.8 2.8       Exercise Comments:   Exercise Comments     Row Name 03/22/24 1505           Exercise Comments Pt completed first day of exercise. She exercised for 15  min on the Nustep and recumbent bike. She performed the warmup and cooldown standing without limitations.  Discussed METs with good reception.          Exercise Goals and Review:   Exercise Goals     Row Name 03/12/24 1414             Exercise Goals   Increase Physical Activity Yes       Intervention Provide advice, education, support and counseling about physical activity/exercise needs.;Develop an individualized exercise prescription for aerobic and resistive training based on initial evaluation findings, risk stratification, comorbidities and participant's personal goals.       Expected Outcomes Short Term: Attend rehab on a regular basis to increase amount of physical activity.;Long Term: Add in home exercise to make exercise part of routine and to increase amount of physical activity.;Long Term: Exercising regularly at least 3-5 days a week.       Increase Strength and Stamina Yes       Intervention Provide advice, education, support and counseling about physical activity/exercise needs.;Develop an individualized exercise prescription for aerobic and resistive training based on initial evaluation findings, risk stratification, comorbidities and participant's personal goals.       Expected Outcomes Short Term: Increase workloads from initial exercise prescription for resistance, speed, and METs.;Short Term: Perform resistance training exercises routinely during rehab and add in resistance training at home;Long Term: Improve cardiorespiratory fitness, muscular endurance and strength as measured by increased METs and functional capacity ( )       Able to understand and use rate of perceived exertion (RPE) scale Yes       Intervention Provide education and explanation on how to use RPE scale       Expected Outcomes Short Term: Able to use RPE daily in rehab to express subjective intensity level;Long Term:  Able to use RPE to guide intensity level when exercising independently       Able to understand and use Dyspnea scale Yes       Intervention Provide education and explanation on how to use  Dyspnea scale       Expected Outcomes Short Term: Able to use Dyspnea scale daily in rehab to express subjective sense of shortness of breath during exertion;Long Term: Able to use Dyspnea scale to guide intensity level when exercising independently       Knowledge and understanding of Target Heart Rate Range (THRR) Yes       Intervention Provide education and explanation of THRR including how the numbers were predicted and where they are located for reference       Expected Outcomes Short Term: Able to state/look up THRR;Long Term: Able to use THRR to govern intensity when exercising independently;Short Term: Able to use daily as guideline for intensity in rehab       Understanding of Exercise Prescription Yes       Intervention Provide education, explanation, and written materials on patient's individual exercise prescription       Expected Outcomes Short Term: Able to explain program exercise prescription;Long Term: Able to explain home exercise prescription to exercise independently          Exercise Goals Re-Evaluation :  Exercise Goals Re-Evaluation     Row Name 03/20/24 0911 04/17/24 0844 05/08/24 0846         Exercise Goal Re-Evaluation   Exercise Goals Review Increase Physical Activity;Able to understand and use Dyspnea scale;Understanding of Exercise Prescription;Increase Strength and Stamina;Knowledge and understanding of Target Heart Rate Range (  THRR);Able to understand and use rate of perceived exertion (RPE) scale Increase Physical Activity;Able to understand and use Dyspnea scale;Understanding of Exercise Prescription;Increase Strength and Stamina;Knowledge and understanding of Target Heart Rate Range (THRR);Able to understand and use rate of perceived exertion (RPE) scale Increase Physical Activity;Able to understand and use Dyspnea scale;Understanding of Exercise Prescription;Increase Strength and Stamina;Knowledge and understanding of Target Heart Rate Range (THRR);Able to  understand and use rate of perceived exertion (RPE) scale     Comments Pt is scheduled to begin exercise 8/7. Will progress as tolerated. Pt has completed 5 exercise sessions, missing 2. She is exercising on the recumbent stepper for 15 min, level 3, METs 2.6. She then is exercising on the recumbent bike for 15 min, level 2, METs 2.6. She tried level 3 last session but felt it was too hard. She performs warm up and cool down without significant limitations, using blue bands, 5.8 lbs. Will progress as tolerated. Pt has completed 10 exercise sessions, missing 2. She is exercising on the recumbent stepper for 15 min, level 4, METs 2.6. She then is exercising on the recumbent bike for 15 min, level 2, METs 2.6. Level 3 is too hard for her. She performs warm up and cool down without significant limitations, using blue bands, 5.8 lbs. Will progress as tolerated.     Expected Outcomes Through exercise at rehab and home, the patient will decrease shortness of breath and feel confident in carrying out an exercise regimen at home. Through exercise at rehab and home, the patient will decrease shortness of breath and feel confident in carrying out an exercise regimen at home. Through exercise at rehab and home, the patient will decrease shortness of breath and feel confident in carrying out an exercise regimen at home.        Discharge Exercise Prescription (Final Exercise Prescription Changes):  Exercise Prescription Changes - 05/15/24 1500       Response to Exercise   Blood Pressure (Admit) 144/72    Blood Pressure (Exercise) 140/70    Blood Pressure (Exit) 132/68    Heart Rate (Admit) 77 bpm    Heart Rate (Exercise) 90 bpm    Heart Rate (Exit) 69 bpm    Oxygen Saturation (Admit) 97 %    Oxygen Saturation (Exercise) 97 %    Oxygen Saturation (Exit) 95 %    Rating of Perceived Exertion (Exercise) 12    Perceived Dyspnea (Exercise) 1    Duration Continue with 30 min of aerobic exercise without  signs/symptoms of physical distress.    Intensity THRR unchanged      Progression   Progression Continue to progress workloads to maintain intensity without signs/symptoms of physical distress.      Resistance Training   Training Prescription Yes    Weight blue bands    Reps 10-15    Time 10 Minutes      Recumbant Bike   Level 3    RPM 52    Watts 23    Minutes 15    METs 2.6      NuStep   Level 4    SPM 73    Minutes 15    METs 2.8          Nutrition:  Target Goals: Understanding of nutrition guidelines, daily intake of sodium 1500mg , cholesterol 200mg , calories 30% from fat and 7% or less from saturated fats, daily to have 5 or more servings of fruits and vegetables.  Biometrics:  Pre Biometrics - 03/12/24 1513  Pre Biometrics   Grip Strength 20 kg           Nutrition Therapy Plan and Nutrition Goals:  Nutrition Therapy & Goals - 03/22/24 1439       Nutrition Therapy   Diet General Healthy Diet      Personal Nutrition Goals   Nutrition Goal Patient to improve diet quality by using the plate method as a guide for meal planning to include lean protein/plant protein, fruits, vegetables, whole grains, nonfat dairy as part of a well-balanced diet.    Comments [place] has medical hisory of HTN, hyperlipidemia, lung cancer. She follows a normal diet. Per documentation with cardiology on 08/23/23, she declines statin. BMI remains appropriate for age. Patient will benefit from participation in pulmonary rehab for nutrition education, exercise, and lifestyle modification.      Intervention Plan   Intervention Prescribe, educate and counsel regarding individualized specific dietary modifications aiming towards targeted core components such as weight, hypertension, lipid management, diabetes, heart failure and other comorbidities.;Nutrition handout(s) given to patient.    Expected Outcomes Short Term Goal: Understand basic principles of dietary content, such as  calories, fat, sodium, cholesterol and nutrients.;Long Term Goal: Adherence to prescribed nutrition plan.          Nutrition Assessments:  Nutrition Assessments - 03/29/24 0922       Rate Your Plate Scores   Pre Score 70         MEDIFICTS Score Key: >=70 Need to make dietary changes  40-70 Heart Healthy Diet <= 40 Therapeutic Level Cholesterol Diet  Flowsheet Row PULMONARY REHAB OTHER RESPIRATORY from 03/27/2024 in [facility] for Heart, Vascular, & Lung Health  Picture Your Plate Total Score on Admission 70   Picture Your Plate Scores: <59 Unhealthy dietary pattern with much room for improvement. 41-50 Dietary pattern unlikely to meet recommendations for good health and room for improvement. 51-60 More healthful dietary pattern, with some room for improvement.  >60 Healthy dietary pattern, although there may be some specific behaviors that could be improved.    Nutrition Goals Re-Evaluation:  Nutrition Goals Re-Evaluation     Row Name 03/22/24 1439             Goals   Current Weight 143 lb 15.4 oz (65.3 kg)       Comment no recent lipid panel avaiable for review       Expected Outcome Aireana has medical hisory of HTN, hyperlipidemia, lung cancer. She follows a normal diet. Per documentation with cardiology on 08/23/23, she declines statin. BMI remains appropriate for age. Patient will benefit from participation in pulmonary rehab for nutrition education, exercise, and lifestyle modification.          Nutrition Goals Discharge (Final Nutrition Goals Re-Evaluation):  Nutrition Goals Re-Evaluation - 03/22/24 1439       Goals   Current Weight 143 lb 15.4 oz (65.3 kg)    Comment no recent lipid panel avaiable for review    Expected Outcome Zosia has medical hisory of HTN, hyperlipidemia, lung cancer. She follows a normal diet. Per documentation with cardiology on 08/23/23, she declines statin. BMI remains appropriate for age. Patient will benefit  from participation in pulmonary rehab for nutrition education, exercise, and lifestyle modification.          Psychosocial: Target Goals: Acknowledge presence or absence of significant depression and/or stress, maximize coping skills, provide positive support system. Participant is able to verbalize types and ability to use techniques and skills  needed for reducing stress and depression.  Initial Review & Psychosocial Screening:  Initial Psych Review & Screening - 03/12/24 1405       Initial Review   Current issues with None Identified      Family Dynamics   Good Support System? Yes      Barriers   Psychosocial barriers to participate in program There are no identifiable barriers or psychosocial needs.      Screening Interventions   Interventions Encouraged to exercise          Quality of Life Scores:  Scores of 19 and below usually indicate a poorer quality of life in these areas.  A difference of  2-3 points is a clinically meaningful difference.  A difference of 2-3 points in the total score of the Quality of Life Index has been associated with significant improvement in overall quality of life, self-image, physical symptoms, and general health in studies assessing change in quality of life.  PHQ-9: Review Flowsheet  More data may exist      03/12/2024 07/06/2023 04/22/2023 06/21/2022 07/13/2021  Depression screen PHQ 2/9  Decreased Interest 0 0 0 0 0  Down, Depressed, Hopeless 0 0 0 0 0  PHQ - 2 Score 0 0 0 0 0  Altered sleeping 0 - - - -  Tired, decreased energy 0 - - - -  Change in appetite 0 - - - -  Feeling bad or failure about yourself  0 - - - -  Trouble concentrating 0 - - - -  Moving slowly or fidgety/restless 0 - - - -  Suicidal thoughts 0 - - - -  PHQ-9 Score 0 - - - -  Difficult doing work/chores Not difficult at all - - - -   Interpretation of Total Score  Total Score Depression Severity:  1-4 = Minimal depression, 5-9 = Mild depression, 10-14 =  Moderate depression, 15-19 = Moderately severe depression, 20-27 = Severe depression   Psychosocial Evaluation and Intervention:  Psychosocial Evaluation - 03/12/24 1512       Psychosocial Evaluation & Interventions   Interventions Encouraged to exercise with the program and follow exercise prescription    Comments Murle denies any psychosocial barriers or concerns at this time.    Expected Outcomes For Moriyah to participate in PR free of any psychosocial barriers or concerns    Continue Psychosocial Services  No Follow up required          Psychosocial Re-Evaluation:  Psychosocial Re-Evaluation     Row Name 03/16/24 1544 04/11/24 1628 05/07/24 0942         Psychosocial Re-Evaluation   Current issues with None Identified None Identified None Identified     Comments Monthly psychosocial re-evaluation as follows: Jaleesa has not started the program yet. She is scheduled to start next week. Monthly psychosocial re-evaluation as follows: Britanie denies any needs or resources. She states she has great support from family and friends. Monthly psychosocial re-evaluation as follows: Abisai continues to deny any psy/soc barriers or concerns. She denies and additional resources or referrals. She states she has great support from family and friends.     Expected Outcomes For Dareen to participate in PR free of any psychosocial barriers or concerns For Katheen to participate in PR free of any psychosocial barriers or concerns For Trysten to participate in PR free of any psychosocial barriers or concerns     Interventions Encouraged to attend Pulmonary Rehabilitation for the exercise Encouraged to attend Pulmonary Rehabilitation  for the exercise Encouraged to attend Pulmonary Rehabilitation for the exercise     Continue Psychosocial Services  No Follow up required No Follow up required No Follow up required        Psychosocial Discharge (Final Psychosocial Re-Evaluation):  Psychosocial Re-Evaluation -  05/07/24 0942       Psychosocial Re-Evaluation   Current issues with None Identified    Comments Monthly psychosocial re-evaluation as follows: Laniesha continues to deny any psy/soc barriers or concerns. She denies and additional resources or referrals. She states she has great support from family and friends.    Expected Outcomes For Dea to participate in PR free of any psychosocial barriers or concerns    Interventions Encouraged to attend Pulmonary Rehabilitation for the exercise    Continue Psychosocial Services  No Follow up required          Education: Education Goals: Education classes will be provided on a weekly basis, covering required topics. Participant will state understanding/return demonstration of topics presented.  Learning Barriers/Preferences:  Learning Barriers/Preferences - 03/12/24 1406       Learning Barriers/Preferences   Learning Barriers Hearing   hearing loss   Learning Preferences None          Education Topics: Know Your Numbers Group instruction that is supported by a PowerPoint presentation. Instructor discusses importance of knowing and understanding resting, exercise, and post-exercise oxygen saturation, heart rate, and blood pressure. Oxygen saturation, heart rate, blood pressure, rating of perceived exertion, and dyspnea are reviewed along with a normal range for these values.  Flowsheet Row PULMONARY REHAB OTHER RESPIRATORY from 05/03/2024 in [facility] for Heart, Vascular, & Lung Health  Date 05/03/24  Educator EP  Instruction Review Code 1- Verbalizes Understanding    Exercise for the Pulmonary Patient Group instruction that is supported by a PowerPoint presentation. Instructor discusses benefits of exercise, core components of exercise, frequency, duration, and intensity of an exercise routine, importance of utilizing pulse oximetry during exercise, safety while exercising, and options of places to exercise outside  of rehab.  Flowsheet Row PULMONARY REHAB OTHER RESPIRATORY from 04/26/2024 in [facility] for Heart, Vascular, & Lung Health  Date 04/26/24  Educator EP  Instruction Review Code 1- Verbalizes Understanding    MET Level  Group instruction provided by PowerPoint, verbal discussion, and written material to support subject matter. Instructor reviews what METs are and how to increase METs.    Pulmonary Medications Verbally interactive group education provided by instructor with focus on inhaled medications and proper administration.   Anatomy and Physiology of the Respiratory System Group instruction provided by PowerPoint, verbal discussion, and written material to support subject matter. Instructor reviews respiratory cycle and anatomical components of the respiratory system and their functions. Instructor also reviews differences in obstructive and restrictive respiratory diseases with examples of each.    Oxygen Safety Group instruction provided by PowerPoint, verbal discussion, and written material to support subject matter. There is an overview of "What is Oxygen" and "Why do we need it".  Instructor also reviews how to create a safe environment for oxygen use, the importance of using oxygen as prescribed, and the risks of noncompliance. There is a brief discussion on traveling with oxygen and resources the patient may utilize.   Oxygen Use Group instruction provided by PowerPoint, verbal discussion, and written material to discuss how supplemental oxygen is prescribed and different types of oxygen supply systems. Resources for more information are provided.  Breathing Techniques Group instruction that is supported by demonstration and informational handouts. Instructor discusses the benefits of pursed lip and diaphragmatic breathing and detailed demonstration on how to perform both.     Risk Factor Reduction Group instruction that is supported by a  PowerPoint presentation. Instructor discusses the definition of a risk factor, different risk factors for pulmonary disease, and how the heart and lungs work together. Flowsheet Row PULMONARY REHAB OTHER RESPIRATORY from 03/22/2024 in [facility] for Heart, Vascular, & Lung Health  Date 03/22/24  Educator EP  Instruction Review Code 1- Verbalizes Understanding    Pulmonary Diseases Group instruction provided by PowerPoint, verbal discussion, and written material to support subject matter. Instructor gives an overview of the different type of pulmonary diseases. There is also a discussion on risk factors and symptoms as well as ways to manage the diseases. Flowsheet Row PULMONARY REHAB OTHER RESPIRATORY from 04/05/2024 in [facility] for Heart, Vascular, & Lung Health  Date 04/05/24  Educator RT  Instruction Review Code 1- Verbalizes Understanding    Stress and Energy Conservation Group instruction provided by PowerPoint, verbal discussion, and written material to support subject matter. Instructor gives an overview of stress and the impact it can have on the body. Instructor also reviews ways to reduce stress. There is also a discussion on energy conservation and ways to conserve energy throughout the day.   Warning Signs and Symptoms Group instruction provided by PowerPoint, verbal discussion, and written material to support subject matter. Instructor reviews warning signs and symptoms of stroke, heart attack, cold and flu. Instructor also reviews ways to prevent the spread of infection.   Other Education Group or individual verbal, written, or video instructions that support the educational goals of the pulmonary rehab program.    Knowledge Questionnaire Score:  Knowledge Questionnaire Score - 03/12/24 1513       Knowledge Questionnaire Score   Pre Score 17/18          Core Components/Risk Factors/Patient Goals at Admission:   Personal Goals and Risk Factors at Admission - 03/12/24 1407       Core Components/Risk Factors/Patient Goals on Admission    Weight Management Weight Loss;Yes    Intervention Weight Management: Develop a combined nutrition and exercise program designed to reach desired caloric intake, while maintaining appropriate intake of nutrient and fiber, sodium and fats, and appropriate energy expenditure required for the weight goal.;Weight Management: Provide education and appropriate resources to help participant work on and attain dietary goals.;Weight Management/Obesity: Establish reasonable short term and long term weight goals.;Obesity: Provide education and appropriate resources to help participant work on and attain dietary goals.    Admit Weight 143 lb 15.4 oz (65.3 kg)    Expected Outcomes Short Term: Continue to assess and modify interventions until short term weight is achieved;Long Term: Adherence to nutrition and physical activity/exercise program aimed toward attainment of established weight goal;Weight Loss: Understanding of general recommendations for a balanced deficit meal plan, which promotes 1-2 lb weight loss per week and includes a negative energy balance of [phone] kcal/d;Understanding recommendations for meals to include 15-35% energy as protein, 25-35% energy from fat, 35-60% energy from carbohydrates, less than 200mg  of dietary cholesterol, 20-35 gm of total fiber daily;Understanding of distribution of calorie intake throughout the day with the consumption of 4-5 meals/snacks    Improve shortness of breath with ADL's Yes    Intervention Provide education, individualized exercise plan and daily activity instruction to help decrease  symptoms of SOB with activities of daily living.    Expected Outcomes Short Term: Improve cardiorespiratory fitness to achieve a reduction of symptoms when performing ADLs;Long Term: Be able to perform more ADLs without symptoms or delay the onset of symptoms           Core Components/Risk Factors/Patient Goals Review:   Goals and Risk Factor Review     Row Name 03/16/24 1545 04/11/24 1629 05/07/24 0943         Core Components/Risk Factors/Patient Goals Review   Personal Goals Review Weight Management/Obesity;Improve shortness of breath with ADL's;Develop more efficient breathing techniques such as purse lipped breathing and diaphragmatic breathing and practicing self-pacing with activity. Weight Management/Obesity;Improve shortness of breath with ADL's;Develop more efficient breathing techniques such as purse lipped breathing and diaphragmatic breathing and practicing self-pacing with activity. Weight Management/Obesity;Improve shortness of breath with ADL's;Develop more efficient breathing techniques such as purse lipped breathing and diaphragmatic breathing and practicing self-pacing with activity.     Review Monthly Core Components/Risk Factors/Patient Goals Review as follows: Unable to assess. Bettis is scheduled to start the program next week. Monthly review of patient's Core Components/Risk Factors/Patient Goals are as follows: Goal in progress for improving her shortness of breath with ADLs and developing more efficient breathing techniques such as purse lipped breathing and diaphragmatic breathing; and practicing self-pacing with activity. Staff is working with her on her breathing techniques. Her oxygen needs have been WNL on room air. She is trying hard to build up her endurance and stamina to complete activities of daily living at home with decreased shortness of breath. Goal progressing on weight loss. Lamar wants to lose weight but hasn't made any changes yet. Her weight has maintained since starting the program. Signora will continue to benefit from PR for nutrition, education, exercise, and lifestyle modification. Monthly review of patient's Core Components/Risk Factors/Patient Goals are as follows: Goal in progress for improving her shortness  of breath with ADLs. Daylani is trying to build up her strength and endurance. She is progressing on Nustep and bike, increasing her workload, METs and speed. She is stable on room air with exertion. Goal met for developing more efficient breathing techniques such as purse lipped breathing and diaphragmatic breathing; and practicing self-pacing with activity. Tekeyah can perform purse lipped breathing while short of breath. She demonstrated this while performing the warmup and while exercising. She can initiate PLB on her own. She works on diaphragmatic breathing at home. Goal not met for weight loss. Alvie has not implanted techniques taught to her by our dietician. She is trying not to eat out as much. She has maintained her weight since starting the program. Ivannah will continue to benefit from PR for nutrition, education, exercise, and lifestyle modification.     Expected Outcomes Pt will show progress toward meeting expected goals and outcomes. Pt will show progress toward meeting expected goals and outcomes. Pt will show progress toward meeting expected goals and outcomes.        Core Components/Risk Factors/Patient Goals at Discharge (Final Review):   Goals and Risk Factor Review - 05/07/24 0943       Core Components/Risk Factors/Patient Goals Review   Personal Goals Review Weight Management/Obesity;Improve shortness of breath with ADL's;Develop more efficient breathing techniques such as purse lipped breathing and diaphragmatic breathing and practicing self-pacing with activity.    Review Monthly review of patient's Core Components/Risk Factors/Patient Goals are as follows: Goal in progress for improving her shortness of breath with ADLs. Tailyn is trying  to build up her strength and endurance. She is progressing on Nustep and bike, increasing her workload, METs and speed. She is stable on room air with exertion. Goal met for developing more efficient breathing techniques such as purse lipped breathing  and diaphragmatic breathing; and practicing self-pacing with activity. Magenta can perform purse lipped breathing while short of breath. She demonstrated this while performing the warmup and while exercising. She can initiate PLB on her own. She works on diaphragmatic breathing at home. Goal not met for weight loss. Gene has not implanted techniques taught to her by our dietician. She is trying not to eat out as much. She has maintained her weight since starting the program. Katoya will continue to benefit from PR for nutrition, education, exercise, and lifestyle modification.    Expected Outcomes Pt will show progress toward meeting expected goals and outcomes.          ITP Comments:   Comments: Pt is making expected progress toward Pulmonary Rehab goals after completing 12 session(s). Recommend continued exercise, life style modification, education, and utilization of breathing techniques to increase stamina and strength, while also decreasing shortness of breath with exertion.  Dr. Slater Staff is Medical Director for Pulmonary Rehab at [facility].

## 2024-05-17 ENCOUNTER — Encounter (HOSPITAL_COMMUNITY)
Admission: RE | Admit: 2024-05-17 | Discharge: 2024-05-17 | Disposition: A | Discharge status: Home / Self Care | Source: Ambulatory Visit | Attending: Pulmonary Disease | Admitting: Pulmonary Disease

## 2024-05-17 VITALS — Wt 141.5 lb

## 2024-05-17 DIAGNOSIS — J432 Centrilobular emphysema: Secondary | ICD-10-CM | POA: Insufficient documentation

## 2024-05-17 NOTE — Progress Notes (Signed)
 Daily Session Note  Patient Details  Name: Marissa Jacobs MRN: [id number] Date of Birth: 12/14/1935 Referring Provider:   Conrad Ports Pulmonary Rehab Walk Test from 03/12/2024 in [facility] for Heart, Vascular, & Lung Health  Referring Provider Dr. Alaine    Encounter Date: 05/17/2024  Check In:  Session Check In - 05/17/24 1330       Check-In   Supervising physician immediately available to respond to emergencies [facility] MD immediately available    Physician(s) Jackee Wyn Raddle, NP    Location [facility]    Staff Present Ronal Levin, RN, BSN;Casey Claudene Neita Moats, MS, ACSM-CEP, Exercise Physiologist    Virtual Visit No    Medication changes reported     No    Fall or balance concerns reported    No    Tobacco Cessation No Change    Warm-up and Cool-down Performed as group-led instruction    Resistance Training Performed Yes    VAD Patient? No    PAD/SET Patient? No      Pain Assessment   Currently in Pain? No/denies    Multiple Pain Sites No          Capillary Blood Glucose: No results found for this or any previous visit (from the past 24 hours).    Social History   Tobacco Use  Smoking Status Former   Current packs/day: 0.00   Types: Cigarettes   Quit date: 02/21/1963   Years since quitting: 61.2  Smokeless Tobacco Never  Tobacco Comments   in college    Goals Met:  Independence with exercise equipment Exercise tolerated well No report of concerns or symptoms today Strength training completed today  Goals Unmet:  Not Applicable  Comments: Service time is from 1315 to 1442    Dr. Slater Staff is Medical Director for Pulmonary Rehab at [facility].

## 2024-05-18 DIAGNOSIS — Q828 Other specified congenital malformations of skin: Secondary | ICD-10-CM | POA: Diagnosis not present

## 2024-05-18 DIAGNOSIS — M7741 Metatarsalgia, right foot: Secondary | ICD-10-CM | POA: Diagnosis not present

## 2024-05-22 ENCOUNTER — Encounter (HOSPITAL_COMMUNITY)

## 2024-05-24 ENCOUNTER — Encounter (HOSPITAL_COMMUNITY)

## 2024-05-29 ENCOUNTER — Encounter (HOSPITAL_COMMUNITY)

## 2024-05-31 ENCOUNTER — Encounter (HOSPITAL_COMMUNITY)

## 2024-06-05 ENCOUNTER — Encounter (HOSPITAL_COMMUNITY)
Admission: RE | Admit: 2024-06-05 | Discharge: 2024-06-05 | Disposition: A | Source: Ambulatory Visit | Attending: Pulmonary Disease

## 2024-06-05 DIAGNOSIS — J432 Centrilobular emphysema: Secondary | ICD-10-CM | POA: Diagnosis not present

## 2024-06-05 NOTE — Progress Notes (Signed)
 Daily Session Note  Patient Details  Name: Marissa Jacobs MRN: [id number] Date of Birth: March 23, 1936 Referring Provider:   Conrad Ports Pulmonary Rehab Walk Test from 03/12/2024 in [facility] for Heart, Vascular, & Lung Health  Referring Provider Dr. Alaine    Encounter Date: 06/05/2024  Check In:  Session Check In - 06/05/24 1422       Check-In   Supervising physician immediately available to respond to emergencies [facility] MD immediately available    Physician(s) Rosabel Mose, NP    Location [facility]    Staff Present Ronal Levin, RN, BSN;Brittian Renaldo Claudene Neita Moats, MS, ACSM-CEP, Exercise Physiologist    Virtual Visit No    Medication changes reported     No    Fall or balance concerns reported    No    Tobacco Cessation No Change    Warm-up and Cool-down Performed as group-led instruction    Resistance Training Performed Yes    VAD Patient? No    PAD/SET Patient? No      Pain Assessment   Currently in Pain? No/denies    Multiple Pain Sites No          Capillary Blood Glucose: No results found for this or any previous visit (from the past 24 hours).    Social History   Tobacco Use  Smoking Status Former   Current packs/day: 0.00   Types: Cigarettes   Quit date: 02/21/1963   Years since quitting: 61.3  Smokeless Tobacco Never  Tobacco Comments   in college    Goals Met:  Proper associated with RPD/PD & O2 Sat Independence with exercise equipment Exercise tolerated well No report of concerns or symptoms today Strength training completed today  Goals Unmet:  Not Applicable  Comments: Service time is from 1320 to 1435.    Dr. Slater Staff is Medical Director for Pulmonary Rehab at [facility]

## 2024-06-07 ENCOUNTER — Encounter (HOSPITAL_COMMUNITY)
Admission: RE | Admit: 2024-06-07 | Discharge: 2024-06-07 | Disposition: A | Discharge status: Home / Self Care | Source: Ambulatory Visit | Attending: Pulmonary Disease | Admitting: Pulmonary Disease

## 2024-06-07 DIAGNOSIS — J432 Centrilobular emphysema: Secondary | ICD-10-CM | POA: Diagnosis not present

## 2024-06-07 NOTE — Progress Notes (Signed)
 Daily Session Note  Patient Details  Name: Marissa Jacobs MRN: [id number] Date of Birth: 1935/08/24 Referring Provider:   Conrad Ports Pulmonary Rehab Walk Test from 03/12/2024 in [facility] for Heart, Vascular, & Lung Health  Referring Provider Dr. Alaine    Encounter Date: 06/07/2024  Check In:  Session Check In - 06/07/24 1342       Check-In   Supervising physician immediately available to respond to emergencies [facility] MD immediately available    Physician(s) Damien Braver, NP    Location [facility]    Staff Present Ronal Levin, RN, BSN;Moya Duan Claudene, Neita Moats, MS, ACSM-CEP, Exercise Physiologist;Randi Midge HECKLE, ACSM-CEP, Exercise Physiologist    Virtual Visit No    Medication changes reported     No    Fall or balance concerns reported    No    Tobacco Cessation No Change    Warm-up and Cool-down Performed as group-led instruction    Resistance Training Performed Yes    VAD Patient? No    PAD/SET Patient? No      Pain Assessment   Currently in Pain? No/denies    Multiple Pain Sites No          Capillary Blood Glucose: No results found for this or any previous visit (from the past 24 hours).    Social History   Tobacco Use  Smoking Status Former   Current packs/day: 0.00   Types: Cigarettes   Quit date: 02/21/1963   Years since quitting: 61.3  Smokeless Tobacco Never  Tobacco Comments   in college    Goals Met:  Proper associated with RPD/PD & O2 Sat Independence with exercise equipment Exercise tolerated well No report of concerns or symptoms today Strength training completed today  Goals Unmet:  Not Applicable  Comments: Service time is from 1323 to 1446.    Dr. Slater Staff is Medical Director for Pulmonary Rehab at [facility].

## 2024-06-12 ENCOUNTER — Encounter (HOSPITAL_COMMUNITY)
Admission: RE | Admit: 2024-06-12 | Discharge: 2024-06-12 | Disposition: A | Source: Ambulatory Visit | Attending: Pulmonary Disease

## 2024-06-12 VITALS — Wt 143.5 lb

## 2024-06-12 DIAGNOSIS — J432 Centrilobular emphysema: Secondary | ICD-10-CM | POA: Diagnosis not present

## 2024-06-12 NOTE — Progress Notes (Signed)
 Daily Session Note  Patient Details  Name: Marissa Jacobs MRN: [id number] Date of Birth: Aug 12, 1936 Referring Provider:   Conrad Ports [facility] Rehab Walk Test from 03/12/2024 in [facility]  Referring Provider Dr. Alaine    Encounter Date: 06/12/2024  Check In:  Session Check In - 06/12/24 1421       Check-In   Supervising physician immediately available to respond to emergencies [facility] MD immediately available    Physician(s) Lum Louis, NP    Location [facility]    Staff Present Ronal Levin, RN, BSN;Casey Claudene, Neita Moats, MS, ACSM-CEP, Exercise Physiologist;Randi Midge BS, ACSM-CEP, Exercise Physiologist    Virtual Visit No    Medication changes reported     No    Fall or balance concerns reported    No    Tobacco Cessation No Change    Warm-up and Cool-down Performed as group-led instruction    Resistance Training Performed Yes    VAD Patient? No    PAD/SET Patient? No      Pain Assessment   Currently in Pain? No/denies    Multiple Pain Sites No          Capillary Blood Glucose: No results found for this or any previous visit (from the past 24 hours).   Exercise Prescription Changes - 06/12/24 1400       Response to Exercise   Blood Pressure (Admit) 130/60    Blood Pressure (Exercise) 162/70    Blood Pressure (Exit) 112/64    Heart Rate (Admit) 84 bpm    Heart Rate (Exercise) 85 bpm    Heart Rate (Exit) 70 bpm    Oxygen Saturation (Admit) 95 %    Oxygen Saturation (Exercise) 96 %    Oxygen Saturation (Exit) 96 %    Rating of Perceived Exertion (Exercise) 11    Perceived Dyspnea (Exercise) 1    Duration Continue with 30 min of aerobic exercise without signs/symptoms of physical distress.    Intensity THRR unchanged      Progression   Progression Continue to progress workloads to maintain intensity without signs/symptoms of physical distress.      Resistance  Training   Training Prescription Yes    Weight blue bands    Reps 10-15    Time 10 Minutes      Recumbant Bike   Level 2    Minutes 15    METs 2.6      NuStep   Level 4    Minutes 15    METs 2.6          Social History   Tobacco Use  Smoking Status Former   Current packs/day: 0.00   Types: Cigarettes   Quit date: 02/21/1963   Years since quitting: 61.3  Smokeless Tobacco Never  Tobacco Comments   in college    Goals Met:  Proper associated with RPD/PD & O2 Sat Exercise tolerated well No report of concerns or symptoms today Strength training completed today  Goals Unmet:  Not Applicable  Comments: Service time is from 1325 to 1438.    Dr. Slater Staff is Medical Director for Pulmonary Rehab at [facility].

## 2024-06-13 NOTE — Progress Notes (Signed)
 Pulmonary Individual Treatment Plan  Patient Details  Name: Marissa Jacobs MRN: [id number] Date of Birth: [birth year] Referring Provider:   Conrad Ports Pulmonary Rehab Walk Test from 03/12/2024 in [facility]  Referring Provider Dr. Alaine    Initial Encounter Date:  Flowsheet Row Pulmonary Rehab Walk Test from 03/12/2024 in [facility]  Date 03/12/24    Visit Diagnosis: Centrilobular emphysema (HCC)  Patient's Home Medications on Admission:   Current Outpatient Medications:    amLODipine  (NORVASC ) 5 MG tablet, Take 1 tablet (5 mg total) by mouth daily., Disp: 90 tablet, Rfl: 3   Ascorbic Acid (VITAMIN C) 500 MG CAPS, Take 1 Capful by mouth daily., Disp: , Rfl:    Cholecalciferol 125 MCG (5000 UT) TABS, Take 1 tablet by mouth daily., Disp: , Rfl:    Cobalamin Combinations (B12 FOLATE) 800-800 MCG CAPS, 1 tablet Orally Once a day, Disp: , Rfl:    Coenzyme Q10 (CO Q 10 PO), Take 1 tablet by mouth daily., Disp: , Rfl:    EPIPEN 2-PAK 0.3 MG/0.3ML SOAJ injection, Inject 0.3 mg into the muscle as needed for anaphylaxis [employer])., Disp: , Rfl:    estradiol  (VIVELLE -DOT) 0.025 MG/24HR, Place 1 patch onto the skin 2 (two) times a week., Disp: 24 patch, Rfl: 3   fexofenadine (ALLEGRA) 180 MG tablet, Take 180 mg by mouth daily as needed for allergies., Disp: , Rfl:    Grape Seed Extract 100 MG CAPS, Take 1 capsule by mouth daily., Disp: , Rfl:    hydrochlorothiazide  (MICROZIDE ) 12.5 MG capsule, Take 12.5 mg by mouth daily., Disp: , Rfl:    losartan  (COZAAR ) 100 MG tablet, Take 1 tablet (100 mg total) by mouth daily., Disp: 90 tablet, Rfl: 3   MAGNESIUM GLYCINATE PO, Take 1 tablet by mouth daily., Disp: , Rfl:    Multiple Vitamin (MULTIVITAMIN) capsule, Take 1 capsule by mouth daily., Disp: , Rfl:    nitroGLYCERIN  (NITROSTAT ) 0.4 MG SL tablet, Place 1 tablet (0.4 mg total)  under the tongue every 5 (five) minutes as needed for chest pain. (Patient not taking: Reported on 03/12/2024), Disp: 30 tablet, Rfl: 0   Omega-3 1000 MG CAPS, Take 1 capsule by mouth daily at 6 (six) AM., Disp: , Rfl:    Omega-3 Fatty Acids (OMEGA-3 FISH OIL PO), Take 1,600 mg by mouth daily., Disp: , Rfl:    omeprazole (PRILOSEC) 20 MG capsule, Take 20 mg by mouth every other day., Disp: , Rfl:    progesterone  (PROMETRIUM ) 100 MG capsule, Take 100 mg by mouth daily., Disp: , Rfl:    pyridoxine (B-6) 100 MG tablet, Take 100 mg by mouth daily., Disp: , Rfl:    tretinoin (RETIN-A) 0.025 % cream, Apply 1 application topically at bedtime. , Disp: , Rfl:    vitamin k 100 MCG tablet, Take 100 mcg by mouth daily. Take after taking vitamin D3., Disp: , Rfl:    Zinc 50 MG TABS, Take 1 tablet by mouth daily., Disp: , Rfl:   Past Medical History: Past Medical History:  Diagnosis Date   Anemia    secondary to taking omeprazole-on iron   Cancer (HCC) 08/2020   lung   Diverticulosis 09/2002   Dysrhythmia    a-fib after surgery 09/04/20. treated with Metoprolol  for 6 week   GERD (gastroesophageal reflux disease)    Hyperlipidemia    Hypertension    Lung nodule  Osteopenia    Post traumatic stress disorder    STD (sexually transmitted disease)    HSV    Tobacco Use: Social History   Tobacco Use  Smoking Status Former   Current packs/day: 0.00   Types: Cigarettes   Quit date: 02/21/1963   Years since quitting: 61.3  Smokeless Tobacco Never  Tobacco Comments   in college    Labs: Review Flowsheet        No data to display          Capillary Blood Glucose: No results found for: GLUCAP   Pulmonary Assessment Scores:  Pulmonary Assessment Scores     Row Name 03/12/24 1513         ADL UCSD   ADL Phase Entry     SOB Score total 2       CAT Score   CAT Score 2       mMRC Score   mMRC Score 1       UCSD: Self-administered rating of dyspnea associated with  activities of daily living (ADLs) 6-point scale (0 = not at all to 5 = maximal or unable to do because of breathlessness)  Scoring Scores range from 0 to 120.  Minimally important difference is 5 units  CAT: CAT can identify the health impairment of COPD patients and is better correlated with disease progression.  CAT has a scoring range of zero to 40. The CAT score is classified into four groups of low (less than 10), medium (10 - 20), high (21-30) and very high (31-40) based on the impact level of disease on health status. A CAT score over 10 suggests significant symptoms.  A worsening CAT score could be explained by an exacerbation, poor medication adherence, poor inhaler technique, or progression of COPD or comorbid conditions.  CAT MCID is 2 points  mMRC: mMRC (Modified Medical Research Council) Dyspnea Scale is used to assess the degree of baseline functional disability in patients of respiratory disease due to dyspnea. No minimal important difference is established. A decrease in score of 1 point or greater is considered a positive change.   Pulmonary Function Assessment:  Pulmonary Function Assessment - 03/12/24 1422       Breath   Bilateral Breath Sounds Clear    Shortness of Breath No          Exercise Target Goals: Exercise Program Goal: Individual exercise prescription set using results from initial 6 min walk test and THRR while considering  patient's activity barriers and safety.   Exercise Prescription Goal: Initial exercise prescription builds to 30-45 minutes a day of aerobic activity, 2-3 days per week.  Home exercise guidelines will be given to patient during program as part of exercise prescription that the participant will acknowledge.  Activity Barriers & Risk Stratification:  Activity Barriers & Cardiac Risk Stratification - 03/12/24 1414       Activity Barriers & Cardiac Risk Stratification   Activity Barriers Deconditioning;Muscular Weakness;Shortness  of Breath;Left Knee Replacement    Cardiac Risk Stratification Moderate          6 Minute Walk:  6 Minute Walk     Row Name 03/12/24 1518         6 Minute Walk   Phase Initial     Distance 1096 feet     Walk Time 6 minutes     # of Rest Breaks 0     METS 2.08     RPE 11     Perceived  Dyspnea  0     VO2 Peak 7.79     Symptoms No     Resting HR 71 bpm     Resting BP 148/66     Resting Oxygen Saturation  99 %     Exercise Oxygen Saturation  during 6 min walk 96 %     Max Ex. HR 111 bpm     Max Ex. BP 190/60     2 Minute Post BP 160/60       Interval HR   1 Minute HR 90     2 Minute HR 105     3 Minute HR 111     4 Minute HR 107     5 Minute HR 109     6 Minute HR 110     2 Minute Post HR 72     Interval Heart Rate? Yes       Interval Oxygen   Interval Oxygen? Yes     Baseline Oxygen Saturation % 99 %     1 Minute Oxygen Saturation % 97 %     1 Minute Liters of Oxygen 0 L     2 Minute Oxygen Saturation % 96 %     2 Minute Liters of Oxygen 0 L     3 Minute Oxygen Saturation % 97 %     3 Minute Liters of Oxygen 0 L     4 Minute Oxygen Saturation % 96 %     4 Minute Liters of Oxygen 0 L     5 Minute Oxygen Saturation % 97 %     5 Minute Liters of Oxygen 0 L     6 Minute Oxygen Saturation % 96 %     6 Minute Liters of Oxygen 0 L     2 Minute Post Oxygen Saturation % 98 %     2 Minute Post Liters of Oxygen 0 L        Oxygen Initial Assessment:  Oxygen Initial Assessment - 03/12/24 1420       Home Oxygen   Home Oxygen Device None    Sleep Oxygen Prescription None    Home Exercise Oxygen Prescription None    Home Resting Oxygen Prescription None      Initial 6 min Walk   Oxygen Used None      Program Oxygen Prescription   Program Oxygen Prescription None      Intervention   Short Term Goals To learn and understand importance of monitoring SPO2 with pulse oximeter and demonstrate accurate use of the pulse oximeter.;To learn and understand importance  of maintaining oxygen saturations>88%;To learn and demonstrate proper pursed lip breathing techniques or other breathing techniques. ;To learn and demonstrate proper use of respiratory medications    Long  Term Goals Maintenance of O2 saturations>88%;Compliance with respiratory medication;Verbalizes importance of monitoring SPO2 with pulse oximeter and return demonstration;Exhibits proper breathing techniques, such as pursed lip breathing or other method taught during program session;Demonstrates proper use of MDI's          Oxygen Re-Evaluation:  Oxygen Re-Evaluation     Row Name 03/20/24 0912 04/17/24 0847 05/08/24 0850 06/05/24 1546       Program Oxygen Prescription   Program Oxygen Prescription None None None None      Home Oxygen   Home Oxygen Device None None None None    Sleep Oxygen Prescription None None None None    Home Exercise Oxygen Prescription None None None None  Home Resting Oxygen Prescription None None None None      Goals/Expected Outcomes   Short Term Goals To learn and understand importance of monitoring SPO2 with pulse oximeter and demonstrate accurate use of the pulse oximeter.;To learn and understand importance of maintaining oxygen saturations>88%;To learn and demonstrate proper pursed lip breathing techniques or other breathing techniques. ;To learn and demonstrate proper use of respiratory medications To learn and understand importance of monitoring SPO2 with pulse oximeter and demonstrate accurate use of the pulse oximeter.;To learn and understand importance of maintaining oxygen saturations>88%;To learn and demonstrate proper pursed lip breathing techniques or other breathing techniques. ;To learn and demonstrate proper use of respiratory medications To learn and understand importance of monitoring SPO2 with pulse oximeter and demonstrate accurate use of the pulse oximeter.;To learn and understand importance of maintaining oxygen saturations>88%;To learn and  demonstrate proper pursed lip breathing techniques or other breathing techniques. ;To learn and demonstrate proper use of respiratory medications To learn and understand importance of monitoring SPO2 with pulse oximeter and demonstrate accurate use of the pulse oximeter.;To learn and understand importance of maintaining oxygen saturations>88%;To learn and demonstrate proper pursed lip breathing techniques or other breathing techniques. ;To learn and demonstrate proper use of respiratory medications    Long  Term Goals Maintenance of O2 saturations>88%;Compliance with respiratory medication;Verbalizes importance of monitoring SPO2 with pulse oximeter and return demonstration;Exhibits proper breathing techniques, such as pursed lip breathing or other method taught during program session;Demonstrates proper use of MDI's Maintenance of O2 saturations>88%;Compliance with respiratory medication;Verbalizes importance of monitoring SPO2 with pulse oximeter and return demonstration;Exhibits proper breathing techniques, such as pursed lip breathing or other method taught during program session;Demonstrates proper use of MDI's Maintenance of O2 saturations>88%;Compliance with respiratory medication;Verbalizes importance of monitoring SPO2 with pulse oximeter and return demonstration;Exhibits proper breathing techniques, such as pursed lip breathing or other method taught during program session;Demonstrates proper use of MDI's Maintenance of O2 saturations>88%;Compliance with respiratory medication;Verbalizes importance of monitoring SPO2 with pulse oximeter and return demonstration;Exhibits proper breathing techniques, such as pursed lip breathing or other method taught during program session;Demonstrates proper use of MDI's    Goals/Expected Outcomes Compliance and understanding of oxygen saturation monitoring and breathing techniques to decrease shortness of breath. Compliance and understanding of oxygen saturation  monitoring and breathing techniques to decrease shortness of breath. Compliance and understanding of oxygen saturation monitoring and breathing techniques to decrease shortness of breath. Compliance and understanding of oxygen saturation monitoring and breathing techniques to decrease shortness of breath.       Oxygen Discharge (Final Oxygen Re-Evaluation):  Oxygen Re-Evaluation - 06/05/24 1546       Program Oxygen Prescription   Program Oxygen Prescription None      Home Oxygen   Home Oxygen Device None    Sleep Oxygen Prescription None    Home Exercise Oxygen Prescription None    Home Resting Oxygen Prescription None      Goals/Expected Outcomes   Short Term Goals To learn and understand importance of monitoring SPO2 with pulse oximeter and demonstrate accurate use of the pulse oximeter.;To learn and understand importance of maintaining oxygen saturations>88%;To learn and demonstrate proper pursed lip breathing techniques or other breathing techniques. ;To learn and demonstrate proper use of respiratory medications    Long  Term Goals Maintenance of O2 saturations>88%;Compliance with respiratory medication;Verbalizes importance of monitoring SPO2 with pulse oximeter and return demonstration;Exhibits proper breathing techniques, such as pursed lip breathing or other method taught during program session;Demonstrates proper use of  MDI's    Goals/Expected Outcomes Compliance and understanding of oxygen saturation monitoring and breathing techniques to decrease shortness of breath.          Initial Exercise Prescription:  Initial Exercise Prescription - 03/12/24 1500       Date of Initial Exercise RX and Referring Provider   Date 03/12/24    Referring Provider Dr. Alaine    Expected Discharge Date 06/07/24      Recumbant Bike   Level 1    RPM 50    Watts 40    Minutes 15    METs 1.9      NuStep   Level 1    SPM 64    Minutes 15    METs 1.9      Prescription Details    Frequency (times per week) 2    Duration Progress to 30 minutes of continuous aerobic without signs/symptoms of physical distress      Intensity   THRR 40-80% of Max Heartrate 54-107    Ratings of Perceived Exertion 11-13    Perceived Dyspnea 0-4      Progression   Progression Continue to progress workloads to maintain intensity without signs/symptoms of physical distress.      Resistance Training   Training Prescription Yes    Weight red bands    Reps 10-15          Perform Capillary Blood Glucose checks as needed.  Exercise Prescription Changes:   Exercise Prescription Changes     Row Name 04/03/24 1500 04/17/24 1400 05/01/24 1500 05/15/24 1500 05/17/24 1516     Response to Exercise   Blood Pressure (Admit) 140/66 156/70 130/60 144/72 146/60   Blood Pressure (Exercise) 168/70 142/76 140/80 140/70 --   Blood Pressure (Exit) 138/64 122/62 128/62 132/68 132/62   Heart Rate (Admit) 72 bpm 82 bpm 91 bpm 77 bpm 75 bpm   Heart Rate (Exercise) 98 bpm 94 bpm 101 bpm 90 bpm 81 bpm   Heart Rate (Exit) 68 bpm 71 bpm 77 bpm 69 bpm 76 bpm   Oxygen Saturation (Admit) 98 % 95 % 96 % 97 % 97 %   Oxygen Saturation (Exercise) 97 % 96 % 97 % 97 % 97 %   Oxygen Saturation (Exit) 97 % 96 % 96 % 95 % 96 %   Rating of Perceived Exertion (Exercise) 12 13 12 12 12    Perceived Dyspnea (Exercise) 0 3 1 1 1    Duration Continue with 30 min of aerobic exercise without signs/symptoms of physical distress. Continue with 30 min of aerobic exercise without signs/symptoms of physical distress. Continue with 30 min of aerobic exercise without signs/symptoms of physical distress. Continue with 30 min of aerobic exercise without signs/symptoms of physical distress. Continue with 30 min of aerobic exercise without signs/symptoms of physical distress.   Intensity THRR unchanged THRR unchanged THRR unchanged THRR unchanged THRR unchanged     Progression   Progression Continue to progress workloads to maintain  intensity without signs/symptoms of physical distress. Continue to progress workloads to maintain intensity without signs/symptoms of physical distress. -- Continue to progress workloads to maintain intensity without signs/symptoms of physical distress. Continue to progress workloads to maintain intensity without signs/symptoms of physical distress.     Resistance Training   Training Prescription Yes Yes Yes Yes Yes   Weight red bands red bands blue bands blue bands blue bands   Reps 10-15 10-15 10-15 10-15 10-15   Time 10 Minutes 10 Minutes 10 Minutes  10 Minutes 10 Minutes     Recumbant Bike   Level 2 3 2 3 3    RPM 51 48 52 52 --   Watts 17 13 16 23  --   Minutes 15 15 15 15 15    METs 2.2 2.3 2 2.6 2.7     NuStep   Level 3 3 4 4 4    SPM 85 71 78 73 74   Minutes 15 15 15 15 15    METs 2.8 2.4 2.8 2.8 2.9    Row Name 06/12/24 1400             Response to Exercise   Blood Pressure (Admit) 130/60       Blood Pressure (Exercise) 162/70       Blood Pressure (Exit) 112/64       Heart Rate (Admit) 84 bpm       Heart Rate (Exercise) 85 bpm       Heart Rate (Exit) 70 bpm       Oxygen Saturation (Admit) 95 %       Oxygen Saturation (Exercise) 96 %       Oxygen Saturation (Exit) 96 %       Rating of Perceived Exertion (Exercise) 11       Perceived Dyspnea (Exercise) 1       Duration Continue with 30 min of aerobic exercise without signs/symptoms of physical distress.       Intensity THRR unchanged         Progression   Progression Continue to progress workloads to maintain intensity without signs/symptoms of physical distress.         Resistance Training   Training Prescription Yes       Weight blue bands       Reps 10-15       Time 10 Minutes         Recumbant Bike   Level 2       Minutes 15       METs 2.6         NuStep   Level 4       Minutes 15       METs 2.6          Exercise Comments:   Exercise Comments     Row Name 03/22/24 1505           Exercise  Comments Pt completed first day of exercise. She exercised for 15 min on the Nustep and recumbent bike. She performed the warmup and cooldown standing without limitations. Discussed METs with good reception.          Exercise Goals and Review:   Exercise Goals     Row Name 03/12/24 1414             Exercise Goals   Increase Physical Activity Yes       Intervention Provide advice, education, support and counseling about physical activity/exercise needs.;Develop an individualized exercise prescription for aerobic and resistive training based on initial evaluation findings, risk stratification, comorbidities and participant's personal goals.       Expected Outcomes Short Term: Attend rehab on a regular basis to increase amount of physical activity.;Long Term: Add in home exercise to make exercise part of routine and to increase amount of physical activity.;Long Term: Exercising regularly at least 3-5 days a week.       Increase Strength and Stamina Yes       Intervention Provide advice, education, support and counseling about physical  activity/exercise needs.;Develop an individualized exercise prescription for aerobic and resistive training based on initial evaluation findings, risk stratification, comorbidities and participant's personal goals.       Expected Outcomes Short Term: Increase workloads from initial exercise prescription for resistance, speed, and METs.;Short Term: Perform resistance training exercises routinely during rehab and add in resistance training at home;Long Term: Improve cardiorespiratory fitness, muscular endurance and strength as measured by increased METs and functional capacity ( )       Able to understand and use rate of perceived exertion (RPE) scale Yes       Intervention Provide education and explanation on how to use RPE scale       Expected Outcomes Short Term: Able to use RPE daily in rehab to express subjective intensity level;Long Term:  Able to use RPE to  guide intensity level when exercising independently       Able to understand and use Dyspnea scale Yes       Intervention Provide education and explanation on how to use Dyspnea scale       Expected Outcomes Short Term: Able to use Dyspnea scale daily in rehab to express subjective sense of shortness of breath during exertion;Long Term: Able to use Dyspnea scale to guide intensity level when exercising independently       Knowledge and understanding of Target Heart Rate Range (THRR) Yes       Intervention Provide education and explanation of THRR including how the numbers were predicted and where they are located for reference       Expected Outcomes Short Term: Able to state/look up THRR;Long Term: Able to use THRR to govern intensity when exercising independently;Short Term: Able to use daily as guideline for intensity in rehab       Understanding of Exercise Prescription Yes       Intervention Provide education, explanation, and written materials on patient's individual exercise prescription       Expected Outcomes Short Term: Able to explain program exercise prescription;Long Term: Able to explain home exercise prescription to exercise independently          Exercise Goals Re-Evaluation :  Exercise Goals Re-Evaluation     Row Name 03/20/24 0911 04/17/24 0844 05/08/24 0846 06/05/24 1544       Exercise Goal Re-Evaluation   Exercise Goals Review Increase Physical Activity;Able to understand and use Dyspnea scale;Understanding of Exercise Prescription;Increase Strength and Stamina;Knowledge and understanding of Target Heart Rate Range (THRR);Able to understand and use rate of perceived exertion (RPE) scale Increase Physical Activity;Able to understand and use Dyspnea scale;Understanding of Exercise Prescription;Increase Strength and Stamina;Knowledge and understanding of Target Heart Rate Range (THRR);Able to understand and use rate of perceived exertion (RPE) scale Increase Physical  Activity;Able to understand and use Dyspnea scale;Understanding of Exercise Prescription;Increase Strength and Stamina;Knowledge and understanding of Target Heart Rate Range (THRR);Able to understand and use rate of perceived exertion (RPE) scale Increase Physical Activity;Able to understand and use Dyspnea scale;Understanding of Exercise Prescription;Increase Strength and Stamina;Knowledge and understanding of Target Heart Rate Range (THRR);Able to understand and use rate of perceived exertion (RPE) scale    Comments Pt is scheduled to begin exercise 8/7. Will progress as tolerated. Pt has completed 5 exercise sessions, missing 2. She is exercising on the recumbent stepper for 15 min, level 3, METs 2.6. She then is exercising on the recumbent bike for 15 min, level 2, METs 2.6. She tried level 3 last session but felt it was too hard. She performs warm up and  cool down without significant limitations, using blue bands, 5.8 lbs. Will progress as tolerated. Pt has completed 10 exercise sessions, missing 2. She is exercising on the recumbent stepper for 15 min, level 4, METs 2.6. She then is exercising on the recumbent bike for 15 min, level 2, METs 2.6. Level 3 is too hard for her. She performs warm up and cool down without significant limitations, using blue bands, 5.8 lbs. Will progress as tolerated. Pt has completed 13 exercise sessions, missing 2 weeks recently due to travel. She is exercising on the recumbent stepper for 15 min, level 4, METs 3. She then is exercising on the recumbent bike for 15 min, level 2-3, METs 2.5. She walked daily on vacation and was able to maintain her METs when she returned. She performs warm up and cool down without significant limitations, using blue bands, 5.8 lbs. Will progress as tolerated.    Expected Outcomes Through exercise at rehab and home, the patient will decrease shortness of breath and feel confident in carrying out an exercise regimen at home. Through exercise at  rehab and home, the patient will decrease shortness of breath and feel confident in carrying out an exercise regimen at home. Through exercise at rehab and home, the patient will decrease shortness of breath and feel confident in carrying out an exercise regimen at home. Through exercise at rehab and home, the patient will decrease shortness of breath and feel confident in carrying out an exercise regimen at home.       Discharge Exercise Prescription (Final Exercise Prescription Changes):  Exercise Prescription Changes - 06/12/24 1400       Response to Exercise   Blood Pressure (Admit) 130/60    Blood Pressure (Exercise) 162/70    Blood Pressure (Exit) 112/64    Heart Rate (Admit) 84 bpm    Heart Rate (Exercise) 85 bpm    Heart Rate (Exit) 70 bpm    Oxygen Saturation (Admit) 95 %    Oxygen Saturation (Exercise) 96 %    Oxygen Saturation (Exit) 96 %    Rating of Perceived Exertion (Exercise) 11    Perceived Dyspnea (Exercise) 1    Duration Continue with 30 min of aerobic exercise without signs/symptoms of physical distress.    Intensity THRR unchanged      Progression   Progression Continue to progress workloads to maintain intensity without signs/symptoms of physical distress.      Resistance Training   Training Prescription Yes    Weight blue bands    Reps 10-15    Time 10 Minutes      Recumbant Bike   Level 2    Minutes 15    METs 2.6      NuStep   Level 4    Minutes 15    METs 2.6          Nutrition:  Target Goals: Understanding of nutrition guidelines, daily intake of sodium 1500mg , cholesterol 200mg , calories 30% from fat and 7% or less from saturated fats, daily to have 5 or more servings of fruits and vegetables.  Biometrics:  Pre Biometrics - 03/12/24 1513       Pre Biometrics   Grip Strength 20 kg           Nutrition Therapy Plan and Nutrition Goals:  Nutrition Therapy & Goals - 03/22/24 1439       Nutrition Therapy   Diet General Healthy  Diet      Personal Nutrition Goals   Nutrition Goal  Patient to improve diet quality by using the plate method as a guide for meal planning to include lean protein/plant protein, fruits, vegetables, whole grains, nonfat dairy as part of a well-balanced diet.    Comments George has medical hisory of HTN, hyperlipidemia, lung cancer. She follows a normal diet. Per documentation with cardiology on 08/23/23, she declines statin. BMI remains appropriate for age. Patient will benefit from participation in pulmonary rehab for nutrition education, exercise, and lifestyle modification.      Intervention Plan   Intervention Prescribe, educate and counsel regarding individualized specific dietary modifications aiming towards targeted core components such as weight, hypertension, lipid management, diabetes, heart failure and other comorbidities.;Nutrition handout(s) given to patient.    Expected Outcomes Short Term Goal: Understand basic principles of dietary content, such as calories, fat, sodium, cholesterol and nutrients.;Long Term Goal: Adherence to prescribed nutrition plan.          Nutrition Assessments:  Nutrition Assessments - 03/29/24 0922       Rate Your Plate Scores   Pre Score 70         MEDIFICTS Score Key: >=70 Need to make dietary changes  40-70 Heart Healthy Diet <= 40 Therapeutic Level Cholesterol Diet  Flowsheet Row PULMONARY REHAB OTHER RESPIRATORY from 03/27/2024 in [facility] for Heart, Vascular, & Lung Health  Picture Your Plate Total Score on Admission 70   Picture Your Plate Scores: <59 Unhealthy dietary pattern with much room for improvement. 41-50 Dietary pattern unlikely to meet recommendations for good health and room for improvement. 51-60 More healthful dietary pattern, with some room for improvement.  >60 Healthy dietary pattern, although there may be some specific behaviors that could be improved.    Nutrition Goals Re-Evaluation:   Nutrition Goals Re-Evaluation     Row Name 03/22/24 1439             Goals   Current Weight 143 lb 15.4 oz (65.3 kg)       Comment no recent lipid panel avaiable for review       Expected Outcome Siara has medical hisory of HTN, hyperlipidemia, lung cancer. She follows a normal diet. Per documentation with cardiology on 08/23/23, she declines statin. BMI remains appropriate for age. Patient will benefit from participation in pulmonary rehab for nutrition education, exercise, and lifestyle modification.          Nutrition Goals Discharge (Final Nutrition Goals Re-Evaluation):  Nutrition Goals Re-Evaluation - 03/22/24 1439       Goals   Current Weight 143 lb 15.4 oz (65.3 kg)    Comment no recent lipid panel avaiable for review    Expected Outcome Raniah has medical hisory of HTN, hyperlipidemia, lung cancer. She follows a normal diet. Per documentation with cardiology on 08/23/23, she declines statin. BMI remains appropriate for age. Patient will benefit from participation in pulmonary rehab for nutrition education, exercise, and lifestyle modification.          Psychosocial: Target Goals: Acknowledge presence or absence of significant depression and/or stress, maximize coping skills, provide positive support system. Participant is able to verbalize types and ability to use techniques and skills needed for reducing stress and depression.  Initial Review & Psychosocial Screening:  Initial Psych Review & Screening - 03/12/24 1405       Initial Review   Current issues with None Identified      Family Dynamics   Good Support System? Yes      Barriers   Psychosocial barriers  to participate in program There are no identifiable barriers or psychosocial needs.      Screening Interventions   Interventions Encouraged to exercise          Quality of Life Scores:  Scores of 19 and below usually indicate a poorer quality of life in these areas.  A difference of  2-3 points is a  clinically meaningful difference.  A difference of 2-3 points in the total score of the Quality of Life Index has been associated with significant improvement in overall quality of life, self-image, physical symptoms, and general health in studies assessing change in quality of life.  PHQ-9: Review Flowsheet  More data may exist      03/12/2024 07/06/2023 04/22/2023 06/21/2022 07/13/2021  Depression screen PHQ 2/9  Decreased Interest 0 0 0 0 0  Down, Depressed, Hopeless 0 0 0 0 0  PHQ - 2 Score 0 0 0 0 0  Altered sleeping 0 - - - -  Tired, decreased energy 0 - - - -  Change in appetite 0 - - - -  Feeling bad or failure about yourself  0 - - - -  Trouble concentrating 0 - - - -  Moving slowly or fidgety/restless 0 - - - -  Suicidal thoughts 0 - - - -  PHQ-9 Score 0 - - - -  Difficult doing work/chores Not difficult at all - - - -   Interpretation of Total Score  Total Score Depression Severity:  1-4 = Minimal depression, 5-9 = Mild depression, 10-14 = Moderate depression, 15-19 = Moderately severe depression, 20-27 = Severe depression   Psychosocial Evaluation and Intervention:  Psychosocial Evaluation - 03/12/24 1512       Psychosocial Evaluation & Interventions   Interventions Encouraged to exercise with the program and follow exercise prescription    Comments Malley denies any psychosocial barriers or concerns at this time.    Expected Outcomes For Lakaya to participate in PR free of any psychosocial barriers or concerns    Continue Psychosocial Services  No Follow up required          Psychosocial Re-Evaluation:  Psychosocial Re-Evaluation     Row Name 03/16/24 1544 04/11/24 1628 05/07/24 0942 06/11/24 1142       Psychosocial Re-Evaluation   Current issues with None Identified None Identified None Identified None Identified    Comments Monthly psychosocial re-evaluation as follows: Nysa has not started the program yet. She is scheduled to start next week. Monthly  psychosocial re-evaluation as follows: Jilene denies any needs or resources. She states she has great support from family and friends. Monthly psychosocial re-evaluation as follows: Naiara continues to deny any psy/soc barriers or concerns. She denies and additional resources or referrals. She states she has great support from family and friends. Monthly psychosocial re-evaluation as follows: Marletta returned from a week long vaction in [place]. She was happy to be able to walk with decreased shortness of breath. Liliahna continues to deny any psy/soc barriers or concerns. She denies and additional resources or referrals. She states she has great support from family and friends.    Expected Outcomes For Rayvin to participate in PR free of any psychosocial barriers or concerns For Wandalee to participate in PR free of any psychosocial barriers or concerns For Taitum to participate in PR free of any psychosocial barriers or concerns For Insiya to participate in PR free of any psychosocial barriers or concerns    Interventions Encouraged to attend Pulmonary Rehabilitation for  the exercise Encouraged to attend Pulmonary Rehabilitation for the exercise Encouraged to attend Pulmonary Rehabilitation for the exercise Encouraged to attend Pulmonary Rehabilitation for the exercise    Continue Psychosocial Services  No Follow up required No Follow up required No Follow up required No Follow up required       Psychosocial Discharge (Final Psychosocial Re-Evaluation):  Psychosocial Re-Evaluation - 06/11/24 1142       Psychosocial Re-Evaluation   Current issues with None Identified    Comments Monthly psychosocial re-evaluation as follows: Clarrisa returned from a week long vaction in [place]. She was happy to be able to walk with decreased shortness of breath. Natisha continues to deny any psy/soc barriers or concerns. She denies and additional resources or referrals. She states she has great support from family and friends.    Expected  Outcomes For Tristan to participate in PR free of any psychosocial barriers or concerns    Interventions Encouraged to attend Pulmonary Rehabilitation for the exercise    Continue Psychosocial Services  No Follow up required          Education: Education Goals: Education classes will be provided on a weekly basis, covering required topics. Participant will state understanding/return demonstration of topics presented.  Learning Barriers/Preferences:  Learning Barriers/Preferences - 03/12/24 1406       Learning Barriers/Preferences   Learning Barriers Hearing   hearing loss   Learning Preferences None          Education Topics: Know Your Numbers Group instruction that is supported by a PowerPoint presentation. Instructor discusses importance of knowing and understanding resting, exercise, and post-exercise oxygen saturation, heart rate, and blood pressure. Oxygen saturation, heart rate, blood pressure, rating of perceived exertion, and dyspnea are reviewed along with a normal range for these values.  Flowsheet Row PULMONARY REHAB OTHER RESPIRATORY from 05/03/2024 in [facility] for Heart, Vascular, & Lung Health  Date 05/03/24  Educator EP  Instruction Review Code 1- Verbalizes Understanding    Exercise for the Pulmonary Patient Group instruction that is supported by a PowerPoint presentation. Instructor discusses benefits of exercise, core components of exercise, frequency, duration, and intensity of an exercise routine, importance of utilizing pulse oximetry during exercise, safety while exercising, and options of places to exercise outside of rehab.  Flowsheet Row PULMONARY REHAB OTHER RESPIRATORY from 04/26/2024 in [facility] for Heart, Vascular, & Lung Health  Date 04/26/24  Educator EP  Instruction Review Code 1- Verbalizes Understanding    MET Level  Group instruction provided by PowerPoint, verbal discussion, and written  material to support subject matter. Instructor reviews what METs are and how to increase METs.    Pulmonary Medications Verbally interactive group education provided by instructor with focus on inhaled medications and proper administration.   Anatomy and Physiology of the Respiratory System Group instruction provided by PowerPoint, verbal discussion, and written material to support subject matter. Instructor reviews respiratory cycle and anatomical components of the respiratory system and their functions. Instructor also reviews differences in obstructive and restrictive respiratory diseases with examples of each.    Oxygen Safety Group instruction provided by PowerPoint, verbal discussion, and written material to support subject matter. There is an overview of "What is Oxygen" and "Why do we need it".  Instructor also reviews how to create a safe environment for oxygen use, the importance of using oxygen as prescribed, and the risks of noncompliance. There is a brief discussion on traveling with oxygen and resources the patient  may utilize.   Oxygen Use Group instruction provided by PowerPoint, verbal discussion, and written material to discuss how supplemental oxygen is prescribed and different types of oxygen supply systems. Resources for more information are provided.  Flowsheet Row PULMONARY REHAB OTHER RESPIRATORY from 05/17/2024 in [facility] for Heart, Vascular, & Lung Health  Date 05/17/24  Educator RT  Instruction Review Code 1- Verbalizes Understanding    Breathing Techniques Group instruction that is supported by demonstration and informational handouts. Instructor discusses the benefits of pursed lip and diaphragmatic breathing and detailed demonstration on how to perform both.     Risk Factor Reduction Group instruction that is supported by a PowerPoint presentation. Instructor discusses the definition of a risk factor, different risk factors for  pulmonary disease, and how the heart and lungs work together. Flowsheet Row PULMONARY REHAB OTHER RESPIRATORY from 03/22/2024 in [facility] for Heart, Vascular, & Lung Health  Date 03/22/24  Educator EP  Instruction Review Code 1- Verbalizes Understanding    Pulmonary Diseases Group instruction provided by PowerPoint, verbal discussion, and written material to support subject matter. Instructor gives an overview of the different type of pulmonary diseases. There is also a discussion on risk factors and symptoms as well as ways to manage the diseases. Flowsheet Row PULMONARY REHAB OTHER RESPIRATORY from 04/05/2024 in [facility] for Heart, Vascular, & Lung Health  Date 04/05/24  Educator RT  Instruction Review Code 1- Verbalizes Understanding    Stress and Energy Conservation Group instruction provided by PowerPoint, verbal discussion, and written material to support subject matter. Instructor gives an overview of stress and the impact it can have on the body. Instructor also reviews ways to reduce stress. There is also a discussion on energy conservation and ways to conserve energy throughout the day.   Warning Signs and Symptoms Group instruction provided by PowerPoint, verbal discussion, and written material to support subject matter. Instructor reviews warning signs and symptoms of stroke, heart attack, cold and flu. Instructor also reviews ways to prevent the spread of infection. Flowsheet Row PULMONARY REHAB OTHER RESPIRATORY from 06/07/2024 in [facility] for Heart, Vascular, & Lung Health  Date 06/07/24  Educator RN  Instruction Review Code 1- Verbalizes Understanding    Other Education Group or individual verbal, written, or video instructions that support the educational goals of the pulmonary rehab program.    Knowledge Questionnaire Score:  Knowledge Questionnaire Score - 03/12/24 1513       Knowledge  Questionnaire Score   Pre Score 17/18          Core Components/Risk Factors/Patient Goals at Admission:  Personal Goals and Risk Factors at Admission - 03/12/24 1407       Core Components/Risk Factors/Patient Goals on Admission    Weight Management Weight Loss;Yes    Intervention Weight Management: Develop a combined nutrition and exercise program designed to reach desired caloric intake, while maintaining appropriate intake of nutrient and fiber, sodium and fats, and appropriate energy expenditure required for the weight goal.;Weight Management: Provide education and appropriate resources to help participant work on and attain dietary goals.;Weight Management/Obesity: Establish reasonable short term and long term weight goals.;Obesity: Provide education and appropriate resources to help participant work on and attain dietary goals.    Admit Weight 143 lb 15.4 oz (65.3 kg)    Expected Outcomes Short Term: Continue to assess and modify interventions until short term weight is achieved;Long Term: Adherence to nutrition and  physical activity/exercise program aimed toward attainment of established weight goal;Weight Loss: Understanding of general recommendations for a balanced deficit meal plan, which promotes 1-2 lb weight loss per week and includes a negative energy balance of [id number] kcal/d;Understanding recommendations for meals to include 15-35% energy as protein, 25-35% energy from fat, 35-60% energy from carbohydrates, less than 200mg  of dietary cholesterol, 20-35 gm of total fiber daily;Understanding of distribution of calorie intake throughout the day with the consumption of 4-5 meals/snacks    Improve shortness of breath with ADL's Yes    Intervention Provide education, individualized exercise plan and daily activity instruction to help decrease symptoms of SOB with activities of daily living.    Expected Outcomes Short Term: Improve cardiorespiratory fitness to achieve a reduction of  symptoms when performing ADLs;Long Term: Be able to perform more ADLs without symptoms or delay the onset of symptoms          Core Components/Risk Factors/Patient Goals Review:   Goals and Risk Factor Review     Row Name 03/16/24 1545 04/11/24 1629 05/07/24 0943 06/11/24 1143       Core Components/Risk Factors/Patient Goals Review   Personal Goals Review Weight Management/Obesity;Improve shortness of breath with ADL's;Develop more efficient breathing techniques such as purse lipped breathing and diaphragmatic breathing and practicing self-pacing with activity. Weight Management/Obesity;Improve shortness of breath with ADL's;Develop more efficient breathing techniques such as purse lipped breathing and diaphragmatic breathing and practicing self-pacing with activity. Weight Management/Obesity;Improve shortness of breath with ADL's;Develop more efficient breathing techniques such as purse lipped breathing and diaphragmatic breathing and practicing self-pacing with activity. Weight Management/Obesity;Improve shortness of breath with ADL's    Review Monthly Core Components/Risk Factors/Patient Goals Review as follows: Unable to assess. Bettis is scheduled to start the program next week. Monthly review of patient's Core Components/Risk Factors/Patient Goals are as follows: Goal in progress for improving her shortness of breath with ADLs and developing more efficient breathing techniques such as purse lipped breathing and diaphragmatic breathing; and practicing self-pacing with activity. Staff is working with her on her breathing techniques. Her oxygen needs have been WNL on room air. She is trying hard to build up her endurance and stamina to complete activities of daily living at home with decreased shortness of breath. Goal progressing on weight loss. Rekia wants to lose weight but hasn't made any changes yet. Her weight has maintained since starting the program. Kanani will continue to benefit from PR  for nutrition, education, exercise, and lifestyle modification. Monthly review of patient's Core Components/Risk Factors/Patient Goals are as follows: Goal in progress for improving her shortness of breath with ADLs. Marithza is trying to build up her strength and endurance. She is progressing on Nustep and bike, increasing her workload, METs and speed. She is stable on room air with exertion. Goal met for developing more efficient breathing techniques such as purse lipped breathing and diaphragmatic breathing; and practicing self-pacing with activity. Leasia can perform purse lipped breathing while short of breath. She demonstrated this while performing the warmup and while exercising. She can initiate PLB on her own. She works on diaphragmatic breathing at home. Goal not met for weight loss. Dallie has not implanted techniques taught to her by our dietician. She is trying not to eat out as much. She has maintained her weight since starting the program. Corayma will continue to benefit from PR for nutrition, education, exercise, and lifestyle modification. Monthly review of patient's Core Components/Risk Factors/Patient Goals are as follows: Goal in progress for improving her  shortness of breath with ADLs. Zoya is trying to build up her strength and endurance. She is progressing on Nustep and bike, increasing her workload, METs and speed. She is stable on room air with exertion and was happy to be able to walk around [place] with decreased shortness of breath. Goal not met for weight loss. Shadow returned to class this week after vacationing in [place]. She stated she ate and drank what she wanted. Saloni has not implanted techniques taught to her by our dietician. She is trying not to eat out as much. She has maintained her weight since starting the program. Corinne will continue to benefit from PR for nutrition, education, exercise, and lifestyle modification.    Expected Outcomes Pt will show progress toward meeting expected  goals and outcomes. Pt will show progress toward meeting expected goals and outcomes. Pt will show progress toward meeting expected goals and outcomes. Pt will show progress toward meeting expected goals and outcomes.       Core Components/Risk Factors/Patient Goals at Discharge (Final Review):   Goals and Risk Factor Review - 06/11/24 1143       Core Components/Risk Factors/Patient Goals Review   Personal Goals Review Weight Management/Obesity;Improve shortness of breath with ADL's    Review Monthly review of patient's Core Components/Risk Factors/Patient Goals are as follows: Goal in progress for improving her shortness of breath with ADLs. Valeria is trying to build up her strength and endurance. She is progressing on Nustep and bike, increasing her workload, METs and speed. She is stable on room air with exertion and was happy to be able to walk around [place] with decreased shortness of breath. Goal not met for weight loss. Malonie returned to class this week after vacationing in [place]. She stated she ate and drank what she wanted. Shakeera has not implanted techniques taught to her by our dietician. She is trying not to eat out as much. She has maintained her weight since starting the program. Maila will continue to benefit from PR for nutrition, education, exercise, and lifestyle modification.    Expected Outcomes Pt will show progress toward meeting expected goals and outcomes.          ITP Comments: Pt is making expected progress toward Pulmonary Rehab goals after completing 16 session(s). Recommend continued exercise, life style modification, education, and utilization of breathing techniques to increase stamina and strength, while also decreasing shortness of breath with exertion.    Comments: Dr. Slater Staff is Medical Director for Pulmonary Rehab at [facility].

## 2024-06-14 ENCOUNTER — Encounter (HOSPITAL_COMMUNITY)
Admission: RE | Admit: 2024-06-14 | Discharge: 2024-06-14 | Disposition: A | Discharge status: Home / Self Care | Source: Ambulatory Visit | Attending: Pulmonary Disease | Admitting: Pulmonary Disease

## 2024-06-14 DIAGNOSIS — J432 Centrilobular emphysema: Secondary | ICD-10-CM | POA: Diagnosis not present

## 2024-06-14 NOTE — Progress Notes (Signed)
 Daily Session Note  Patient Details  Name: Marissa Jacobs MRN: [id number] Date of Birth: 1936/03/29 Referring Provider:   Conrad Ports Pulmonary Rehab Walk Test from 03/12/2024 in [facility] for Heart, Vascular, & Lung Health  Referring Provider Dr. Alaine    Encounter Date: 06/14/2024  Check In:  Session Check In - 06/14/24 1435       Check-In   Supervising physician immediately available to respond to emergencies [facility] MD immediately available    Physician(s) Rosaline Bane, NP    Location [facility]    Staff Present Ronal Levin, RN, BSN;Casey Claudene, Neita Moats, MS, ACSM-CEP, Exercise Physiologist;Willson Lipa Midge BS, ACSM-CEP, Exercise Physiologist    Virtual Visit No    Medication changes reported     No    Fall or balance concerns reported    No    Tobacco Cessation No Change    Warm-up and Cool-down Performed as group-led instruction    Resistance Training Performed Yes    VAD Patient? No    PAD/SET Patient? No      Pain Assessment   Currently in Pain? No/denies    Multiple Pain Sites No          Capillary Blood Glucose: No results found for this or any previous visit (from the past 24 hours).    Social History   Tobacco Use  Smoking Status Former   Current packs/day: 0.00   Types: Cigarettes   Quit date: 02/21/1963   Years since quitting: 61.3  Smokeless Tobacco Never  Tobacco Comments   in college    Goals Met:  Exercise tolerated well No report of concerns or symptoms today Strength training completed today  Goals Unmet:  Not Applicable  Comments: Service time is from 1327 to 1440.    Dr. Slater Staff is Medical Director for Pulmonary Rehab at [facility].

## 2024-06-19 ENCOUNTER — Encounter (HOSPITAL_COMMUNITY)
Admission: RE | Admit: 2024-06-19 | Discharge: 2024-06-19 | Disposition: A | Discharge status: Home / Self Care | Source: Ambulatory Visit | Attending: Pulmonary Disease | Admitting: Pulmonary Disease

## 2024-06-19 ENCOUNTER — Telehealth (HOSPITAL_BASED_OUTPATIENT_CLINIC_OR_DEPARTMENT_OTHER): Payer: Self-pay

## 2024-06-19 DIAGNOSIS — J432 Centrilobular emphysema: Secondary | ICD-10-CM | POA: Insufficient documentation

## 2024-06-19 NOTE — Telephone Encounter (Signed)
 Close

## 2024-06-19 NOTE — Progress Notes (Signed)
 Daily Session Note  Patient Details  Name: RAFAEL QUESADA MRN: [id number] Date of Birth: 08-16-1936 Referring Provider:   Conrad Ports Pulmonary Rehab Walk Test from 03/12/2024 in [facility]  Referring Provider Dr. Alaine    Encounter Date: 06/19/2024  Check In:  Session Check In - 06/19/24 1420       Check-In   Supervising physician immediately available to respond to emergencies [facility] MD immediately available    Physician(s) Rosaline Bane, NP    Location [facility]    Staff Present Ronal Levin, RN, BSN;Standley Bargo Claudene, Neita Moats, MS, ACSM-CEP, Exercise Physiologist;Randi Midge BS, ACSM-CEP, Exercise Physiologist    Virtual Visit No    Medication changes reported     No    Fall or balance concerns reported    No    Tobacco Cessation No Change    Warm-up and Cool-down Performed as group-led instruction    Resistance Training Performed Yes    VAD Patient? No    PAD/SET Patient? No      Pain Assessment   Currently in Pain? No/denies    Multiple Pain Sites No          Capillary Blood Glucose: No results found for this or any previous visit (from the past 24 hours).   Exercise Prescription Changes - 06/19/24 1400       Home Exercise Plan   Plans to continue exercise at Home (comment)   walking   Initial Home Exercises Provided 06/19/24          Social History   Tobacco Use  Smoking Status Former   Current packs/day: 0.00   Types: Cigarettes   Quit date: 02/21/1963   Years since quitting: 61.3  Smokeless Tobacco Never  Tobacco Comments   in college    Goals Met:  Proper associated with RPD/PD & O2 Sat Independence with exercise equipment Exercise tolerated well No report of concerns or symptoms today Strength training completed today  Goals Unmet:  Not Applicable  Comments: Service time is from 1320 to 1431.    Dr. Slater Staff is Medical Director for Pulmonary  Rehab at [facility].

## 2024-06-19 NOTE — Progress Notes (Signed)
 Home Exercise Prescription I have reviewed a Home Exercise Prescription with Marissa Jacobs. She is already walking 20 min 5 days a week. Congratulated pt and encouraged her to add time as able to 30 min. I also encouraged her to do 2-3 days strength training at the gym to meet her goals of continuing to be active and strong. She is motivated and ready to graduate. The patient stated that their goals were to keep being active and strong. We reviewed exercise guidelines, target heart rate during exercise, RPE Scale, weather conditions, endpoints for exercise, warmup and cool down. The patient is encouraged to come to me with any questions. I will continue to follow up with the patient to assist them with progression and safety.  Spent 15 min discussing home exercise plan and goals.  Ziair Penson [place], [place], ACSM-CEP 06/19/2024 3:03 PM

## 2024-06-20 DIAGNOSIS — R399 Unspecified symptoms and signs involving the genitourinary system: Secondary | ICD-10-CM | POA: Diagnosis not present

## 2024-06-20 DIAGNOSIS — N281 Cyst of kidney, acquired: Secondary | ICD-10-CM | POA: Diagnosis not present

## 2024-06-20 DIAGNOSIS — R351 Nocturia: Secondary | ICD-10-CM | POA: Diagnosis not present

## 2024-06-21 ENCOUNTER — Encounter (HOSPITAL_COMMUNITY)
Admission: RE | Admit: 2024-06-21 | Discharge: 2024-06-21 | Disposition: A | Source: Ambulatory Visit | Attending: Pulmonary Disease

## 2024-06-21 DIAGNOSIS — J432 Centrilobular emphysema: Secondary | ICD-10-CM | POA: Diagnosis not present

## 2024-06-21 NOTE — Progress Notes (Signed)
 Daily Session Note  Patient Details  Name: Marissa Jacobs MRN: [id number] Date of Birth: 16-Mar-1936 Referring Provider:   Conrad Ports Pulmonary Rehab Walk Test from 03/12/2024 in [facility]  Referring Provider Dr. Alaine    Encounter Date: 06/21/2024  Check In:  Session Check In - 06/21/24 1403       Check-In   Supervising physician immediately available to respond to emergencies [facility] MD immediately available    Physician(s) Orren Fabry, NP    [facility]    Staff Present Ronal Levin, RN, BSN;Casey Claudene Neita Moats, MS, ACSM-CEP, Exercise Physiologist    Virtual Visit No    Medication changes reported     No    Fall or balance concerns reported    No    Tobacco Cessation No Change    Warm-up and Cool-down Performed as group-led instruction    Resistance Training Performed Yes    VAD Patient? No    PAD/SET Patient? No      Pain Assessment   Currently in Pain? No/denies    Multiple Pain Sites No          Capillary Blood Glucose: No results found for this or any previous visit (from the past 24 hours).    Social History   Tobacco Use  Smoking Status Former   Current packs/day: 0.00   Types: Cigarettes   Quit date: 02/21/1963   Years since quitting: 61.3  Smokeless Tobacco Never  Tobacco Comments   in college    Goals Met:  Independence with exercise equipment Exercise tolerated well Queuing for purse lip breathing No report of concerns or symptoms today Strength training completed today  Goals Unmet:  Not Applicable  Comments: Service time is from 1325 to 1423    Dr. Slater Staff is Medical Director for Pulmonary Rehab at [facility].

## 2024-06-25 NOTE — Progress Notes (Unsigned)
 HPI: Follow-up atrial fibrillation, coronary artery disease, hypertension. Patient has a history of lung cancer status post left upper lobe posterior segmentectomy in January 2022, nonobstructive coronary disease by CTA, postoperative atrial fibrillation. Echocardiogram February 2011 showed normal LV function, grade 1 diastolic dysfunction, mild mitral regurgitation. Coronary CTA January 2021 showed minimal plaque in the right coronary artery, mild proximal to mid LAD stenosis, minimal plaque in the circumflex and calcium score 97.2 which is 43rd percentile. There was note of opacity in the right lower lobe and follow-up recommended 6 to 12 months. Nuclear study August 2022 showed ejection fraction 86% and normal perfusion. Patient is followed by pulmonary at [facility] and underwent bronchoscopy with transbronchial needle aspiration of right lower lobe nodule recently; pathology showed no malignancy identified. Since last seen she denies dyspnea, chest pain, palpitations or syncope.  Current Outpatient Medications  Medication Sig Dispense Refill   Ascorbic Acid (VITAMIN C) 500 MG CAPS Take 1 Capful by mouth daily.     Cholecalciferol 125 MCG (5000 UT) TABS Take 1 tablet by mouth daily.     Cobalamin Combinations (B12 FOLATE) 800-800 MCG CAPS 1 tablet Orally Once a day     Coenzyme Q10 (CO Q 10 PO) Take 1 tablet by mouth daily.     EPIPEN 2-PAK 0.3 MG/0.3ML SOAJ injection Inject 0.3 mg into the muscle as needed for anaphylaxis Chartered Certified Accountant).     estradiol  (VIVELLE -DOT) 0.025 MG/24HR Place 1 patch onto the skin 2 (two) times a week. 24 patch 3   fexofenadine (ALLEGRA) 180 MG tablet Take 180 mg by mouth daily as needed for allergies.     Grape Seed Extract 100 MG CAPS Take 1 capsule by mouth daily.     hydrochlorothiazide  (MICROZIDE ) 12.5 MG capsule Take 12.5 mg by mouth daily.     losartan  (COZAAR ) 100 MG tablet Take 1 tablet (100 mg total) by mouth daily. 90 tablet 3   MAGNESIUM GLYCINATE PO Take 1  tablet by mouth daily.     Multiple Vitamin (MULTIVITAMIN) capsule Take 1 capsule by mouth daily.     nitroGLYCERIN  (NITROSTAT ) 0.4 MG SL tablet Place 1 tablet (0.4 mg total) under the tongue every 5 (five) minutes as needed for chest pain. 30 tablet 0   Omega-3 1000 MG CAPS Take 1 capsule by mouth daily at 6 (six) AM.     Omega-3 Fatty Acids (OMEGA-3 FISH OIL PO) Take 1,600 mg by mouth daily.     omeprazole (PRILOSEC) 20 MG capsule Take 20 mg by mouth every other day.     progesterone  (PROMETRIUM ) 100 MG capsule Take 100 mg by mouth daily.     pyridoxine (B-6) 100 MG tablet Take 100 mg by mouth daily.     tretinoin (RETIN-A) 0.025 % cream Apply 1 application topically at bedtime.      vitamin k 100 MCG tablet Take 100 mcg by mouth daily. Take after taking vitamin D3.     Zinc 50 MG TABS Take 1 tablet by mouth daily.     amLODipine  (NORVASC ) 5 MG tablet Take 1 tablet (5 mg total) by mouth daily. (Patient not taking: Reported on 06/28/2024) 90 tablet 3   No current facility-administered medications for this visit.     Past Medical History:  Diagnosis Date   Anemia    secondary to taking omeprazole-on iron   Cancer (HCC) 08/2020   lung   Diverticulosis 09/2002   Dysrhythmia    a-fib after surgery 09/04/20. treated with Metoprolol   for 6 week   GERD (gastroesophageal reflux disease)    Hyperlipidemia    Hypertension    Lung nodule    Osteopenia    Post traumatic stress disorder    STD (sexually transmitted disease)    HSV    Past Surgical History:  Procedure Laterality Date   APPENDECTOMY     CERVICAL BIOPSY  W/ LOOP ELECTRODE EXCISION     CIN1   COLPOSCOPY     ESOPHAGEAL DILATION  08/17/2011   FACIAL COSMETIC SURGERY  04/16/2002   KNEE SURGERY     LUNG REMOVAL, PARTIAL Left 08/2020   no chemo or radiation needed   TONSILLECTOMY     TOTAL KNEE ARTHROPLASTY Left 06/08/2021   Procedure: TOTAL KNEE ARTHROPLASTY;  Surgeon: Melodi Lerner, MD;  Location: [facility];  Service:  Orthopedics;  Laterality: Left;    Social History   Socioeconomic History   Marital status: Divorced    Spouse name: Not on file   Number of children: 1   Years of education: Not on file   Highest education level: Some college, no degree  Occupational History   Not on file  Tobacco Use   Smoking status: Former    Current packs/day: 0.00    Types: Cigarettes    Quit date: 02/21/1963    Years since quitting: 61.3   Smokeless tobacco: Never   Tobacco comments:    in college  Vaping Use   Vaping status: Never Used  Substance and Sexual Activity   Alcohol  use: Yes    Alcohol /week: 5.0 standard drinks of alcohol     Types: 5 Standard drinks or equivalent per week   Drug use: No   Sexual activity: Yes    Partners: Male    Birth control/protection: Post-menopausal  Other Topics Concern   Not on file  Social History Narrative   Lives with significant other   Social Drivers of Health   Financial Resource Strain: Low Risk (09/05/2020)   Received [facility]   Overall Financial Resource Strain (CARDIA)    Difficulty of Paying Living Expenses: Not very hard  Food Insecurity: Low Risk  (12/08/2023)   Received from [facility]   Hunger Vital Sign    Within the past 12 months, you worried that your food would run out before you got money to buy more: Never true    Within the past 12 months, the food you bought just didn't last and you didn't have money to get more. : Never true  Transportation Needs: No Transportation Needs (12/08/2023)   Received from [facility]    In the past 12 months, has lack of reliable transportation kept you from medical appointments, meetings, work or from getting things needed for daily living? : No  Physical Activity: Not on file  Stress: Not on file  Social Connections: Not on file  Intimate Partner Violence: Not At Risk (04/22/2023)   Humiliation, Afraid, Rape, and Kick questionnaire    Fear of Current or Ex-Partner: No     Emotionally Abused: No    Physically Abused: No    Sexually Abused: No    Family History  Problem Relation Age of Onset   COPD Mother    Sudden death Father 52   Healthy Brother     ROS: no fevers or chills, productive cough, hemoptysis, dysphasia, odynophagia, melena, hematochezia, dysuria, hematuria, rash, seizure activity, orthopnea, PND, pedal edema, claudication. Remaining systems are negative.  Physical Exam: Well-developed well-nourished in  no acute distress.  Skin is warm and dry.  HEENT is normal.  Neck is supple.  Chest is clear to auscultation with normal expansion.  Cardiovascular exam is regular rate and rhythm.  Abdominal exam nontender or distended. No masses palpated. Extremities show no edema. neuro grossly intact  EKG Interpretation Date/Time:  Thursday June 28 2024 11:55:22 EST Ventricular Rate:  67 PR Interval:  144 QRS Duration:  122 QT Interval:  436 QTC Calculation: 460 R Axis:   43  Text Interpretation: Normal sinus rhythm Right bundle branch block Confirmed by Pietro Rogue ([id number]) on 06/28/2024 11:56:42 AM    A/P  1 coronary artery disease-mild on previous CTA.  She denies chest pain.  Previously declined statins.  2 history of atrial fibrillation-occurred following previous lung surgery with no documented recurrences.  3 hyperlipidemia-patient has previously declined lipid-lowering medications.  4 hypertension-blood pressure is mildly elevated based on her records from home.  I recommended addition of amlodipine  2.5 mg daily.  However she would prefer to try supplements first as she states she is allergic to multiple medications.  She will follow her blood pressure we will add amlodipine  in the future as needed.  Rogue Pietro, MD

## 2024-06-26 ENCOUNTER — Encounter (HOSPITAL_COMMUNITY)
Admission: RE | Admit: 2024-06-26 | Discharge: 2024-06-26 | Disposition: A | Discharge status: Home / Self Care | Source: Ambulatory Visit | Attending: Pulmonary Disease | Admitting: Pulmonary Disease

## 2024-06-26 VITALS — Wt 142.4 lb

## 2024-06-26 DIAGNOSIS — J432 Centrilobular emphysema: Secondary | ICD-10-CM

## 2024-06-26 NOTE — Progress Notes (Signed)
 Daily Session Note  Patient Details  Name: Marissa Jacobs MRN: [id number] Date of Birth: 01-Aug-1936 Referring Provider:   Conrad Ports Pulmonary Rehab Walk Test from 03/12/2024 in [facility]  Referring Provider Dr. Alaine    Encounter Date: 06/26/2024  Check In:  Session Check In - 06/26/24 1417       Check-In   Supervising physician immediately available to respond to emergencies [facility] MD immediately available    Physician(s) Barnie Press, NP    [facility]    Staff Present Ronal Levin, RN, BSN;Casey Smith, Neita Moats, MS, ACSM-CEP, Exercise Physiologist;Jauna Raczynski Midge BS, ACSM-CEP, Exercise Physiologist;Annedrea Violetta, RN, [place]    Virtual Visit No    Medication changes reported     No    Fall or balance concerns reported    No    Tobacco Cessation No Change    Warm-up and Cool-down Performed as group-led instruction    Resistance Training Performed Yes    VAD Patient? No    PAD/SET Patient? No      Pain Assessment   Currently in Pain? No/denies    Multiple Pain Sites No          Capillary Blood Glucose: No results found for this or any previous visit (from the past 24 hours).   Exercise Prescription Changes - 06/26/24 1500       Response to Exercise   Blood Pressure (Admit) 136/74    Blood Pressure (Exercise) 156/70    Blood Pressure (Exit) 126/66    Heart Rate (Admit) 76 bpm    Heart Rate (Exercise) 103 bpm    Heart Rate (Exit) 81 bpm    Oxygen Saturation (Admit) 98 %    Oxygen Saturation (Exercise) 95 %    Oxygen Saturation (Exit) 96 %    Rating of Perceived Exertion (Exercise) 12.5    Perceived Dyspnea (Exercise) 2    Duration Continue with 30 min of aerobic exercise without signs/symptoms of physical distress.    Intensity THRR unchanged      Progression   Progression Continue to progress workloads to maintain intensity without signs/symptoms of physical  distress.      Resistance Training   Training Prescription Yes    Weight blue bands    Reps 10-15    Time 10 Minutes      Recumbant Bike   Level 2    RPM 48    Watts 20    Minutes 15    METs 2.4      NuStep   Level 5    SPM 76    Minutes 15    METs 2.8          Social History   Tobacco Use  Smoking Status Former   Current packs/day: 0.00   Types: Cigarettes   Quit date: 02/21/1963   Years since quitting: 61.3  Smokeless Tobacco Never  Tobacco Comments   in college    Goals Met:  Independence with exercise equipment Exercise tolerated well No report of concerns or symptoms today Strength training completed today  Goals Unmet:  Not Applicable  Comments: Service time is from 1321 to 1445. She did her 6 min walk test and graduated. Successful in program.    Dr. Slater Staff is Medical Director for Pulmonary Rehab at [facility].

## 2024-06-27 NOTE — Progress Notes (Signed)
 Discharge Progress Report  Patient Details  Name: Marissa Jacobs MRN: [id number] Date of Birth: 1936-07-02 Referring Provider:   [facility] Walk Test from 03/12/2024 in [facility]  Referring Provider Dr. Alaine     Number of Visits: 20  Reason for Discharge:  Patient has met program and personal goals.  Smoking History:  Social History   Tobacco Use  Smoking Status Former   Current packs/day: 0.00   Types: Cigarettes   Quit date: 02/21/1963   Years since quitting: 61.3  Smokeless Tobacco Never  Tobacco Comments   in college    Diagnosis:  Centrilobular emphysema (HCC)  ADL [facility]:  Pulmonary Assessment Scores     Row Name 03/12/24 1513 06/14/24 1530 06/26/24 1554     ADL [facility]   ADL Phase Entry Exit Exit   SOB Score total 2 10 --     CAT Score   CAT Score 2 7 --     mMRC Score   mMRC Score 1 -- 2      Initial Exercise Prescription:  Initial Exercise Prescription - 03/12/24 1500       Date of Initial Exercise RX and Referring Provider   Date 03/12/24    Referring Provider Dr. Alaine    Expected Discharge Date 06/07/24      Recumbant Bike   Level 1    RPM 50    Watts 40    Minutes 15    METs 1.9      NuStep   Level 1    SPM 64    Minutes 15    METs 1.9      Prescription Details   Frequency (times per week) 2    Duration Progress to 30 minutes of continuous aerobic without signs/symptoms of physical distress      Intensity   THRR 40-80% of Max Heartrate 54-107    Ratings of Perceived Exertion 11-13    Perceived Dyspnea 0-4      Progression   Progression Continue to progress workloads to maintain intensity without signs/symptoms of physical distress.      Resistance Training   Training Prescription Yes    Weight red bands    Reps 10-15          Discharge Exercise Prescription (Final Exercise Prescription Changes):  Exercise Prescription Changes - 06/26/24  1500       Response to Exercise   Blood Pressure (Admit) 136/74    Blood Pressure (Exercise) 156/70    Blood Pressure (Exit) 126/66    Heart Rate (Admit) 76 bpm    Heart Rate (Exercise) 103 bpm    Heart Rate (Exit) 81 bpm    Oxygen Saturation (Admit) 98 %    Oxygen Saturation (Exercise) 95 %    Oxygen Saturation (Exit) 96 %    Rating of Perceived Exertion (Exercise) 12.5    Perceived Dyspnea (Exercise) 2    Duration Continue with 30 min of aerobic exercise without signs/symptoms of physical distress.    Intensity THRR unchanged      Progression   Progression Continue to progress workloads to maintain intensity without signs/symptoms of physical distress.      Resistance Training   Training Prescription Yes    Weight blue bands    Reps 10-15    Time 10 Minutes      Recumbant Bike   Level 2    RPM 48    Watts 20  Minutes 15    METs 2.4      NuStep   Level 5    SPM 76    Minutes 15    METs 2.8          Functional Capacity:  6 Minute Walk     Row Name 03/12/24 1518 06/26/24 1545       6 Minute Walk   Phase Initial Discharge    Distance 1096 feet 1520 feet    Distance % Change -- 28 %    Distance Feet Change -- 424 ft    Walk Time 6 minutes 6 minutes    # of Rest Breaks 0 --    MPH -- 2.88    METS 2.08 2.73    RPE 11 12.5    Perceived Dyspnea  0 2    VO2 Peak 7.79 9.56    Symptoms No No    Resting HR 71 bpm 76 bpm    Resting BP 148/66 136/74    Resting Oxygen Saturation  99 % 98 %    Exercise Oxygen Saturation  during 6 min walk 96 % 96 %    Max Ex. HR 111 bpm 114 bpm    Max Ex. BP 190/60 156/60    2 Minute Post BP 160/60 134/60      Interval HR   1 Minute HR 90 91    2 Minute HR 105 110    3 Minute HR 111 112    4 Minute HR 107 114    5 Minute HR 109 111    6 Minute HR 110 109    2 Minute Post HR 72 84    Interval Heart Rate? Yes --      Interval Oxygen   Interval Oxygen? Yes --    Baseline Oxygen Saturation % 99 % 98 %    1 Minute  Oxygen Saturation % 97 % 96 %    1 Minute Liters of Oxygen 0 L 0 L    2 Minute Oxygen Saturation % 96 % 97 %    2 Minute Liters of Oxygen 0 L 0 L    3 Minute Oxygen Saturation % 97 % 97 %    3 Minute Liters of Oxygen 0 L 0 L    4 Minute Oxygen Saturation % 96 % 98 %    4 Minute Liters of Oxygen 0 L 0 L    5 Minute Oxygen Saturation % 97 % 97 %    5 Minute Liters of Oxygen 0 L 0 L    6 Minute Oxygen Saturation % 96 % 96 %    6 Minute Liters of Oxygen 0 L 0 L    2 Minute Post Oxygen Saturation % 98 % 99 %    2 Minute Post Liters of Oxygen 0 L 0 L       Psychological, QOL, Others - Outcomes: PHQ 2/9:    06/14/2024    3:29 PM 03/12/2024    1:58 PM 07/06/2023    1:21 PM 04/22/2023    2:06 PM 06/21/2022    2:58 PM  Depression screen PHQ 2/9  Decreased Interest 0 0 0 0 0  Down, Depressed, Hopeless 0 0 0 0 0  PHQ - 2 Score 0 0 0 0 0  Altered sleeping 0 0     Tired, decreased energy 0 0     Change in appetite 0 0     Feeling bad or failure about yourself  0 0     Trouble concentrating 0 0     Moving slowly or fidgety/restless 0 0     Suicidal thoughts 0 0     PHQ-9 Score 0  0      Difficult doing work/chores  Not difficult at all        Data saved with a previous flowsheet row definition    Quality of Life:   Personal Goals: Goals established at orientation with interventions provided to work toward goal.  Personal Goals and Risk Factors at Admission - 03/12/24 1407       Core Components/Risk Factors/Patient Goals on Admission    Weight Management Weight Loss;Yes    Intervention Weight Management: Develop a combined nutrition and exercise program designed to reach desired caloric intake, while maintaining appropriate intake of nutrient and fiber, sodium and fats, and appropriate energy expenditure required for the weight goal.;Weight Management: Provide education and appropriate resources to help participant work on and attain dietary goals.;Weight Management/Obesity:  Establish reasonable short term and long term weight goals.;Obesity: Provide education and appropriate resources to help participant work on and attain dietary goals.    Admit Weight 143 lb 15.4 oz (65.3 kg)    Expected Outcomes Short Term: Continue to assess and modify interventions until short term weight is achieved;Long Term: Adherence to nutrition and physical activity/exercise program aimed toward attainment of established weight goal;Weight Loss: Understanding of general recommendations for a balanced deficit meal plan, which promotes 1-2 lb weight loss per week and includes a negative energy balance of [phone] kcal/d;Understanding recommendations for meals to include 15-35% energy as protein, 25-35% energy from fat, 35-60% energy from carbohydrates, less than 200mg  of dietary cholesterol, 20-35 gm of total fiber daily;Understanding of distribution of calorie intake throughout the day with the consumption of 4-5 meals/snacks    Improve shortness of breath with ADL's Yes    Intervention Provide education, individualized exercise plan and daily activity instruction to help decrease symptoms of SOB with activities of daily living.    Expected Outcomes Short Term: Improve cardiorespiratory fitness to achieve a reduction of symptoms when performing ADLs;Long Term: Be able to perform more ADLs without symptoms or delay the onset of symptoms           Personal Goals Discharge:  Goals and Risk Factor Review     Row Name 03/16/24 1545 04/11/24 1629 05/07/24 0943 06/11/24 1143 06/27/24 0939     Core Components/Risk Factors/Patient Goals Review   Personal Goals Review Weight Management/Obesity;Improve shortness of breath with ADL's;Develop more efficient breathing techniques such as purse lipped breathing and diaphragmatic breathing and practicing self-pacing with activity. Weight Management/Obesity;Improve shortness of breath with ADL's;Develop more efficient breathing techniques such as purse lipped  breathing and diaphragmatic breathing and practicing self-pacing with activity. Weight Management/Obesity;Improve shortness of breath with ADL's;Develop more efficient breathing techniques such as purse lipped breathing and diaphragmatic breathing and practicing self-pacing with activity. Weight Management/Obesity;Improve shortness of breath with ADL's Weight Management/Obesity;Improve shortness of breath with ADL's   Review Monthly Core Components/Risk Factors/Patient Goals Review as follows: Unable to assess. Marissa Jacobs is scheduled to start the program next week. Monthly review of patient's Core Components/Risk Factors/Patient Goals are as follows: Goal in progress for improving her shortness of breath with ADLs and developing more efficient breathing techniques such as purse lipped breathing and diaphragmatic breathing; and practicing self-pacing with activity. Staff is working with her on her breathing techniques. Her oxygen needs have been WNL on room air. She is trying  hard to build up her endurance and stamina to complete activities of daily living at home with decreased shortness of breath. Goal progressing on weight loss. Marissa Jacobs wants to lose weight but hasn't made any changes yet. Her weight has maintained since starting the program. Marissa Jacobs will continue to benefit from PR for nutrition, education, exercise, and lifestyle modification. Monthly review of patient's Core Components/Risk Factors/Patient Goals are as follows: Goal in progress for improving her shortness of breath with ADLs. Tarren is trying to build up her strength and endurance. She is progressing on Nustep and bike, increasing her workload, METs and speed. She is stable on room air with exertion. Goal met for developing more efficient breathing techniques such as purse lipped breathing and diaphragmatic breathing; and practicing self-pacing with activity. Marissa Jacobs can perform purse lipped breathing while short of breath. She demonstrated this while  performing the warmup and while exercising. She can initiate PLB on her own. She works on diaphragmatic breathing at home. Goal not met for weight loss. Marissa Jacobs has not implanted techniques taught to her by our dietician. She is trying not to eat out as much. She has maintained her weight since starting the program. Marissa Jacobs will continue to benefit from PR for nutrition, education, exercise, and lifestyle modification. Monthly review of patient's Core Components/Risk Factors/Patient Goals are as follows: Goal in progress for improving her shortness of breath with ADLs. Marissa Jacobs is trying to build up her strength and endurance. She is progressing on Nustep and bike, increasing her workload, METs and speed. She is stable on room air with exertion and was happy to be able to walk around [place] with decreased shortness of breath. Goal not met for weight loss. Marissa Jacobs returned to class this week after vacationing in [place]. She stated she ate and drank what she wanted. Marissa Jacobs has not implanted techniques taught to her by our dietician. She is trying not to eat out as much. She has maintained her weight since starting the program. Marissa Jacobs will continue to benefit from PR for nutrition, education, exercise, and lifestyle modification. 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 She did not meet her goal for weight loss. She did meet her goal for improving shortness of breath with ADL's. She was able to increase her stamina and endurance while exercising. She also stated that she is able to do more around the house with less SOB. Marissa Jacobs did great during the program and was a pleasure to work with.   Expected Outcomes Pt will show progress toward meeting expected goals and outcomes. Pt will show progress toward meeting expected goals and outcomes. Pt will show progress toward meeting expected goals and outcomes. Pt will show progress toward meeting expected goals and outcomes. To continue to exercise and modify her nutrition and lifestyle  post graduation      Exercise Goals and Review:  Exercise Goals     Row Name 03/12/24 1414             Exercise Goals   Increase Physical Activity Yes       Intervention Provide advice, education, support and counseling about physical activity/exercise needs.;Develop an individualized exercise prescription for aerobic and resistive training based on initial evaluation findings, risk stratification, comorbidities and participant's personal goals.       Expected Outcomes Short Term: Attend rehab on a regular basis to increase amount of physical activity.;Long Term: Add in home exercise to make exercise part of routine and to increase amount of physical activity.;Long Term: Exercising regularly at  least 3-5 days a week.       Increase Strength and Stamina Yes       Intervention Provide advice, education, support and counseling about physical activity/exercise needs.;Develop an individualized exercise prescription for aerobic and resistive training based on initial evaluation findings, risk stratification, comorbidities and participant's personal goals.       Expected Outcomes Short Term: Increase workloads from initial exercise prescription for resistance, speed, and METs.;Short Term: Perform resistance training exercises routinely during rehab and add in resistance training at home;Long Term: Improve cardiorespiratory fitness, muscular endurance and strength as measured by increased METs and functional capacity ( )       Able to understand and use rate of perceived exertion (RPE) scale Yes       Intervention Provide education and explanation on how to use RPE scale       Expected Outcomes Short Term: Able to use RPE daily in rehab to express subjective intensity level;Long Term:  Able to use RPE to guide intensity level when exercising independently       Able to understand and use Dyspnea scale Yes       Intervention Provide education and explanation on how to use Dyspnea scale        Expected Outcomes Short Term: Able to use Dyspnea scale daily in rehab to express subjective sense of shortness of breath during exertion;Long Term: Able to use Dyspnea scale to guide intensity level when exercising independently       Knowledge and understanding of Target Heart Rate Range (THRR) Yes       Intervention Provide education and explanation of THRR including how the numbers were predicted and where they are located for reference       Expected Outcomes Short Term: Able to state/look up THRR;Long Term: Able to use THRR to govern intensity when exercising independently;Short Term: Able to use daily as guideline for intensity in rehab       Understanding of Exercise Prescription Yes       Intervention Provide education, explanation, and written materials on patient's individual exercise prescription       Expected Outcomes Short Term: Able to explain program exercise prescription;Long Term: Able to explain home exercise prescription to exercise independently          Exercise Goals Re-Evaluation:  Exercise Goals Re-Evaluation     Row Name 03/20/24 0911 04/17/24 0844 05/08/24 0846 06/05/24 1544 06/26/24 1549     Exercise Goal Re-Evaluation   Exercise Goals Review Increase Physical Activity;Able to understand and use Dyspnea scale;Understanding of Exercise Prescription;Increase Strength and Stamina;Knowledge and understanding of Target Heart Rate Range (THRR);Able to understand and use rate of perceived exertion (RPE) scale Increase Physical Activity;Able to understand and use Dyspnea scale;Understanding of Exercise Prescription;Increase Strength and Stamina;Knowledge and understanding of Target Heart Rate Range (THRR);Able to understand and use rate of perceived exertion (RPE) scale Increase Physical Activity;Able to understand and use Dyspnea scale;Understanding of Exercise Prescription;Increase Strength and Stamina;Knowledge and understanding of Target Heart Rate Range (THRR);Able to  understand and use rate of perceived exertion (RPE) scale Increase Physical Activity;Able to understand and use Dyspnea scale;Understanding of Exercise Prescription;Increase Strength and Stamina;Knowledge and understanding of Target Heart Rate Range (THRR);Able to understand and use rate of perceived exertion (RPE) scale Increase Physical Activity;Able to understand and use Dyspnea scale;Understanding of Exercise Prescription;Increase Strength and Stamina;Knowledge and understanding of Target Heart Rate Range (THRR);Able to understand and use rate of perceived exertion (RPE) scale   Comments Pt is scheduled  to begin exercise 8/7. Will progress as tolerated. Pt has completed 5 exercise sessions, missing 2. She is exercising on the recumbent stepper for 15 min, level 3, METs 2.6. She then is exercising on the recumbent bike for 15 min, level 2, METs 2.6. She tried level 3 last session but felt it was too hard. She performs warm up and cool down without significant limitations, using blue bands, 5.8 lbs. Will progress as tolerated. Pt has completed 10 exercise sessions, missing 2. She is exercising on the recumbent stepper for 15 min, level 4, METs 2.6. She then is exercising on the recumbent bike for 15 min, level 2, METs 2.6. Level 3 is too hard for her. She performs warm up and cool down without significant limitations, using blue bands, 5.8 lbs. Will progress as tolerated. Pt has completed 13 exercise sessions, missing 2 weeks recently due to travel. She is exercising on the recumbent stepper for 15 min, level 4, METs 3. She then is exercising on the recumbent bike for 15 min, level 2-3, METs 2.5. She walked daily on vacation and was able to maintain her METs when she returned. She performs warm up and cool down without significant limitations, using blue bands, 5.8 lbs. Will progress as tolerated. Pt completed 20 exercise sessions. She exercised on the recumbent stepper for 15 min, level 5, METs 2.8. She  also  exercised on the recumbent bike for 15 min, level 2-3, METs 2.5. She increased her 6 min walk test by 28%, 424 ft. She tolerated the program well and plans to continue walking t home for exercise. She also wants to work on runner, broadcasting/film/video at [employer]   Expected Outcomes Through exercise at rehab and home, the patient will decrease shortness of breath and feel confident in carrying out an exercise regimen at home. Through exercise at rehab and home, the patient will decrease shortness of breath and feel confident in carrying out an exercise regimen at home. Through exercise at rehab and home, the patient will decrease shortness of breath and feel confident in carrying out an exercise regimen at home. Through exercise at rehab and home, the patient will decrease shortness of breath and feel confident in carrying out an exercise regimen at home. Through exercise at rehab and home, the patient will decrease shortness of breath and feel confident in carrying out an exercise regimen at home.      Nutrition & Weight - Outcomes:  Pre Biometrics - 03/12/24 1513       Pre Biometrics   Grip Strength 20 kg          Post Biometrics - 06/26/24 1555        Post  Biometrics   Grip Strength 14 kg          Nutrition:  Nutrition Therapy & Goals - 03/22/24 1439       Nutrition Therapy   Diet General Healthy Diet      Personal Nutrition Goals   Nutrition Goal Patient to improve diet quality by using the plate method as a guide for meal planning to include lean protein/plant protein, fruits, vegetables, whole grains, nonfat dairy as part of a well-balanced diet.    Comments Daylani has medical hisory of HTN, hyperlipidemia, lung cancer. She follows a normal diet. Per documentation with cardiology on 08/23/23, she declines statin. BMI remains appropriate for age. Patient will benefit from participation in pulmonary rehab for nutrition education, exercise, and lifestyle modification.      Intervention Plan  Intervention Prescribe, educate and counsel regarding individualized specific dietary modifications aiming towards targeted core components such as weight, hypertension, lipid management, diabetes, heart failure and other comorbidities.;Nutrition handout(s) given to patient.    Expected Outcomes Short Term Goal: Understand basic principles of dietary content, such as calories, fat, sodium, cholesterol and nutrients.;Long Term Goal: Adherence to prescribed nutrition plan.          Nutrition Discharge:  Nutrition Assessments - 03/29/24 0922       Rate Your Plate Scores   Pre Score 70          Education Questionnaire Score:  Knowledge Questionnaire Score - 06/14/24 1531       Knowledge Questionnaire Score   Pre Score 17/18    Post Score 17/18          Goals reviewed with patient; copy given to patient.

## 2024-06-28 ENCOUNTER — Encounter: Payer: Self-pay | Admitting: Cardiology

## 2024-06-28 ENCOUNTER — Ambulatory Visit: Attending: Cardiology | Admitting: Cardiology

## 2024-06-28 VITALS — BP 140/62 | HR 68 | Ht 61.0 in | Wt 143.0 lb

## 2024-06-28 DIAGNOSIS — I10 Essential (primary) hypertension: Secondary | ICD-10-CM

## 2024-06-28 DIAGNOSIS — E7849 Other hyperlipidemia: Secondary | ICD-10-CM | POA: Diagnosis not present

## 2024-06-28 DIAGNOSIS — I2510 Atherosclerotic heart disease of native coronary artery without angina pectoris: Secondary | ICD-10-CM

## 2024-06-28 DIAGNOSIS — E041 Nontoxic single thyroid nodule: Secondary | ICD-10-CM | POA: Diagnosis not present

## 2024-06-28 DIAGNOSIS — E559 Vitamin D deficiency, unspecified: Secondary | ICD-10-CM | POA: Diagnosis not present

## 2024-06-28 DIAGNOSIS — E785 Hyperlipidemia, unspecified: Secondary | ICD-10-CM | POA: Diagnosis not present

## 2024-06-28 DIAGNOSIS — E782 Mixed hyperlipidemia: Secondary | ICD-10-CM

## 2024-06-28 NOTE — Patient Instructions (Signed)

## 2024-07-03 ENCOUNTER — Other Ambulatory Visit: Payer: Self-pay | Admitting: Internal Medicine

## 2024-07-03 DIAGNOSIS — Z1231 Encounter for screening mammogram for malignant neoplasm of breast: Secondary | ICD-10-CM

## 2024-07-05 DIAGNOSIS — N2889 Other specified disorders of kidney and ureter: Secondary | ICD-10-CM | POA: Diagnosis not present

## 2024-07-05 DIAGNOSIS — L509 Urticaria, unspecified: Secondary | ICD-10-CM | POA: Diagnosis not present

## 2024-07-05 DIAGNOSIS — I10 Essential (primary) hypertension: Secondary | ICD-10-CM | POA: Diagnosis not present

## 2024-07-05 DIAGNOSIS — Z1339 Encounter for screening examination for other mental health and behavioral disorders: Secondary | ICD-10-CM | POA: Diagnosis not present

## 2024-07-05 DIAGNOSIS — E785 Hyperlipidemia, unspecified: Secondary | ICD-10-CM | POA: Diagnosis not present

## 2024-07-05 DIAGNOSIS — M8580 Other specified disorders of bone density and structure, unspecified site: Secondary | ICD-10-CM | POA: Diagnosis not present

## 2024-07-05 DIAGNOSIS — C3492 Malignant neoplasm of unspecified part of left bronchus or lung: Secondary | ICD-10-CM | POA: Diagnosis not present

## 2024-07-05 DIAGNOSIS — K219 Gastro-esophageal reflux disease without esophagitis: Secondary | ICD-10-CM | POA: Diagnosis not present

## 2024-07-05 DIAGNOSIS — I700 Atherosclerosis of aorta: Secondary | ICD-10-CM | POA: Diagnosis not present

## 2024-07-05 DIAGNOSIS — G4700 Insomnia, unspecified: Secondary | ICD-10-CM | POA: Diagnosis not present

## 2024-07-05 DIAGNOSIS — Z1331 Encounter for screening for depression: Secondary | ICD-10-CM | POA: Diagnosis not present

## 2024-07-05 DIAGNOSIS — Z0000 Encounter for general adult medical examination without abnormal findings: Secondary | ICD-10-CM | POA: Diagnosis not present

## 2024-07-05 DIAGNOSIS — Z79890 Hormone replacement therapy (postmenopausal): Secondary | ICD-10-CM | POA: Diagnosis not present

## 2024-07-05 DIAGNOSIS — R82998 Other abnormal findings in urine: Secondary | ICD-10-CM | POA: Diagnosis not present

## 2024-07-05 DIAGNOSIS — F419 Anxiety disorder, unspecified: Secondary | ICD-10-CM | POA: Diagnosis not present

## 2024-07-05 DIAGNOSIS — I2584 Coronary atherosclerosis due to calcified coronary lesion: Secondary | ICD-10-CM | POA: Diagnosis not present

## 2024-07-05 DIAGNOSIS — J439 Emphysema, unspecified: Secondary | ICD-10-CM | POA: Diagnosis not present

## 2024-07-10 ENCOUNTER — Ambulatory Visit (HOSPITAL_BASED_OUTPATIENT_CLINIC_OR_DEPARTMENT_OTHER): Payer: Medicare HMO | Admitting: Obstetrics & Gynecology

## 2024-07-17 ENCOUNTER — Ambulatory Visit (HOSPITAL_BASED_OUTPATIENT_CLINIC_OR_DEPARTMENT_OTHER): Admitting: Obstetrics & Gynecology

## 2024-07-17 DIAGNOSIS — C3492 Malignant neoplasm of unspecified part of left bronchus or lung: Secondary | ICD-10-CM | POA: Diagnosis not present

## 2024-07-17 DIAGNOSIS — J90 Pleural effusion, not elsewhere classified: Secondary | ICD-10-CM | POA: Diagnosis not present

## 2024-07-17 DIAGNOSIS — R918 Other nonspecific abnormal finding of lung field: Secondary | ICD-10-CM | POA: Diagnosis not present

## 2024-07-22 ENCOUNTER — Other Ambulatory Visit (HOSPITAL_BASED_OUTPATIENT_CLINIC_OR_DEPARTMENT_OTHER): Payer: Self-pay | Admitting: Obstetrics & Gynecology

## 2024-07-23 ENCOUNTER — Ambulatory Visit (HOSPITAL_BASED_OUTPATIENT_CLINIC_OR_DEPARTMENT_OTHER): Admitting: Obstetrics & Gynecology

## 2024-07-23 DIAGNOSIS — Q828 Other specified congenital malformations of skin: Secondary | ICD-10-CM | POA: Diagnosis not present

## 2024-07-30 DIAGNOSIS — C3492 Malignant neoplasm of unspecified part of left bronchus or lung: Secondary | ICD-10-CM | POA: Diagnosis not present

## 2024-07-30 DIAGNOSIS — R918 Other nonspecific abnormal finding of lung field: Secondary | ICD-10-CM | POA: Diagnosis not present

## 2024-08-06 ENCOUNTER — Inpatient Hospital Stay: Admission: RE | Admit: 2024-08-06 | Discharge: 2024-08-06 | Attending: Internal Medicine | Admitting: Internal Medicine

## 2024-08-06 DIAGNOSIS — Z1231 Encounter for screening mammogram for malignant neoplasm of breast: Secondary | ICD-10-CM

## 2024-09-11 ENCOUNTER — Ambulatory Visit (HOSPITAL_BASED_OUTPATIENT_CLINIC_OR_DEPARTMENT_OTHER): Admitting: Obstetrics & Gynecology

## 2024-09-11 ENCOUNTER — Ambulatory Visit (INDEPENDENT_AMBULATORY_CARE_PROVIDER_SITE_OTHER): Admitting: Obstetrics & Gynecology

## 2024-09-11 ENCOUNTER — Encounter (HOSPITAL_BASED_OUTPATIENT_CLINIC_OR_DEPARTMENT_OTHER): Payer: Self-pay | Admitting: Obstetrics & Gynecology

## 2024-09-11 VITALS — BP 138/67 | HR 73

## 2024-09-11 DIAGNOSIS — A6004 Herpesviral vulvovaginitis: Secondary | ICD-10-CM | POA: Diagnosis not present

## 2024-09-11 DIAGNOSIS — Z79890 Hormone replacement therapy (postmenopausal): Secondary | ICD-10-CM | POA: Diagnosis not present

## 2024-09-11 DIAGNOSIS — C3431 Malignant neoplasm of lower lobe, right bronchus or lung: Secondary | ICD-10-CM

## 2024-09-11 DIAGNOSIS — M85851 Other specified disorders of bone density and structure, right thigh: Secondary | ICD-10-CM | POA: Diagnosis not present

## 2024-09-11 DIAGNOSIS — R918 Other nonspecific abnormal finding of lung field: Secondary | ICD-10-CM

## 2024-09-11 DIAGNOSIS — Z01419 Encounter for gynecological examination (general) (routine) without abnormal findings: Secondary | ICD-10-CM

## 2024-09-11 DIAGNOSIS — M85852 Other specified disorders of bone density and structure, left thigh: Secondary | ICD-10-CM | POA: Diagnosis not present

## 2024-09-11 DIAGNOSIS — Z9189 Other specified personal risk factors, not elsewhere classified: Secondary | ICD-10-CM

## 2024-09-11 MED ORDER — ESTRADIOL 0.025 MG/24HR TD PTTW: 1.0000 | MEDICATED_PATCH | TRANSDERMAL | 3 refills | Status: AC

## 2024-09-11 MED ORDER — PROMETRIUM 100 MG PO CAPS: 100.0000 mg | ORAL_CAPSULE | Freq: Every day | ORAL | 4 refills | Status: AC

## 2024-09-11 NOTE — Progress Notes (Unsigned)
 "  Breast and Pelvic Exam Patient name: Marissa Jacobs MRN [id number]  Date of birth: 04-30-1936 Chief Complaint:   Gynecologic Exam  History of Present Illness:   Marissa Jacobs is a 89 y.o. G1P1011 Caucasian female being seen today for breast and pelvic exam.  Denis vaginal bleeding.  On low dose estradiol  patch.  Desires to continue.  We have reviewed risks and she is completely sure she desires to continue.    H/o lung ca and is going to [facility] on March 3rd for repeat scan of lungs. She reports that they are watching a new nodule in her right lower quadrant that is a ground glass nodule.  Most recent lung CT 07/17/2024.  She reports that she just finished a three month pulmonary rehab class.  H/o wedge resection on left lobe 08/2020.  Has consult with radiation oncologist with [facility] who is in [place].  She has virtual consult on Thursday to discuss possible options with this proton beam therapy.  This is with Dr. Vergia Cobble.    Patient reports that she is still having issues with nocturia and using the bathroom about every two hours at night.   Patient's last menstrual period was 08/16/1993.  Last pap: 07/06/2023. Results were: NILM w/ HRHPV not done. H/O abnormal pap: yes Last mammogram: 08/06/2024. Results were: normal. Family h/o breast cancer: no Last colonoscopy: 07/27/2019. Results were: normal. Family h/o colorectal cancer: no DEXA:  06/18/2023 T  score -2.0     06/14/2024    3:29 PM 03/12/2024    1:58 PM 07/06/2023    1:21 PM 04/22/2023    2:06 PM 06/21/2022    2:58 PM  Depression screen PHQ 2/9  Decreased Interest 0 0 0 0 0  Down, Depressed, Hopeless 0 0 0 0 0  PHQ - 2 Score 0 0 0 0 0  Altered sleeping 0 0     Tired, decreased energy 0 0     Change in appetite 0 0     Feeling bad or failure about yourself  0 0     Trouble concentrating 0 0     Moving slowly or fidgety/restless 0 0     Suicidal thoughts 0 0     PHQ-9 Score 0  0      Difficult doing work/chores  Not  difficult at all        Data saved with a previous flowsheet row definition    Review of Systems:   Pertinent items are noted in HPI Denies any vaginal bleeding, pelvic pain, bowel changes.  She is still having nocturia. Pertinent History Reviewed:  Reviewed past medical,surgical, social and family history.  Reviewed problem list, medications and allergies. Physical Assessment:   Vitals:   09/11/24 1549  BP: 138/67  Pulse: 73  SpO2: 100%  There is no height or weight on file to calculate BMI.        Physical Examination:   General appearance - well appearing, and in no distress  Mental status - alert, oriented to person, place, and time  Psych:  She has a normal mood and affect  Skin - warm and dry, normal color, no suspicious lesions noted  Chest - effort normal, all lung fields clear to auscultation bilaterally  Heart - normal rate and regular rhythm  Neck:  midline trachea, no thyromegaly or nodules  Breasts - breasts appear normal, no suspicious masses, no skin or nipple changes or  axillary nodes  Abdomen - soft, nontender,  nondistended, no masses or organomegaly  Pelvic - VULVA: normal appearing vulva with no masses, tenderness or lesions   VAGINA: normal appearing vagina with normal color and discharge, no lesions   CERVIX: normal appearing cervix without discharge or lesions, no CMT  Thin prep pap is not indicated  UTERUS: uterus is felt to be normal size, shape, consistency and nontender   ADNEXA: No adnexal masses or tenderness noted.  Rectal - normal rectal, good sphincter tone, no masses felt  Extremities:  No swelling or varicosities noted  Chaperone present for exam  No results found for this or any previous visit (from the past 24 hours).  Assessment & Plan:  1. GYN exam for high-risk Medicare patient (Primary) - Pap smear 2024.  Pt does desire to continue. - Mammogram 08/06/2024 - Colonoscopy 2020.  Not routine screening follow up recommended - Bone  mineral density 06/18/2023 - lab work done with PCP, Dr. Onita - vaccines reviewed/updated  2. 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  Has upcoming consult with [facility] provider in [place] regarding new proton beam therapy  3. Hormone replacement therapy (HRT) - does Rx for brand only prometrium  100mg .  She is looking into locations where she may be able to get this with less cost - estradiol  (VIVELLE -DOT) 0.025 MG/24HR; Place 1 patch onto the skin 2 (two) times a week.  Dispense: 24 patch; Refill: 3 - pt does not want to stop HRT.  Aware of risks.  4. 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  Last DEXA 2024.  Does not need updating this year.  5.  H/o HSV, vulvar  No orders of the defined types were placed in this encounter.   Meds:  Meds ordered this encounter  Medications   estradiol  (VIVELLE -DOT) 0.025 MG/24HR    Sig: Place 1 patch onto the skin 2 (two) times a week.    Dispense:  24 patch    Refill:  3   PROMETRIUM  100 MG capsule    Sig: Take 1 capsule (100 mg total) by mouth daily.    Dispense:  90 capsule    Refill:  4    Follow-up: Return in about 1 year (around 09/11/2025).  Ronal GORMAN Pinal, MD 09/12/2024 7:23 AM "

## 2024-09-11 NOTE — Progress Notes (Unsigned)
 "  ANNUAL EXAM Patient name: Marissa Jacobs MRN [id number]  Date of birth: 1936/07/30 Chief Complaint:   No chief complaint on file.  History of Present Illness:   Marissa Jacobs is a 89 y.o. G77P1011 Caucasian female being seen today for a routine annual exam.  Current complaints: ***  Patient's last menstrual period was 08/16/1993.   The pregnancy intention screening data noted above was reviewed. Potential methods of contraception were discussed. The patient elected to proceed with No data recorded.   Last pap 07/06/2023. Results were: NILM w/ HRHPV not done. H/O abnormal pap: {yes/yes***/no:23866} Last mammogram: 08/06/2024. Results were: normal. Family h/o breast cancer: {yes***/no:23838} Last colonoscopy: 07/27/2019. Results were: abnormal diverticulosis. Family h/o colorectal cancer: {yes***/no:23838}     06/14/2024    3:29 PM 03/12/2024    1:58 PM 07/06/2023    1:21 PM 04/22/2023    2:06 PM 06/21/2022    2:58 PM  Depression screen PHQ 2/9  Decreased Interest 0 0 0 0 0  Down, Depressed, Hopeless 0 0 0 0 0  PHQ - 2 Score 0 0 0 0 0  Altered sleeping 0 0     Tired, decreased energy 0 0     Change in appetite 0 0     Feeling bad or failure about yourself  0 0     Trouble concentrating 0 0     Moving slowly or fidgety/restless 0 0     Suicidal thoughts 0 0     PHQ-9 Score 0  0      Difficult doing work/chores  Not difficult at all        Data saved with a previous flowsheet row definition         No data to display           Review of Systems:   Pertinent items are noted in HPI Denies any headaches, blurred vision, fatigue, shortness of breath, chest pain, abdominal pain, abnormal vaginal discharge/itching/odor/irritation, problems with periods, bowel movements, urination, or intercourse unless otherwise stated above. Pertinent History Reviewed:  Reviewed past medical,surgical, social and family history.  Reviewed problem list, medications and allergies. Physical  Assessment:  There were no vitals filed for this visit.There is no height or weight on file to calculate BMI.        Physical Examination:   General appearance - well appearing, and in no distress  Mental status - alert, oriented to person, place, and time  Psych:  She has a normal mood and affect  Skin - warm and dry, normal color, no suspicious lesions noted  Chest - effort normal, all lung fields clear to auscultation bilaterally  Heart - normal rate and regular rhythm  Neck:  midline trachea, no thyromegaly or nodules  Breasts - breasts appear normal, no suspicious masses, no skin or nipple changes or  axillary nodes  Abdomen - soft, nontender, nondistended, no masses or organomegaly  Pelvic - VULVA: normal appearing vulva with no masses, tenderness or lesions  VAGINA: normal appearing vagina with normal color and discharge, no lesions  CERVIX: normal appearing cervix without discharge or lesions, no CMT  Thin prep pap is {Desc; done/not:10129} *** HR HPV cotesting  UTERUS: uterus is felt to be normal size, shape, consistency and nontender   ADNEXA: No adnexal masses or tenderness noted.  Rectal - normal rectal, good sphincter tone, no masses felt. Hemoccult: ***  Extremities:  No swelling or varicosities noted  Chaperone present for exam  No results  found for this or any previous visit (from the past 24 hours).  Assessment & Plan:  1) Well-Woman Exam  2) ***  Labs/procedures today: ***  Mammogram: {Mammo f/u:25212::@ 89yo}, or sooner if problems Colonoscopy: {TCS f/u:25213::@ 89yo}, or sooner if problems  No orders of the defined types were placed in this encounter.   Meds: No orders of the defined types were placed in this encounter.   Follow-up: No follow-ups on file.  Abdelrahman Nair E, RN 09/11/2024 10:17 AM "

## 2024-09-21 ENCOUNTER — Other Ambulatory Visit (HOSPITAL_BASED_OUTPATIENT_CLINIC_OR_DEPARTMENT_OTHER): Payer: Self-pay

## 2024-09-27 ENCOUNTER — Ambulatory Visit (HOSPITAL_BASED_OUTPATIENT_CLINIC_OR_DEPARTMENT_OTHER): Admitting: Obstetrics & Gynecology

## 2024-10-31 ENCOUNTER — Ambulatory Visit (HOSPITAL_BASED_OUTPATIENT_CLINIC_OR_DEPARTMENT_OTHER): Admitting: Obstetrics & Gynecology
